# Patient Record
Sex: Female | Born: 1945 | Race: White | Hispanic: No | Marital: Single | Smoking: Former smoker
Health system: Southern US, Community
[De-identification: ages and names within clinical notes are randomized; demographics above are authoritative.]

## PROBLEM LIST (undated history)

## (undated) DIAGNOSIS — C4490 Unspecified malignant neoplasm of skin, unspecified: Secondary | ICD-10-CM

## (undated) DIAGNOSIS — R55 Syncope and collapse: Secondary | ICD-10-CM

## (undated) DIAGNOSIS — F419 Anxiety disorder, unspecified: Secondary | ICD-10-CM

## (undated) DIAGNOSIS — M1990 Unspecified osteoarthritis, unspecified site: Secondary | ICD-10-CM

## (undated) DIAGNOSIS — R569 Unspecified convulsions: Secondary | ICD-10-CM

## (undated) DIAGNOSIS — F32A Depression, unspecified: Secondary | ICD-10-CM

## (undated) DIAGNOSIS — H269 Unspecified cataract: Secondary | ICD-10-CM

## (undated) DIAGNOSIS — I10 Essential (primary) hypertension: Secondary | ICD-10-CM

## (undated) DIAGNOSIS — Z923 Personal history of irradiation: Secondary | ICD-10-CM

## (undated) DIAGNOSIS — F329 Major depressive disorder, single episode, unspecified: Secondary | ICD-10-CM

## (undated) DIAGNOSIS — K219 Gastro-esophageal reflux disease without esophagitis: Secondary | ICD-10-CM

## (undated) DIAGNOSIS — R9082 White matter disease, unspecified: Secondary | ICD-10-CM

## (undated) HISTORY — PX: EYE SURGERY: SHX253

## (undated) HISTORY — DX: Anxiety disorder, unspecified: F41.9

## (undated) HISTORY — DX: Major depressive disorder, single episode, unspecified: F32.9

## (undated) HISTORY — DX: Syncope and collapse: R55

## (undated) HISTORY — DX: White matter disease, unspecified: R90.82

## (undated) HISTORY — DX: Unspecified cataract: H26.9

## (undated) HISTORY — PX: COLONOSCOPY: SHX174

## (undated) HISTORY — DX: Unspecified malignant neoplasm of skin, unspecified: C44.90

## (undated) HISTORY — DX: Depression, unspecified: F32.A

## (undated) HISTORY — PX: BREAST SURGERY: SHX581

## (undated) HISTORY — DX: Unspecified osteoarthritis, unspecified site: M19.90

## (undated) HISTORY — DX: Unspecified convulsions: R56.9

## (undated) HISTORY — DX: Essential (primary) hypertension: I10

---

## 1997-10-12 ENCOUNTER — Other Ambulatory Visit: Admission: RE | Admit: 1997-10-12 | Discharge: 1997-10-12 | Payer: Self-pay | Admitting: Obstetrics and Gynecology

## 1998-06-28 ENCOUNTER — Encounter: Admission: RE | Admit: 1998-06-28 | Discharge: 1998-06-28 | Payer: Self-pay | Admitting: Sports Medicine

## 1998-07-19 ENCOUNTER — Encounter: Admission: RE | Admit: 1998-07-19 | Discharge: 1998-07-19 | Payer: Self-pay | Admitting: Sports Medicine

## 1998-08-18 ENCOUNTER — Encounter: Admission: RE | Admit: 1998-08-18 | Discharge: 1998-08-18 | Payer: Self-pay | Admitting: Family Medicine

## 1998-11-24 ENCOUNTER — Other Ambulatory Visit: Admission: RE | Admit: 1998-11-24 | Discharge: 1998-11-24 | Payer: Self-pay | Admitting: *Deleted

## 2000-02-08 ENCOUNTER — Other Ambulatory Visit: Admission: RE | Admit: 2000-02-08 | Discharge: 2000-02-08 | Payer: Self-pay | Admitting: *Deleted

## 2001-04-09 ENCOUNTER — Other Ambulatory Visit: Admission: RE | Admit: 2001-04-09 | Discharge: 2001-04-09 | Payer: Self-pay | Admitting: *Deleted

## 2002-10-29 ENCOUNTER — Other Ambulatory Visit: Admission: RE | Admit: 2002-10-29 | Discharge: 2002-10-29 | Payer: Self-pay | Admitting: Obstetrics and Gynecology

## 2003-12-25 ENCOUNTER — Emergency Department (HOSPITAL_COMMUNITY): Admission: EM | Admit: 2003-12-25 | Discharge: 2003-12-25 | Payer: Self-pay | Admitting: Emergency Medicine

## 2004-09-06 ENCOUNTER — Ambulatory Visit: Payer: Self-pay | Admitting: Internal Medicine

## 2004-09-12 ENCOUNTER — Ambulatory Visit: Payer: Self-pay

## 2004-10-12 ENCOUNTER — Ambulatory Visit: Payer: Self-pay | Admitting: Gastroenterology

## 2004-10-24 ENCOUNTER — Ambulatory Visit: Payer: Self-pay | Admitting: Gastroenterology

## 2004-11-01 ENCOUNTER — Ambulatory Visit: Payer: Self-pay | Admitting: Internal Medicine

## 2004-11-03 ENCOUNTER — Ambulatory Visit: Payer: Self-pay | Admitting: Internal Medicine

## 2006-01-29 ENCOUNTER — Ambulatory Visit: Payer: Self-pay | Admitting: Internal Medicine

## 2007-10-20 ENCOUNTER — Encounter: Payer: Self-pay | Admitting: Internal Medicine

## 2009-09-30 ENCOUNTER — Encounter (INDEPENDENT_AMBULATORY_CARE_PROVIDER_SITE_OTHER): Payer: Self-pay | Admitting: *Deleted

## 2010-06-13 NOTE — Letter (Signed)
Summary: Colonoscopy Letter  [facility]  [address] [place], [place] [address]   Phone: [phone]  Fax: [phone]      Sep 30, 2009 MRN: [id number]   Marcia Harvey [address] [place], [place]  [address]   Dear Ms. Scaturro,   According to your medical record, it is time for you to schedule a Colonoscopy. The American Cancer Society recommends this procedure as a method to detect early colon cancer. Patients with a family history of colon cancer, or a personal history of colon polyps or inflammatory bowel disease are at increased risk.  This letter has been generated based on the recommendations made at the time of your procedure. If you feel that in your particular situation this may no longer apply, please contact our office.  Please call our office at [phone] to schedule this appointment or to update your records at your earliest convenience.  Thank you for cooperating with [place] to provide you with the very best care possible.   Sincerely,  Barbette Hair. Arlyce Dice, M.D.  [facility] [phone]

## 2010-08-21 IMAGING — CR DG LUMBAR SPINE COMPLETE 4+V
2 series · 5 of 5 positions shown · non-contrast
Comparison: NONE

CLINICAL DATA: Low back pain for 2 months. 

LUMBAR SPINE SERIES

[Series 1: view not recorded · 0.17mm/px · 4 of 4 slices shown (1 of 2)]
[im 1/4]
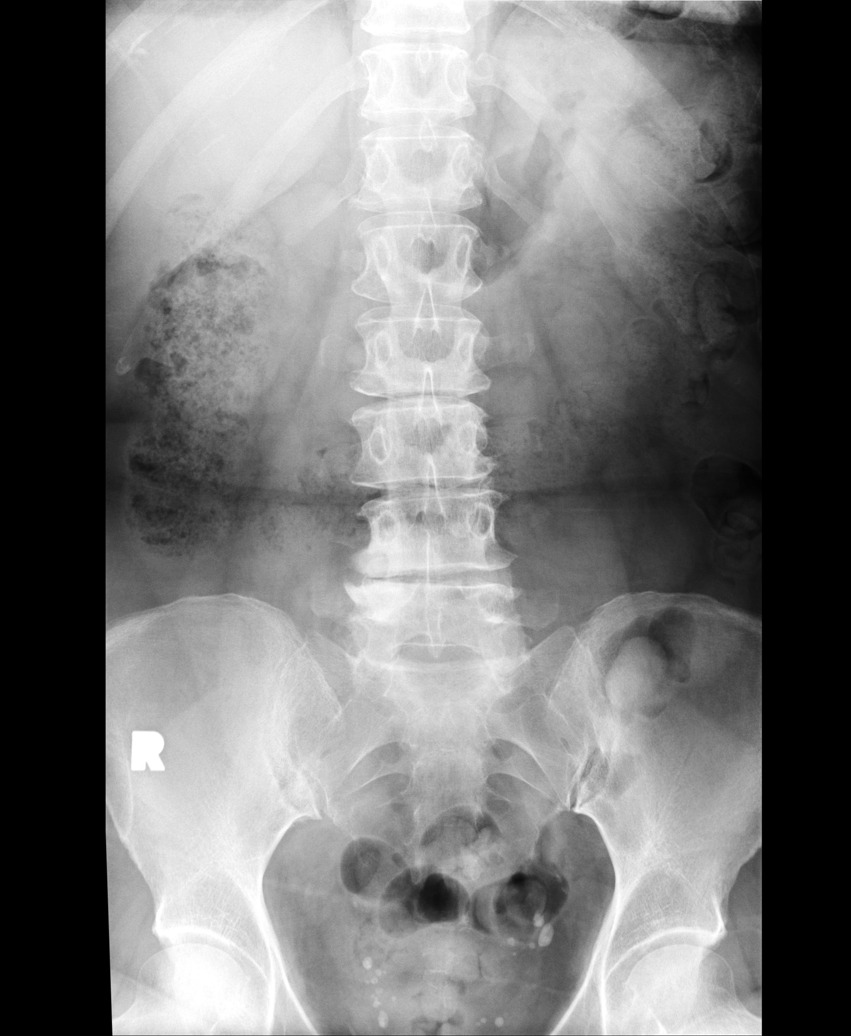
[im 2/4]
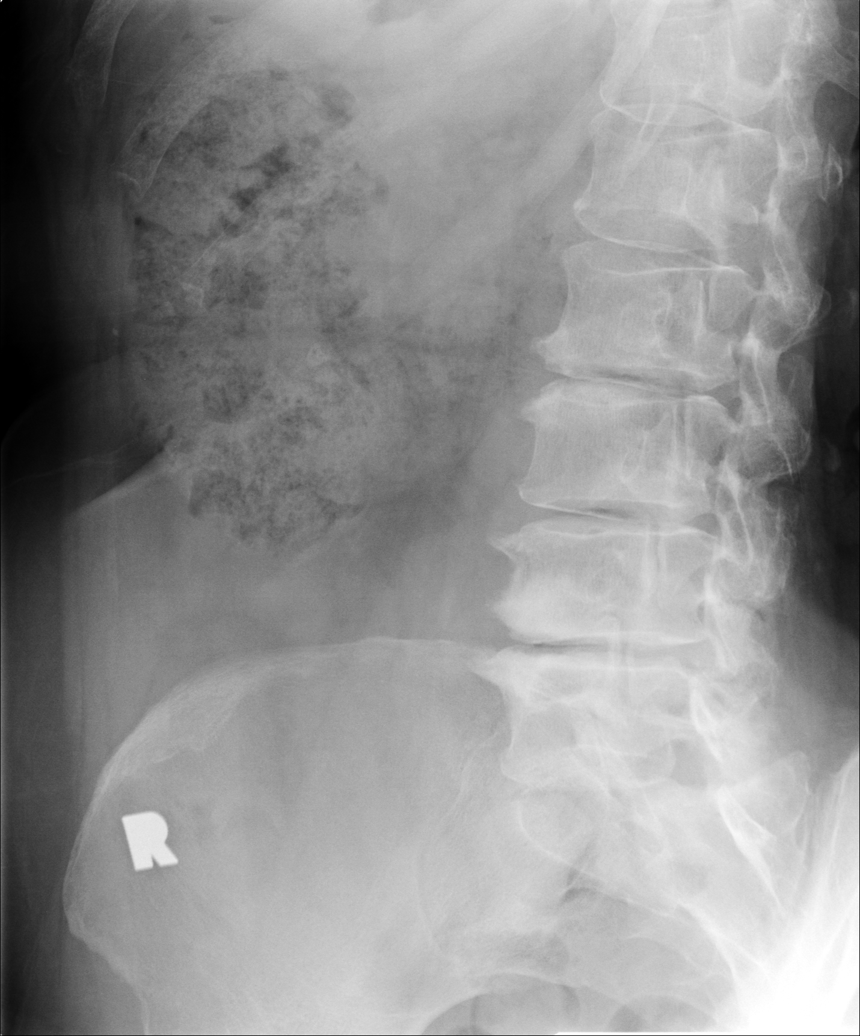
[im 3/4]
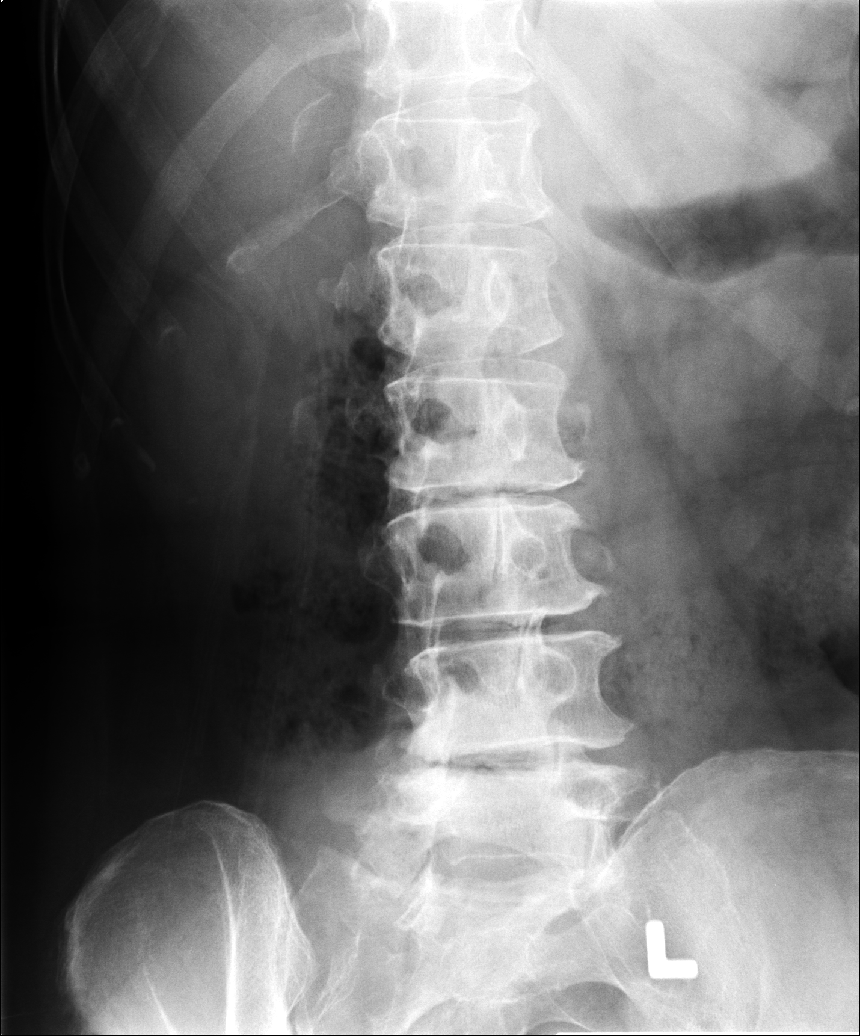
[im 4/4]
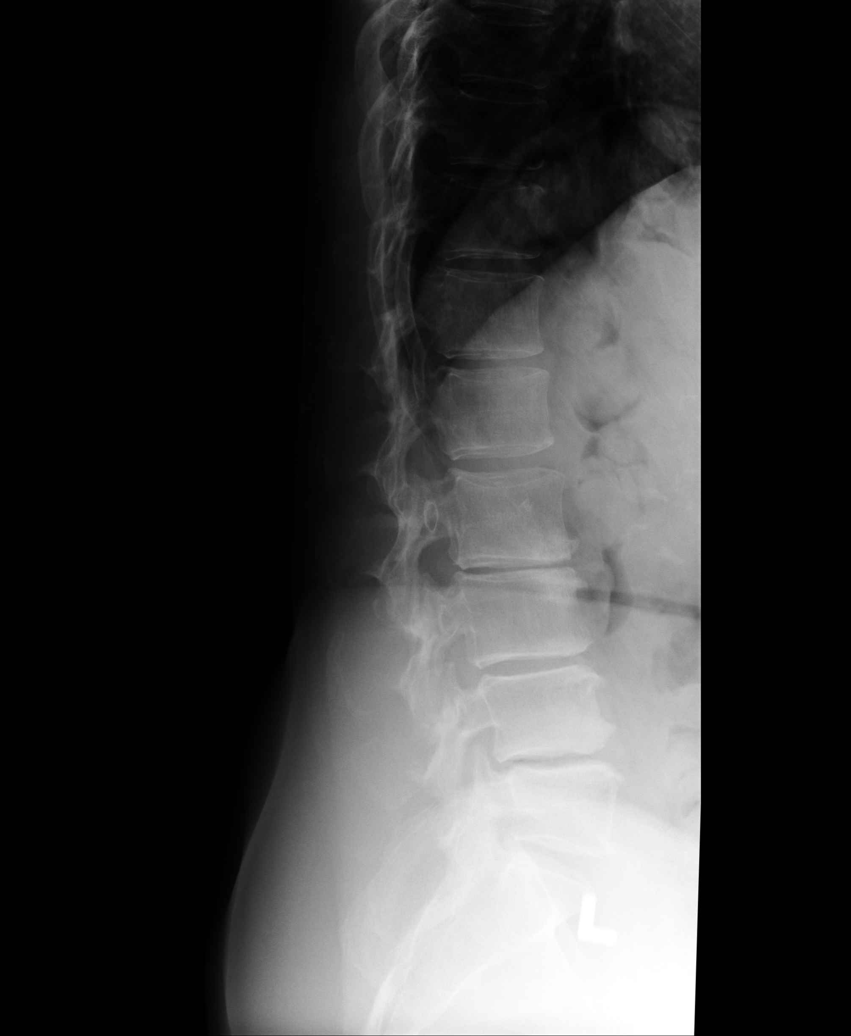

[view not recorded (2 of 2)]
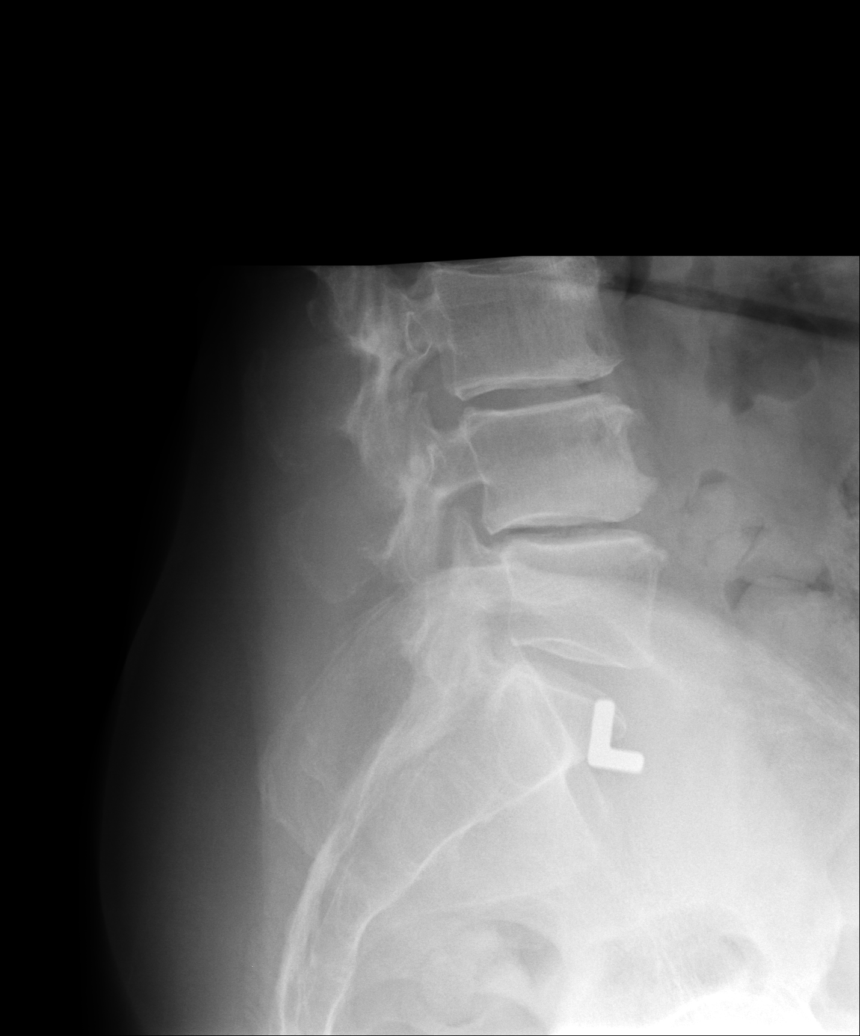

[5 of 5 positions shown; findings below may reference images not displayed]

FINDINGS: Multi-level moderate to marked disc space narrowing is 
present in the lumbar spine. The pedicles are intact. No 
compression fractures, lytic or blastic lesions. No pars defects.
IMPRESSION: Moderate lumbar spondylosis. Lamanna, Tandy 
electronically reviewed on 05/05/2008 Dict Date: 05/05/2008  Tran 
Date:  05/05/2008 DAS  [REDACTED]

## 2010-12-25 ENCOUNTER — Telehealth: Payer: Self-pay | Admitting: *Deleted

## 2010-12-25 NOTE — Telephone Encounter (Signed)
LM FOR PT TO RETURN CALL.  °

## 2011-01-01 NOTE — Telephone Encounter (Signed)
Patient wants to call back in November to schedule her colonoscopy. Said she has been busy and has forgotten about her recall.

## 2011-03-21 ENCOUNTER — Ambulatory Visit (AMBULATORY_SURGERY_CENTER): Payer: Medicare Other | Admitting: *Deleted

## 2011-03-21 VITALS — Ht 64.0 in | Wt 168.9 lb

## 2011-03-21 DIAGNOSIS — Z1211 Encounter for screening for malignant neoplasm of colon: Secondary | ICD-10-CM

## 2011-03-21 MED ORDER — MOVIPREP 100 G PO SOLR: ORAL | Status: DC

## 2011-03-28 ENCOUNTER — Telehealth: Payer: Self-pay | Admitting: Gastroenterology

## 2011-03-28 DIAGNOSIS — Z1211 Encounter for screening for malignant neoplasm of colon: Secondary | ICD-10-CM

## 2011-03-28 MED ORDER — MOVIPREP 100 G PO SOLR: ORAL | Status: DC

## 2011-03-28 NOTE — Telephone Encounter (Signed)
Moviprep reordered and sent to [facility] on [address].  Attempted to reach pt, no ID on machine,unable to leave a message

## 2011-03-28 NOTE — Telephone Encounter (Signed)
Spoke with pt and informed her that Moviprep was resent to her pharmacy

## 2011-04-04 ENCOUNTER — Ambulatory Visit (AMBULATORY_SURGERY_CENTER): Payer: Medicare Other | Admitting: Gastroenterology

## 2011-04-04 ENCOUNTER — Encounter: Payer: Self-pay | Admitting: Gastroenterology

## 2011-04-04 VITALS — BP 149/88 | HR 69 | Temp 96.4°F | Resp 20 | Ht 64.0 in | Wt 168.0 lb

## 2011-04-04 DIAGNOSIS — D126 Benign neoplasm of colon, unspecified: Secondary | ICD-10-CM

## 2011-04-04 DIAGNOSIS — K5730 Diverticulosis of large intestine without perforation or abscess without bleeding: Secondary | ICD-10-CM

## 2011-04-04 DIAGNOSIS — Z1211 Encounter for screening for malignant neoplasm of colon: Secondary | ICD-10-CM

## 2011-04-04 DIAGNOSIS — Z800 Family history of malignant neoplasm of digestive organs: Secondary | ICD-10-CM

## 2011-04-04 MED ORDER — SODIUM CHLORIDE 0.9 % IV SOLN: 500.0000 mL | INTRAVENOUS | Status: DC

## 2011-04-04 NOTE — Progress Notes (Signed)
Patient did not experience any of the following events: a burn prior to discharge; a fall within the facility; wrong site/side/patient/procedure/implant event; or a hospital transfer or hospital admission upon discharge from the facility. (G8907) Patient did not have preoperative order for IV antibiotic SSI prophylaxis. (G8918)  

## 2011-04-04 NOTE — Patient Instructions (Signed)
Please refer to your blue and neon green sheets for instructions regarding diet and activity for the rest of today.  You may resume your medications as you would normally take them.   You will receive a letter in the mail in about two weeks regarding the results of the biopsies taken today.  Diverticulosis Diverticulosis is a common condition that develops when small pouches (diverticula) form in the wall of the colon. The risk of diverticulosis increases with age. It happens more often in people who eat a low-fiber diet. Most individuals with diverticulosis have no symptoms. Those individuals with symptoms usually experience abdominal pain, constipation, or loose stools (diarrhea). HOME CARE INSTRUCTIONS   Increase the amount of fiber in your diet as directed by your caregiver or dietician. This may reduce symptoms of diverticulosis.   Your caregiver may recommend taking a dietary fiber supplement.   Drink at least 6 to 8 glasses of water each day to prevent constipation.   Try not to strain when you have a bowel movement.   Your caregiver may recommend avoiding nuts and seeds to prevent complications, although this is still an uncertain benefit.   Only take over-the-counter or prescription medicines for pain, discomfort, or fever as directed by your caregiver.  FOODS WITH HIGH FIBER CONTENT INCLUDE:  Fruits. Apple, peach, pear, tangerine, raisins, prunes.   Vegetables. Brussels sprouts, asparagus, broccoli, cabbage, carrot, cauliflower, romaine lettuce, spinach, summer squash, tomato, winter squash, zucchini.   Starchy Vegetables. Baked beans, kidney beans, lima beans, split peas, lentils, potatoes (with skin).   Grains. Whole wheat bread, brown rice, bran flake cereal, plain oatmeal, white rice, shredded wheat, bran muffins.  SEEK IMMEDIATE MEDICAL CARE IF:   You develop increasing pain or severe bloating.   You have an oral temperature above 102 F (38.9 C), not controlled by  medicine.   You develop vomiting or bowel movements that are bloody or black.  Document Released: 01/26/2004 Document Revised: 01/10/2011 Document Reviewed: 09/28/2009 ExitCare Patient Information 2012 [employer].  Colon Polyps A polyp is extra tissue that grows inside your body. Colon polyps grow in the large intestine. The large intestine, also called the colon, is part of your digestive system. It is a long, hollow tube at the end of your digestive tract where your body makes and stores stool. Most polyps are not dangerous. They are benign. This means they are not cancerous. But over time, some types of polyps can turn into cancer. Polyps that are smaller than a pea are usually not harmful. But larger polyps could someday become or may already be cancerous. To be safe, doctors remove all polyps and test them.  WHO GETS POLYPS? Anyone can get polyps, but certain people are more likely than others. You may have a greater chance of getting polyps if:  You are over 50.   You have had polyps before.   Someone in your family has had polyps.   Someone in your family has had cancer of the large intestine.   Find out if someone in your family has had polyps. You may also be more likely to get polyps if you:   Eat a lot of fatty foods.   Smoke.   Drink alcohol.   Do not exercise.   Eat too much.  SYMPTOMS  Most small polyps do not cause symptoms. People often do not know they have one until their caregiver finds it during a regular checkup or while testing them for something else. Some people do   have symptoms like these:  Bleeding from the anus. You might notice blood on your underwear or on toilet paper after you have had a bowel movement.   Constipation or diarrhea that lasts more than a week.   Blood in the stool. Blood can make stool look black or it can show up as red streaks in the stool.  If you have any of these symptoms, see your caregiver. HOW DOES THE DOCTOR TEST FOR  POLYPS? The doctor can use four tests to check for polyps:  Digital rectal exam. The caregiver wears gloves and checks your rectum (the last part of the large intestine) to see if it feels normal. This test would find polyps only in the rectum. Your caregiver may need to do one of the other tests listed below to find polyps higher up in the intestine.   Barium enema. The caregiver puts a liquid called barium into your rectum before taking x-rays of your large intestine. Barium makes your intestine look white in the pictures. Polyps are dark, so they are easy to see.   Sigmoidoscopy. With this test, the caregiver can see inside your large intestine. A thin flexible tube is placed into your rectum. The device is called a sigmoidoscope, which has a light and a tiny video camera in it. The caregiver uses the sigmoidoscope to look at the last third of your large intestine.   Colonoscopy. This test is like sigmoidoscopy, but the caregiver looks at all of the large intestine. It usually requires sedation. This is the most common method for finding and removing polyps.  TREATMENT   The caregiver will remove the polyp during sigmoidoscopy or colonoscopy. The polyp is then tested for cancer.   If you have had polyps, your caregiver may want you to get tested regularly in the future.  PREVENTION  There is not one sure way to prevent polyps. You might be able to lower your risk of getting them if you:  Eat more fruits and vegetables and less fatty food.   Do not smoke.   Avoid alcohol.   Exercise every day.   Lose weight if you are overweight.   Eating more calcium and folate can also lower your risk of getting polyps. Some foods that are rich in calcium are milk, cheese, and broccoli. Some foods that are rich in folate are chickpeas, kidney beans, and spinach.   Aspirin might help prevent polyps. Studies are under way.  Document Released: 01/25/2004 Document Revised: 01/10/2011 Document Reviewed:  07/02/2007 ExitCare Patient Information 2012 [employer]. 

## 2011-04-09 ENCOUNTER — Telehealth: Payer: Self-pay | Admitting: *Deleted

## 2011-04-09 NOTE — Telephone Encounter (Signed)
No answer at number given in admitting.  Message left for patient.

## 2011-04-27 ENCOUNTER — Encounter: Payer: Self-pay | Admitting: Gastroenterology

## 2011-04-27 NOTE — Progress Notes (Signed)
Patient ID: Marcia Harvey, female   DOB: 1945-06-28, 65 y.o.   MRN: [id number] f/u colonoscopy in 5 years

## 2011-04-27 NOTE — Progress Notes (Signed)
Patient ID: Marcia Harvey, female   DOB: 06/11/1945, 65 y.o.   MRN: [id number] f/u colonoscopy in 5 years  

## 2011-04-27 NOTE — Progress Notes (Signed)
Patient ID: Marcia Harvey, female   DOB: 11/07/1945, 65 y.o.   MRN: [id number] f/u colonoscopy in 5 years  

## 2012-02-05 ENCOUNTER — Ambulatory Visit
Admission: RE | Admit: 2012-02-05 | Discharge: 2012-02-05 | Disposition: A | Discharge status: Home / Self Care | Payer: Medicare Other | Source: Ambulatory Visit | Attending: Physician Assistant | Admitting: Physician Assistant

## 2012-02-05 ENCOUNTER — Encounter: Payer: Self-pay | Admitting: Family Medicine

## 2012-02-05 ENCOUNTER — Ambulatory Visit (INDEPENDENT_AMBULATORY_CARE_PROVIDER_SITE_OTHER): Payer: Medicare Other | Admitting: Family Medicine

## 2012-02-05 ENCOUNTER — Other Ambulatory Visit: Payer: Self-pay | Admitting: Physician Assistant

## 2012-02-05 VITALS — BP 141/83 | HR 60 | Temp 98.1°F | Resp 18 | Ht 64.0 in | Wt 177.0 lb

## 2012-02-05 DIAGNOSIS — W19XXXA Unspecified fall, initial encounter: Secondary | ICD-10-CM

## 2012-02-05 DIAGNOSIS — M5136 Other intervertebral disc degeneration, lumbar region: Secondary | ICD-10-CM

## 2012-02-05 DIAGNOSIS — R52 Pain, unspecified: Secondary | ICD-10-CM

## 2012-02-05 DIAGNOSIS — S2231XA Fracture of one rib, right side, initial encounter for closed fracture: Secondary | ICD-10-CM

## 2012-02-05 DIAGNOSIS — M5137 Other intervertebral disc degeneration, lumbosacral region: Secondary | ICD-10-CM

## 2012-02-05 DIAGNOSIS — S20229A Contusion of unspecified back wall of thorax, initial encounter: Secondary | ICD-10-CM

## 2012-02-05 DIAGNOSIS — S2239XA Fracture of one rib, unspecified side, initial encounter for closed fracture: Secondary | ICD-10-CM

## 2012-02-05 MED ORDER — PREDNISONE 10 MG PO TABS: ORAL_TABLET | ORAL | Status: DC

## 2012-02-05 NOTE — Progress Notes (Signed)
S; This 66 y.o. Cauc female had a fall from a horse on 01/27/12. She was evaluated at [facility]; CTs were negative except for nondisplaced right 11th rib fracture. She was prescribed Percocet which she has finished. Her pain has increased since the accident, especially beneath her right shoulder blade, along her rib cage and in her lower back. She has known "arthritis" in her lower back. She has been a pt at a local pain management facility for years; she sought evaluation there earlier today. Plain films of lumber spine were ordered and she was advised to follow-up with her PCP. The physician at pain mgmt. Also prescribed Percocet. She has not picked up this med yet.  ROS: LBP with bruising across buttocks esp. on right, difficulty walking and numbness in right buttock. Negative for fever, diaphoresis, CP or tightness, SOB or cough or hemoptysis, GI discomfort, HA, dizziness, weakness or syncope.  OCeasar Mons Vitals:   02/05/12 1441  BP: 141/83  Pulse: 60  Temp: 98.1 F (36.7 C)  Resp: 18   GEN: In mild distress HENT: [place]/AT; EOMI with clear conj/ scl NECK: Supple with good ROM BACK: Tender LS spine with faint ecchymosis over sacrum and right buttock NEURO: A&O x 3; CNs intact; pt cannot stand or walk on toes or heels. Motor/ sensory function grossly intact.  XRAY: Lumbar spine complete done at [facility] progressive lumbar spondylosis   A/P: 1. Contusion of back  Conservative treatment to include rest, moist heat, Percocet, Prednisone taper from 60 mg to 10 mg over next 6 days  2. Fracture of rib of right side   3. DDD (degenerative disc disease), lumbar - chronic, stable  4. Fall from horse

## 2012-02-05 NOTE — Patient Instructions (Signed)
Prednisone taper as directed:  Take 2 tablet 3 times a day on day 1; take 1 less tablet each day. Be sure to take all doses with meal or snack.  Take percocet as prescribed for pain. Take Aleve 1 tablet twice a day for pain. Take with food.

## 2012-02-13 ENCOUNTER — Ambulatory Visit: Payer: Medicare Other | Admitting: Family Medicine

## 2012-09-17 ENCOUNTER — Other Ambulatory Visit: Payer: Self-pay

## 2012-09-17 MED ORDER — LISINOPRIL-HYDROCHLOROTHIAZIDE 10-12.5 MG PO TABS: 1.0000 | ORAL_TABLET | Freq: Every day | ORAL | Status: DC

## 2012-10-31 ENCOUNTER — Other Ambulatory Visit: Payer: Self-pay | Admitting: Physician Assistant

## 2012-10-31 NOTE — Telephone Encounter (Signed)
Needs OV, labs 

## 2013-01-16 ENCOUNTER — Other Ambulatory Visit: Payer: Self-pay | Admitting: Physician Assistant

## 2013-01-27 ENCOUNTER — Other Ambulatory Visit: Payer: Self-pay | Admitting: Physician Assistant

## 2013-11-02 ENCOUNTER — Ambulatory Visit (INDEPENDENT_AMBULATORY_CARE_PROVIDER_SITE_OTHER): Payer: Medicare Other | Admitting: Podiatry

## 2013-11-02 ENCOUNTER — Ambulatory Visit (INDEPENDENT_AMBULATORY_CARE_PROVIDER_SITE_OTHER): Payer: Medicare Other

## 2013-11-02 ENCOUNTER — Encounter: Payer: Self-pay | Admitting: Podiatry

## 2013-11-02 VITALS — BP 122/65 | HR 75 | Resp 16

## 2013-11-02 DIAGNOSIS — L84 Corns and callosities: Secondary | ICD-10-CM

## 2013-11-02 DIAGNOSIS — R52 Pain, unspecified: Secondary | ICD-10-CM

## 2013-11-02 DIAGNOSIS — M779 Enthesopathy, unspecified: Secondary | ICD-10-CM

## 2013-11-02 MED ORDER — TRIAMCINOLONE ACETONIDE 10 MG/ML IJ SUSP
10.0000 mg | Freq: Once | INTRAMUSCULAR | Status: AC
  Administered 2013-11-02: 10 mg

## 2013-11-02 NOTE — Progress Notes (Signed)
   Subjective:    Patient ID: Marcia Harvey, female    DOB: 10-Feb-1946, 68 y.o.   MRN: [id number]  HPI Pt states that she has excruciating pain in her left foot for 3 weeks and is unable to walk correctly, possible plantar wart. She also stated that her right foot is chronically sore and hurts on the outside, 5th toe region and has acute flare ups that can be painful   Review of Systems     Objective:   Physical Exam        Assessment & Plan:

## 2013-11-02 NOTE — Progress Notes (Signed)
Subjective:     Patient ID: Marcia Harvey, female   DOB: [birth year], 68 y.o.   MRN: [id number]  Foot Pain   patient presents with pain outside of fifth metatarsal left foot with what feels like fluid buildup and pain in the base of the fifth metatarsals of both feet sporadic in its nature   Review of Systems  All other systems reviewed and are negative.      Objective:   Physical Exam  Nursing note and vitals reviewed. Constitutional: She is oriented to person, place, and time.  Cardiovascular: Intact distal pulses.   Musculoskeletal: Normal range of motion.  Neurological: She is oriented to person, place, and time.  Skin: Skin is warm.   neurovascular status found to be intact with muscle strength adequate and range of motion subtalar midtarsal joint within normal limits. Mild discomfort base of fifth metatarsal of both feet with peroneal muscle group function good and low grade as far as inflammation toes. Head of fifth metatarsal left is quite inflamed with fluid buildup and keratotic plantar lesion     Assessment:     Capsulitis of the left fifth MPJ with keratotic lesion and pain    Plan:     H&P and x-rays reviewed. Today capsular injection 3 mg Kenalog 5 of vesica Marcaine mixture and debridement lesions fully with sterile dressing application. Reappoint if symptoms continue either here or in base of the metatarsals

## 2014-06-07 ENCOUNTER — Encounter (HOSPITAL_COMMUNITY): Payer: Self-pay | Admitting: *Deleted

## 2014-06-07 ENCOUNTER — Emergency Department (HOSPITAL_COMMUNITY)
Admission: EM | Admit: 2014-06-07 | Discharge: 2014-06-08 | Disposition: A | Discharge status: Home / Self Care | Payer: Medicare Other | Attending: Emergency Medicine | Admitting: Emergency Medicine

## 2014-06-07 DIAGNOSIS — Z7982 Long term (current) use of aspirin: Secondary | ICD-10-CM | POA: Diagnosis not present

## 2014-06-07 DIAGNOSIS — R55 Syncope and collapse: Secondary | ICD-10-CM | POA: Insufficient documentation

## 2014-06-07 DIAGNOSIS — M1990 Unspecified osteoarthritis, unspecified site: Secondary | ICD-10-CM | POA: Insufficient documentation

## 2014-06-07 DIAGNOSIS — Z79899 Other long term (current) drug therapy: Secondary | ICD-10-CM | POA: Insufficient documentation

## 2014-06-07 DIAGNOSIS — Z87891 Personal history of nicotine dependence: Secondary | ICD-10-CM | POA: Diagnosis not present

## 2014-06-07 DIAGNOSIS — F419 Anxiety disorder, unspecified: Secondary | ICD-10-CM | POA: Insufficient documentation

## 2014-06-07 DIAGNOSIS — F329 Major depressive disorder, single episode, unspecified: Secondary | ICD-10-CM | POA: Diagnosis not present

## 2014-06-07 DIAGNOSIS — I10 Essential (primary) hypertension: Secondary | ICD-10-CM | POA: Diagnosis not present

## 2014-06-07 LAB — CBC
HEMATOCRIT: 41.3 % (ref 36.0–46.0)
Hemoglobin: 13.8 g/dL (ref 12.0–15.0)
MCH: 28 pg (ref 26.0–34.0)
MCHC: 33.4 g/dL (ref 30.0–36.0)
MCV: 83.9 fL (ref 78.0–100.0)
Platelets: 273 10*3/uL (ref 150–400)
RBC: 4.92 MIL/uL (ref 3.87–5.11)
RDW: 13.3 % (ref 11.5–15.5)
WBC: 8.6 10*3/uL (ref 4.0–10.5)

## 2014-06-07 LAB — BASIC METABOLIC PANEL
Anion gap: 10 (ref 5–15)
BUN: 20 mg/dL (ref 6–23)
CHLORIDE: 104 mmol/L (ref 96–112)
CO2: 27 mmol/L (ref 19–32)
Calcium: 9.4 mg/dL (ref 8.4–10.5)
Creatinine, Ser: 1.39 mg/dL — ABNORMAL HIGH (ref 0.50–1.10)
GFR calc Af Amer: 43 mL/min — ABNORMAL LOW (ref >90)
GFR, EST NON AFRICAN AMERICAN: 37 mL/min — AB (ref >90)
GLUCOSE: 119 mg/dL — AB (ref 70–99)
Potassium: 3.8 mmol/L (ref 3.5–5.1)
Sodium: 141 mmol/L (ref 135–145)

## 2014-06-07 LAB — I-STAT CHEM 8, ED
BUN: 23 mg/dL (ref 6–23)
CALCIUM ION: 1.17 mmol/L (ref 1.13–1.30)
Chloride: 103 mmol/L (ref 96–112)
Creatinine, Ser: 1.3 mg/dL — ABNORMAL HIGH (ref 0.50–1.10)
Glucose, Bld: 122 mg/dL — ABNORMAL HIGH (ref 70–99)
HCT: 44 % (ref 36.0–46.0)
Hemoglobin: 15 g/dL (ref 12.0–15.0)
Potassium: 3.8 mmol/L (ref 3.5–5.1)
Sodium: 142 mmol/L (ref 135–145)
TCO2: 24 mmol/L (ref 0–100)

## 2014-06-07 LAB — I-STAT TROPONIN, ED: TROPONIN I, POC: 0 ng/mL (ref 0.00–0.08)

## 2014-06-07 NOTE — ED Notes (Signed)
Pt to ED from home via [facility] c/o syncopal episode at home. Friend reports pt was eating dinner and passed out for ~30seconds. Pt has hx of the same in the past. Initial blood pressure 72 palpated. Pt a&o x4 on arrival

## 2014-06-07 NOTE — ED Provider Notes (Signed)
CSN: [id number]     Arrival date & time 06/07/14  2131 History   First MD Initiated Contact with Patient 06/07/14 2224     Chief Complaint  Patient presents with  . Loss of Consciousness     (Consider location/radiation/quality/duration/timing/severity/associated sxs/prior Treatment) HPI Comments: After dinner.  Patient was seen to have 3 syncopal episodes versus seizures lasting less than 30 seconds witnessed by friends who know the patient well.  States that she's had these episodes in the past which a result of dehydration.  She has a history of having one seizure in the past, which was questionable after head injury.  She never played and placed on any medication.  She's had multiple EEGs.  Neurologic and cardiac evaluations, all within normal parameters, resulting in the diagnosis of anxiety.  Patient states she has not felt particularly anxious.  She's been eating well.  She's had no recent illnesses at this time.  She is back at her baseline.  There was no period of confusion after the episodes.  Her friend at bedside.  Describes the episodes as involving the upper body only.  Patient is a 69 y.o. female presenting with syncope. The history is provided by the patient and a friend.  Loss of Consciousness Episode history:  Multiple Most recent episode:  Today Timing:  Intermittent Progression:  Resolved Chronicity:  Recurrent Context: dehydration   Witnessed: yes   Relieved by:  None tried Worsened by:  Nothing tried Ineffective treatments:  None tried Associated symptoms: seizures   Associated symptoms: no anxiety, no chest pain, no confusion, no dizziness, no fever, no focal sensory loss, no focal weakness, no headaches, no nausea, no palpitations, no recent fall, no recent injury, no shortness of breath and no visual change     Past Medical History  Diagnosis Date  . Hypertension   . Anxiety   . Anxiety   . Depression   . Arthritis   . Seizures   . Fall from horse 01/27/12     CT chest/Abd/Pelvis done at [facility]. Findings: Nondisplaced fx of posterior right 11th rib  . Syncopal episodes     seizure with episode   Past Surgical History  Procedure Laterality Date  . Colonoscopy     Family History  Problem Relation Age of Onset  . Colon cancer Mother   . Diabetes Maternal Grandfather   . Diabetes Paternal Grandmother    History  Substance Use Topics  . Smoking status: Former Smoker    Types: Cigarettes    Quit date: 05/21/2003  . Smokeless tobacco: Never Used  . Alcohol Use: No   OB History    No data available     Review of Systems  Constitutional: Negative for fever.  Respiratory: Negative for shortness of breath.   Cardiovascular: Positive for syncope. Negative for chest pain and palpitations.  Gastrointestinal: Negative for nausea.  Neurological: Positive for seizures and syncope. Negative for dizziness, focal weakness and headaches.  Psychiatric/Behavioral: Negative for confusion.  All other systems reviewed and are negative.     Allergies  Codeine; Iohexol; and Iodine  Home Medications   Prior to Admission medications   Medication Sig Start Date End Date Taking? Authorizing Provider  aspirin EC 81 MG tablet Take 81 mg by mouth daily.   Yes Historical Provider, MD  b complex vitamins tablet Take 1 tablet by mouth daily.     Yes Historical Provider, MD  buPROPion (WELLBUTRIN XL) 150 MG 24 hr tablet Take 150 mg  by mouth Daily. 02/25/11  Yes Historical Provider, MD  Cholecalciferol (VITAMIN D3) 2000 UNITS TABS Take 2,000 Units by mouth daily.   Yes Historical Provider, MD  lisinopril-hydrochlorothiazide (PRINZIDE,ZESTORETIC) 10-12.5 MG per tablet Take 1 tablet by mouth daily. NEED VISIT, LABS!!  2nd NOTICE! 10/31/12  Yes Theda Sers, PA-C  Multiple Vitamin (MULTIVITAMIN) capsule Take 1 capsule by mouth daily.     Yes Historical Provider, MD  pravastatin (PRAVACHOL) 40 MG tablet Take 40 mg by mouth daily.   Yes Historical  Provider, MD  zolpidem (AMBIEN) 10 MG tablet Take 5 mg by mouth at bedtime as needed for sleep.    Yes Historical Provider, MD   BP 124/77 mmHg  Pulse 75  Temp(Src) 97.9 F (36.6 C) (Oral)  Resp 16  Ht 5\' 4"  (1.626 m)  Wt 180 lb (81.647 kg)  BMI 30.88 kg/m2  SpO2 98% Physical Exam  Constitutional: She is oriented to person, place, and time. She appears well-developed and well-nourished.  HENT:  Head: Normocephalic and atraumatic.  Mouth/Throat: Oropharynx is clear and moist.  Eyes: Pupils are equal, round, and reactive to light.  Neck: Normal range of motion.  Cardiovascular: Normal rate and regular rhythm.   Pulmonary/Chest: Effort normal.  Abdominal: Soft. Bowel sounds are normal.  Musculoskeletal: She exhibits no edema or tenderness.  Neurological: She is alert and oriented to person, place, and time.  Skin: Skin is warm and dry.  Psychiatric: She has a normal mood and affect.  Nursing note and vitals reviewed.   ED Course  Procedures (including critical care time) Labs Review Labs Reviewed  BASIC METABOLIC PANEL - Abnormal; Notable for the following:    Glucose, Bld 119 (*)    Creatinine, Ser 1.39 (*)    GFR calc non Af Amer 37 (*)    GFR calc Af Amer 43 (*)    All other components within normal limits  I-STAT CHEM 8, ED - Abnormal; Notable for the following:    Creatinine, Ser 1.30 (*)    Glucose, Bld 122 (*)    All other components within normal limits  CBC  CBG MONITORING, ED  I-STAT TROPOININ, ED    Imaging Review No results found.   EKG Interpretation   Date/Time:  Monday June 07 2014 21:42:46 EST Ventricular Rate:  70 PR Interval:  169 QRS Duration: 80 QT Interval:  409 QTC Calculation: 441 R Axis:   46 Text Interpretation:  Sinus rhythm Probable left atrial enlargement Low  voltage, precordial leads No old tracing to compare Confirmed by BELFI   MD, MELANIE ([id number]) on 06/07/2014 11:39:13 PM      MDM   Final diagnoses:  Syncope, non  cardiac         Garald Balding, NP 06/08/14 0031  Malvin Johns, MD 06/14/14 1510

## 2014-06-08 LAB — CBG MONITORING, ED: GLUCOSE-CAPILLARY: 118 mg/dL — AB (ref 70–99)

## 2014-06-08 NOTE — Discharge Instructions (Signed)
Follow-up with your PCP.

## 2014-06-08 NOTE — ED Notes (Signed)
Dr. Belfi at bedside 

## 2014-10-13 ENCOUNTER — Ambulatory Visit (INDEPENDENT_AMBULATORY_CARE_PROVIDER_SITE_OTHER): Payer: Medicare Other

## 2014-10-13 ENCOUNTER — Ambulatory Visit (INDEPENDENT_AMBULATORY_CARE_PROVIDER_SITE_OTHER): Payer: Medicare Other | Admitting: Podiatry

## 2014-10-13 ENCOUNTER — Encounter: Payer: Self-pay | Admitting: Podiatry

## 2014-10-13 VITALS — Ht 64.0 in | Wt 175.0 lb

## 2014-10-13 DIAGNOSIS — M779 Enthesopathy, unspecified: Secondary | ICD-10-CM

## 2014-10-13 DIAGNOSIS — L84 Corns and callosities: Secondary | ICD-10-CM | POA: Diagnosis not present

## 2014-10-13 DIAGNOSIS — M7661 Achilles tendinitis, right leg: Secondary | ICD-10-CM | POA: Diagnosis not present

## 2014-10-13 MED ORDER — TRIAMCINOLONE ACETONIDE 10 MG/ML IJ SUSP
10.0000 mg | Freq: Once | INTRAMUSCULAR | Status: AC
Start: 2014-10-13 — End: 2014-10-13
  Administered 2014-10-13: 10 mg

## 2014-10-13 NOTE — Progress Notes (Signed)
Subjective:     Patient ID: Marcia Harvey, female   DOB: 1946-02-22, 69 y.o.   MRN: [id number]  HPI patient presents stating I'm having a lot of pain on the head of my fifth metatarsal left and I started to develop pain in my right Achilles tendon as I have become quite active playing tennis again   Review of Systems     Objective:   Physical Exam Neurovascular status intact muscle strength adequate with thorough evaluation of Achilles tendon right showing no indications of tearing of the tendon or fluid buildup. Left fifth MPJ shows keratotic lesion with fluid buildup around the head of the fifth metatarsal    Assessment:     Inflammatory capsulitis fifth MPJ left foot plantar with lesion formation and Achilles tendinitis right    Plan:     Reviewed both conditions and advised on physical therapy for the right with instructions on stretching exercises. For the left I injected the fifth MPJ 3 mg of dexamethasone Kenalog 5 mg Xylocaine and did deep sharp debridement of lesion with no iatrogenic bleeding noted. Reappoint to recheck as needed

## 2014-10-13 NOTE — Progress Notes (Signed)
   Subjective:    Patient ID: Marcia Harvey, female    DOB: 04-Aug-1945, 69 y.o.   MRN: [id number]  HPI    Review of Systems  All other systems reviewed and are negative.      Objective:   Physical Exam        Assessment & Plan:

## 2014-10-13 NOTE — Patient Instructions (Signed)

## 2014-11-03 ENCOUNTER — Encounter: Payer: Self-pay | Admitting: Gastroenterology

## 2015-06-06 ENCOUNTER — Ambulatory Visit (INDEPENDENT_AMBULATORY_CARE_PROVIDER_SITE_OTHER): Payer: Medicare Other | Admitting: Podiatry

## 2015-06-06 DIAGNOSIS — L84 Corns and callosities: Secondary | ICD-10-CM | POA: Diagnosis not present

## 2015-06-06 DIAGNOSIS — M779 Enthesopathy, unspecified: Secondary | ICD-10-CM | POA: Diagnosis not present

## 2015-06-07 NOTE — Progress Notes (Signed)
Subjective:     Patient ID: Marcia Harvey, female   DOB: 1945-08-09, 70 y.o.   MRN: [id number]  HPI patient states I'm having a lot of pain around this joint with fluid buildup and lesion formation   Review of Systems     Objective:   Physical Exam  neurovascular status intact with inflammatory changes left fifth MPJ with pain and keratotic lesion formation    Assessment:      inflammatory capsulitis left fifth MPJ with lesion formation    Plan:      injected around the joint surface 3 mg dexamethasone Kenalog 5 mg Xylocaine and then debrided the lesion fully applied sterile dressing and reappoint as needed with ultimate bone surgery that may be necessary

## 2016-02-09 ENCOUNTER — Encounter: Payer: Self-pay | Admitting: Gastroenterology

## 2016-02-21 ENCOUNTER — Encounter: Payer: Self-pay | Admitting: Gastroenterology

## 2016-07-01 ENCOUNTER — Other Ambulatory Visit: Payer: Self-pay | Admitting: Radiology

## 2019-07-24 ENCOUNTER — Ambulatory Visit (INDEPENDENT_AMBULATORY_CARE_PROVIDER_SITE_OTHER): Payer: Medicare PPO

## 2019-07-24 ENCOUNTER — Encounter: Payer: Self-pay | Admitting: Orthopaedic Surgery

## 2019-07-24 ENCOUNTER — Other Ambulatory Visit: Payer: Self-pay

## 2019-07-24 ENCOUNTER — Ambulatory Visit: Payer: Medicare PPO | Admitting: Orthopaedic Surgery

## 2019-07-24 VITALS — BP 144/88 | HR 89 | Ht 64.0 in | Wt 180.0 lb

## 2019-07-24 DIAGNOSIS — M545 Low back pain: Secondary | ICD-10-CM

## 2019-07-24 DIAGNOSIS — M542 Cervicalgia: Secondary | ICD-10-CM | POA: Diagnosis not present

## 2019-07-24 DIAGNOSIS — G8929 Other chronic pain: Secondary | ICD-10-CM

## 2019-07-24 DIAGNOSIS — M47812 Spondylosis without myelopathy or radiculopathy, cervical region: Secondary | ICD-10-CM | POA: Diagnosis not present

## 2019-07-24 DIAGNOSIS — M5136 Other intervertebral disc degeneration, lumbar region: Secondary | ICD-10-CM | POA: Diagnosis not present

## 2019-07-27 DIAGNOSIS — M5136 Other intervertebral disc degeneration, lumbar region: Secondary | ICD-10-CM | POA: Insufficient documentation

## 2019-07-27 DIAGNOSIS — M47812 Spondylosis without myelopathy or radiculopathy, cervical region: Secondary | ICD-10-CM | POA: Insufficient documentation
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
## 2019-12-04 LAB — COLOGUARD: COLOGUARD: NEGATIVE

## 2020-05-04 ENCOUNTER — Other Ambulatory Visit: Payer: Self-pay | Admitting: Radiology

## 2020-06-06 ENCOUNTER — Ambulatory Visit: Payer: Self-pay | Admitting: Surgery

## 2020-06-06 DIAGNOSIS — N6082 Other benign mammary dysplasias of left breast: Secondary | ICD-10-CM

## 2020-06-06 NOTE — H&P (View-Only) (Signed)
History of Present Illness Marcia Harvey. Marcia Rankin MD; 06/06/2020 11:43 AM) The patient is a 75 year old female who presents with a breast mass. Referred by Dr. Bernerd Harvey for left breast mass  This is a 75 year old female who presents after recent screening mammogram revealed an area of calcifications in the central left breast at middle depth. Subsequently she underwent diagnostic mammogram which showed a 3 cm area of linear calcifications in the central left breast. She underwent stereotactic biopsy on 05/04/20. The biopsy revealed flat epithelial atypia arising in a sclerotic lesion. The patient is now referred to [place] for evaluation for excision.  The patient denies any family history of breast cancer. She denies any previous history of breast problems except for a benign biopsy in her right breast many years ago.   Problem List/Past Medical Marcia Key K. 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 Cataract Surgery Bilateral. Foot Surgery Bilateral. Hemorrhoidectomy Oral Surgery  Diagnostic Studies History Marcia Harvey, CMA; 06/06/2020 9:47 AM) Colonoscopy 5-10 years ago Mammogram within last year Pap Smear 1-5 years ago  Allergies Marcia Harvey, CMA; 06/06/2020 9:48 AM) Codeine Sulfate *ANALGESICS - OPIOID* Hives. Iodinated Diagnostic Agents Anaphylaxis.  Medication History Marcia Harvey, CMA; 06/06/2020 9:49 AM) Lisinopril-hydroCHLOROthiazide (20-25MG  Tablet, Oral) Active. Pravastatin Sodium (40MG  Tablet, Oral) Active. Omeprazole (40MG  Capsule DR, Oral) Active. Sertraline HCl (25MG  Tablet, Oral) Active. traZODone HCl (50MG  Tablet, Oral) Active. Medications Reconciled  Social History Marcia Harvey, CMA; 06/06/2020 9:47 AM) Caffeine use Carbonated beverages, Coffee, Tea. No drug use Tobacco use Former smoker.  Family History  Marcia Harvey, CMA; 06/06/2020 9:47 AM) Arthritis Father. Colon Cancer Mother. Colon Polyps Mother. Heart Disease Brother, Father. Hypertension Mother. Prostate Cancer Father. Thyroid problems Mother.  Pregnancy / Birth History Marcia Harvey, CMA; 06/06/2020 9:47 AM) Age at menarche 28 years. Age of menopause 72-55 Gravida 0 Para 0  Other Problems Marcia Harvey. Marcia Rog, MD; 06/06/2020 11:43 AM) Anxiety Disorder Arthritis Back Pain Depression Gastroesophageal Reflux Disease Hemorrhoids High blood pressure Hypercholesterolemia Migraine Headache Other disease, cancer, significant illness    Vitals (Marcia Harvey CMA; 06/06/2020 9:47 AM) 06/06/2020 9:47 AM Weight: 178.8 lb Height: 63in Body Surface Area: 1.84 m Body Mass Index: 31.67 kg/m  BP: 110/80(Sitting, Left Arm, Standard)        Physical Exam Marcia Key K. Malasia Torain MD; 06/06/2020 11:43 AM)  The physical exam findings are as follows: Note:Constitutional: WDWN in NAD, conversant, no obvious deformities; resting comfortably Eyes: Pupils equal, round; sclera anicteric; moist conjunctiva; no lid lag HENT: Oral mucosa moist; good dentition Neck: No masses palpated, trachea midline; no thyromegaly Lungs: CTA bilaterally; normal respiratory effort CV: Regular rate and rhythm; no murmurs; extremities well-perfused with no edema Breast: Symmetrical, no nipple changes, no palpable masses, no axillary lymphadenopathy. Abd: +bowel sounds, soft, non-tender, no palpable organomegaly; no palpable hernias Musc: Normal gait; no apparent clubbing or cyanosis in extremities Lymphatic: No palpable cervical or axillary lymphadenopathy Skin: Warm, dry; no sign of jaundice Psychiatric - alert and oriented x 4; calm mood and affect    Assessment & Plan Marcia Key K. 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 The surgical procedure has been discussed with the patient. Potential risks, benefits, alternative treatments, and expected outcomes have been explained. All of the patient's questions at this time have been answered.  The likelihood of reaching the patient's treatment goal is good. The patient understand the proposed surgical procedure and wishes to proceed.  Marcia Harvey. Marcia Dover, MD, [facility]  General/ Trauma Surgery   06/06/2020 11:44 AM

## 2020-06-06 NOTE — H&P (Signed)
History of Present Illness Marcia Harvey. Marcia Egger MD; 06/06/2020 11:43 AM) The patient is a 75 year old female who presents with a breast mass. Referred by Dr. Bernerd Limbo for left breast mass  This is a 75 year old female who presents after recent screening mammogram revealed an area of calcifications in the central left breast at middle depth. Subsequently she underwent diagnostic mammogram which showed a 3 cm area of linear calcifications in the central left breast. She underwent stereotactic biopsy on 05/04/20. The biopsy revealed flat epithelial atypia arising in a sclerotic lesion. The patient is now referred to [place] for evaluation for excision.  The patient denies any family history of breast cancer. She denies any previous history of breast problems except for a benign biopsy in her right breast many years ago.   Problem List/Past Medical Rodman Key K. 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 Cataract Surgery Bilateral. Foot Surgery Bilateral. Hemorrhoidectomy Oral Surgery  Diagnostic Studies History [name], CMA; 06/06/2020 9:47 AM) Colonoscopy 5-10 years ago Mammogram within last year Pap Smear 1-5 years ago  Allergies [name], CMA; 06/06/2020 9:48 AM) Codeine Sulfate *ANALGESICS - OPIOID* Hives. Iodinated Diagnostic Agents Anaphylaxis.  Medication History [name], CMA; 06/06/2020 9:49 AM) Lisinopril-hydroCHLOROthiazide (20-25MG  Tablet, Oral) Active. Pravastatin Sodium (40MG  Tablet, Oral) Active. Omeprazole (40MG  Capsule DR, Oral) Active. Sertraline HCl (25MG  Tablet, Oral) Active. traZODone HCl (50MG  Tablet, Oral) Active. Medications Reconciled  Social History [name], CMA; 06/06/2020 9:47 AM) Caffeine use Carbonated beverages, Coffee, Tea. No drug use Tobacco use Former smoker.  Family History  [name], CMA; 06/06/2020 9:47 AM) Arthritis Father. Colon Cancer Mother. Colon Polyps Mother. Heart Disease Brother, Father. Hypertension Mother. Prostate Cancer Father. Thyroid problems Mother.  Pregnancy / Birth History [name], CMA; 06/06/2020 9:47 AM) Age at menarche 28 years. Age of menopause 73-55 Gravida 0 Para 0  Other Problems Marcia Harvey. Emi Lymon, MD; 06/06/2020 11:43 AM) Anxiety Disorder Arthritis Back Pain Depression Gastroesophageal Reflux Disease Hemorrhoids High blood pressure Hypercholesterolemia Migraine Headache Other disease, cancer, significant illness    Vitals ([name] CMA; 06/06/2020 9:47 AM) 06/06/2020 9:47 AM Weight: 178.8 lb Height: 63in Body Surface Area: 1.84 m Body Mass Index: 31.67 kg/m  BP: 110/80(Sitting, Left Arm, Standard)        Physical Exam Rodman Key K. Sharion Grieves MD; 06/06/2020 11:43 AM)  The physical exam findings are as follows: Note:Constitutional: WDWN in NAD, conversant, no obvious deformities; resting comfortably Eyes: Pupils equal, round; sclera anicteric; moist conjunctiva; no lid lag HENT: Oral mucosa moist; good dentition Neck: No masses palpated, trachea midline; no thyromegaly Lungs: CTA bilaterally; normal respiratory effort CV: Regular rate and rhythm; no murmurs; extremities well-perfused with no edema Breast: Symmetrical, no nipple changes, no palpable masses, no axillary lymphadenopathy. Abd: +bowel sounds, soft, non-tender, no palpable organomegaly; no palpable hernias Musc: Normal gait; no apparent clubbing or cyanosis in extremities Lymphatic: No palpable cervical or axillary lymphadenopathy Skin: Warm, dry; no sign of jaundice Psychiatric - alert and oriented x 4; calm mood and affect    Assessment & Plan Rodman Key K. 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 The surgical procedure has been discussed with the patient. Potential risks, benefits, alternative treatments, and expected outcomes have been explained. All of the patient's questions at this time have been answered.  The likelihood of reaching the patient's treatment goal is good. The patient understand the proposed surgical procedure and wishes to proceed.  Zahid Carneiro K. Jeffre Enriques, MD, FACS [facility]  General/ Trauma Surgery   06/06/2020 11:44 AM  

## 2020-06-14 ENCOUNTER — Encounter (HOSPITAL_BASED_OUTPATIENT_CLINIC_OR_DEPARTMENT_OTHER): Payer: Self-pay | Admitting: Surgery

## 2020-06-14 ENCOUNTER — Other Ambulatory Visit: Payer: Self-pay

## 2020-06-17 ENCOUNTER — Encounter (HOSPITAL_BASED_OUTPATIENT_CLINIC_OR_DEPARTMENT_OTHER)
Admission: RE | Admit: 2020-06-17 | Discharge: 2020-06-17 | Disposition: A | Discharge status: Home / Self Care | Payer: 59 | Source: Ambulatory Visit | Attending: Surgery | Admitting: Surgery

## 2020-06-17 ENCOUNTER — Other Ambulatory Visit (HOSPITAL_COMMUNITY)
Admission: RE | Admit: 2020-06-17 | Discharge: 2020-06-17 | Disposition: A | Discharge status: Home / Self Care | Payer: 59 | Source: Ambulatory Visit | Attending: Surgery | Admitting: Surgery

## 2020-06-17 DIAGNOSIS — Z01818 Encounter for other preprocedural examination: Secondary | ICD-10-CM | POA: Insufficient documentation

## 2020-06-17 DIAGNOSIS — Z20822 Contact with and (suspected) exposure to covid-19: Secondary | ICD-10-CM | POA: Insufficient documentation

## 2020-06-17 DIAGNOSIS — Z01812 Encounter for preprocedural laboratory examination: Secondary | ICD-10-CM | POA: Insufficient documentation

## 2020-06-17 LAB — BASIC METABOLIC PANEL
Anion gap: 10 (ref 5–15)
BUN: 29 mg/dL — ABNORMAL HIGH (ref 8–23)
CO2: 23 mmol/L (ref 22–32)
Calcium: 9.3 mg/dL (ref 8.9–10.3)
Chloride: 104 mmol/L (ref 98–111)
Creatinine, Ser: 0.9 mg/dL (ref 0.44–1.00)
GFR, Estimated: >60 mL/min (ref >60)
Glucose, Bld: 94 mg/dL (ref 70–99)
Potassium: 3.5 mmol/L (ref 3.5–5.1)
Sodium: 137 mmol/L (ref 135–145)

## 2020-06-17 LAB — SARS CORONAVIRUS 2 (TAT 6-24 HRS): SARS Coronavirus 2: NEGATIVE

## 2020-06-17 NOTE — Progress Notes (Signed)

## 2020-06-21 ENCOUNTER — Encounter (HOSPITAL_BASED_OUTPATIENT_CLINIC_OR_DEPARTMENT_OTHER): Payer: Self-pay | Admitting: Surgery

## 2020-06-21 ENCOUNTER — Encounter (HOSPITAL_BASED_OUTPATIENT_CLINIC_OR_DEPARTMENT_OTHER)
Admission: RE | Disposition: A | Discharge status: Home / Self Care | Payer: Self-pay | Source: Home / Self Care | Attending: Surgery

## 2020-06-21 ENCOUNTER — Other Ambulatory Visit: Payer: Self-pay

## 2020-06-21 ENCOUNTER — Ambulatory Visit (HOSPITAL_BASED_OUTPATIENT_CLINIC_OR_DEPARTMENT_OTHER): Payer: Medicare PPO | Admitting: Anesthesiology

## 2020-06-21 ENCOUNTER — Ambulatory Visit (HOSPITAL_BASED_OUTPATIENT_CLINIC_OR_DEPARTMENT_OTHER)
Admission: RE | Admit: 2020-06-21 | Discharge: 2020-06-21 | Disposition: A | Discharge status: Home / Self Care | Payer: Medicare PPO | Attending: Surgery | Admitting: Surgery

## 2020-06-21 DIAGNOSIS — Z91041 Radiographic dye allergy status: Secondary | ICD-10-CM | POA: Diagnosis not present

## 2020-06-21 DIAGNOSIS — Z8371 Family history of colonic polyps: Secondary | ICD-10-CM | POA: Insufficient documentation

## 2020-06-21 DIAGNOSIS — Z8249 Family history of ischemic heart disease and other diseases of the circulatory system: Secondary | ICD-10-CM | POA: Insufficient documentation

## 2020-06-21 DIAGNOSIS — Z87891 Personal history of nicotine dependence: Secondary | ICD-10-CM | POA: Insufficient documentation

## 2020-06-21 DIAGNOSIS — Z800 Family history of malignant neoplasm of digestive organs: Secondary | ICD-10-CM | POA: Diagnosis not present

## 2020-06-21 DIAGNOSIS — Z8349 Family history of other endocrine, nutritional and metabolic diseases: Secondary | ICD-10-CM | POA: Insufficient documentation

## 2020-06-21 DIAGNOSIS — Z885 Allergy status to narcotic agent status: Secondary | ICD-10-CM | POA: Insufficient documentation

## 2020-06-21 DIAGNOSIS — N6022 Fibroadenosis of left breast: Secondary | ICD-10-CM | POA: Diagnosis not present

## 2020-06-21 DIAGNOSIS — Z79899 Other long term (current) drug therapy: Secondary | ICD-10-CM | POA: Diagnosis not present

## 2020-06-21 DIAGNOSIS — N6489 Other specified disorders of breast: Secondary | ICD-10-CM | POA: Diagnosis present

## 2020-06-21 DIAGNOSIS — N62 Hypertrophy of breast: Secondary | ICD-10-CM | POA: Insufficient documentation

## 2020-06-21 DIAGNOSIS — Z8042 Family history of malignant neoplasm of prostate: Secondary | ICD-10-CM | POA: Insufficient documentation

## 2020-06-21 HISTORY — PX: BREAST LUMPECTOMY WITH RADIOACTIVE SEED LOCALIZATION: SHX6424

## 2020-06-21 HISTORY — DX: Gastro-esophageal reflux disease without esophagitis: K21.9

## 2020-06-21 SURGERY — BREAST LUMPECTOMY WITH RADIOACTIVE SEED LOCALIZATION
Anesthesia: General | Site: Breast | Laterality: Left

## 2020-06-21 MED ORDER — OXYCODONE HCL 5 MG PO TABS
ORAL_TABLET | ORAL | Status: AC
  Filled 2020-06-21: qty 1

## 2020-06-21 MED ORDER — EPHEDRINE 5 MG/ML INJ
INTRAVENOUS | Status: AC
  Filled 2020-06-21: qty 10

## 2020-06-21 MED ORDER — FENTANYL CITRATE (PF) 100 MCG/2ML IJ SOLN
INTRAMUSCULAR | Status: AC
  Filled 2020-06-21: qty 2

## 2020-06-21 MED ORDER — DEXAMETHASONE SODIUM PHOSPHATE 10 MG/ML IJ SOLN
INTRAMUSCULAR | Status: DC | PRN
  Administered 2020-06-21: 8 mg via INTRAVENOUS

## 2020-06-21 MED ORDER — ACETAMINOPHEN 500 MG PO TABS
ORAL_TABLET | ORAL | Status: AC
  Filled 2020-06-21: qty 2

## 2020-06-21 MED ORDER — PROPOFOL 10 MG/ML IV BOLUS
INTRAVENOUS | Status: DC | PRN
  Administered 2020-06-21: 150 mg via INTRAVENOUS

## 2020-06-21 MED ORDER — ACETAMINOPHEN 500 MG PO TABS
1000.0000 mg | ORAL_TABLET | ORAL | Status: AC
  Administered 2020-06-21: 1000 mg via ORAL

## 2020-06-21 MED ORDER — CEFAZOLIN SODIUM-DEXTROSE 2-4 GM/100ML-% IV SOLN
2.0000 g | INTRAVENOUS | Status: AC
  Administered 2020-06-21: 2 g via INTRAVENOUS

## 2020-06-21 MED ORDER — ONDANSETRON HCL 4 MG/2ML IJ SOLN
INTRAMUSCULAR | Status: DC | PRN
Start: 2020-06-21 — End: 2020-06-21
  Administered 2020-06-21: 4 mg via INTRAVENOUS

## 2020-06-21 MED ORDER — OXYCODONE HCL 5 MG PO TABS
5.0000 mg | ORAL_TABLET | ORAL | Status: DC | PRN
  Administered 2020-06-21: 5 mg via ORAL

## 2020-06-21 MED ORDER — CEFAZOLIN SODIUM-DEXTROSE 2-4 GM/100ML-% IV SOLN
INTRAVENOUS | Status: AC
  Filled 2020-06-21: qty 100

## 2020-06-21 MED ORDER — GABAPENTIN 300 MG PO CAPS
ORAL_CAPSULE | ORAL | Status: AC
  Filled 2020-06-21: qty 1

## 2020-06-21 MED ORDER — FENTANYL CITRATE (PF) 100 MCG/2ML IJ SOLN
25.0000 ug | INTRAMUSCULAR | Status: DC | PRN
  Administered 2020-06-21: 25 ug via INTRAVENOUS
  Administered 2020-06-21: 50 ug via INTRAVENOUS

## 2020-06-21 MED ORDER — AMISULPRIDE (ANTIEMETIC) 5 MG/2ML IV SOLN: 10.0000 mg | Freq: Once | INTRAVENOUS | Status: DC | PRN

## 2020-06-21 MED ORDER — GABAPENTIN 300 MG PO CAPS
300.0000 mg | ORAL_CAPSULE | ORAL | Status: AC
  Administered 2020-06-21: 300 mg via ORAL

## 2020-06-21 MED ORDER — BUPIVACAINE-EPINEPHRINE 0.25% -1:200000 IJ SOLN
INTRAMUSCULAR | Status: DC | PRN
  Administered 2020-06-21: 10 mL

## 2020-06-21 MED ORDER — CHLORHEXIDINE GLUCONATE CLOTH 2 % EX PADS: 6.0000 | MEDICATED_PAD | Freq: Once | CUTANEOUS | Status: DC

## 2020-06-21 MED ORDER — FENTANYL CITRATE (PF) 100 MCG/2ML IJ SOLN
INTRAMUSCULAR | Status: DC | PRN
  Administered 2020-06-21 (×2): 25 ug via INTRAVENOUS

## 2020-06-21 MED ORDER — LIDOCAINE HCL (CARDIAC) PF 100 MG/5ML IV SOSY
PREFILLED_SYRINGE | INTRAVENOUS | Status: DC | PRN
  Administered 2020-06-21: 80 mg via INTRAVENOUS

## 2020-06-21 MED ORDER — EPHEDRINE SULFATE 50 MG/ML IJ SOLN
INTRAMUSCULAR | Status: DC | PRN
  Administered 2020-06-21: 15 mg via INTRAVENOUS

## 2020-06-21 MED ORDER — ONDANSETRON HCL 4 MG/2ML IJ SOLN: 4.0000 mg | Freq: Once | INTRAMUSCULAR | Status: DC | PRN

## 2020-06-21 MED ORDER — LACTATED RINGERS IV SOLN: INTRAVENOUS | Status: DC

## 2020-06-21 SURGICAL SUPPLY — 49 items
APL PRP STRL LF DISP 70% ISPRP (MISCELLANEOUS) ×1
APL SKNCLS STERI-STRIP NONHPOA (GAUZE/BANDAGES/DRESSINGS) ×1
APPLIER CLIP 9.375 MED OPEN (MISCELLANEOUS) ×2
APR CLP MED 9.3 20 MLT OPN (MISCELLANEOUS) ×1
BENZOIN TINCTURE PRP APPL 2/3 (GAUZE/BANDAGES/DRESSINGS) ×2 IMPLANT
BLADE HEX COATED 2.75 (ELECTRODE) ×2 IMPLANT
BLADE SURG 15 STRL LF DISP TIS (BLADE) ×1 IMPLANT
BLADE SURG 15 STRL SS (BLADE) ×2
CANISTER SUC SOCK COL 7IN (MISCELLANEOUS) IMPLANT
CANISTER SUCT 1200ML W/VALVE (MISCELLANEOUS) IMPLANT
CHLORAPREP W/TINT 26 (MISCELLANEOUS) ×2 IMPLANT
CLIP APPLIE 9.375 MED OPEN (MISCELLANEOUS) ×1 IMPLANT
COVER BACK TABLE 60X90IN (DRAPES) ×2 IMPLANT
COVER MAYO STAND STRL (DRAPES) ×2 IMPLANT
COVER PROBE W GEL 5X96 (DRAPES) ×2 IMPLANT
DECANTER SPIKE VIAL GLASS SM (MISCELLANEOUS) IMPLANT
DRAPE LAPAROTOMY 100X72 PEDS (DRAPES) ×2 IMPLANT
DRAPE UTILITY XL STRL (DRAPES) ×2 IMPLANT
DRSG TEGADERM 4X4.75 (GAUZE/BANDAGES/DRESSINGS) ×2 IMPLANT
ELECT REM PT RETURN 9FT ADLT (ELECTROSURGICAL) ×2
ELECTRODE REM PT RTRN 9FT ADLT (ELECTROSURGICAL) ×1 IMPLANT
GAUZE SPONGE 4X4 12PLY STRL LF (GAUZE/BANDAGES/DRESSINGS) ×2 IMPLANT
GLOVE SURG ENC MOIS LTX SZ7 (GLOVE) ×2 IMPLANT
GLOVE SURG SS PI 7.5 STRL IVOR (GLOVE) ×2 IMPLANT
GLOVE SURG UNDER POLY LF SZ7 (GLOVE) ×2 IMPLANT
GLOVE SURG UNDER POLY LF SZ7.5 (GLOVE) ×2 IMPLANT
GOWN STRL REUS W/ TWL LRG LVL3 (GOWN DISPOSABLE) ×2 IMPLANT
GOWN STRL REUS W/TWL LRG LVL3 (GOWN DISPOSABLE) ×4
ILLUMINATOR WAVEGUIDE N/F (MISCELLANEOUS) IMPLANT
KIT MARKER MARGIN INK (KITS) ×2 IMPLANT
LIGHT WAVEGUIDE WIDE FLAT (MISCELLANEOUS) IMPLANT
NEEDLE HYPO 25X1 1.5 SAFETY (NEEDLE) ×2 IMPLANT
NS IRRIG 1000ML POUR BTL (IV SOLUTION) ×2 IMPLANT
PACK BASIN DAY SURGERY FS (CUSTOM PROCEDURE TRAY) ×2 IMPLANT
PENCIL SMOKE EVACUATOR (MISCELLANEOUS) ×2 IMPLANT
SLEEVE SCD COMPRESS KNEE MED (MISCELLANEOUS) ×2 IMPLANT
SPONGE GAUZE 2X2 8PLY STRL LF (GAUZE/BANDAGES/DRESSINGS) IMPLANT
SPONGE LAP 4X18 RFD (DISPOSABLE) ×2 IMPLANT
STRIP CLOSURE SKIN 1/2X4 (GAUZE/BANDAGES/DRESSINGS) ×2 IMPLANT
SUT MON AB 4-0 PC3 18 (SUTURE) ×2 IMPLANT
SUT SILK 2 0 SH (SUTURE) IMPLANT
SUT VIC AB 3-0 SH 27 (SUTURE) ×2
SUT VIC AB 3-0 SH 27X BRD (SUTURE) ×1 IMPLANT
SYR BULB EAR ULCER 3OZ GRN STR (SYRINGE) IMPLANT
SYR CONTROL 10ML LL (SYRINGE) ×2 IMPLANT
TOWEL GREEN STERILE FF (TOWEL DISPOSABLE) ×2 IMPLANT
TRAY FAXITRON CT DISP (TRAY / TRAY PROCEDURE) ×2 IMPLANT
TUBE CONNECTING 20X1/4 (TUBING) IMPLANT
YANKAUER SUCT BULB TIP NO VENT (SUCTIONS) IMPLANT

## 2020-06-21 NOTE — Anesthesia Preprocedure Evaluation (Addendum)
Anesthesia Evaluation  Patient identified by MRN, date of birth, ID band Patient awake    Reviewed: Allergy & Precautions, H&P , NPO status , Patient's Chart, lab work & pertinent test results  Airway Mallampati: I  TM Distance: >3 FB Neck ROM: Full    Dental no notable dental hx.    Pulmonary neg pulmonary ROS, former smoker,    Pulmonary exam normal breath sounds clear to auscultation       Cardiovascular hypertension, Normal cardiovascular exam Rhythm:Regular Rate:Normal  Normal sinus rhythm Normal ECG since last tracing no significant change Confirmed by Larae Grooms [phone]) on 06/17/2020 10:27:10 PM   Neuro/Psych PSYCHIATRIC DISORDERS Anxiety Depression Cervical spondylosis  Neuromuscular disease (radiculopathy)    GI/Hepatic Neg liver ROS, GERD  ,  Endo/Other  negative endocrine ROS  Renal/GU negative Renal ROS  negative genitourinary   Musculoskeletal  (+) Arthritis ,   Abdominal Normal abdominal exam  (+)   Peds negative pediatric ROS (+)  Hematology negative hematology ROS (+)   Anesthesia Other Findings   Reproductive/Obstetrics negative OB ROS                            Anesthesia Physical Anesthesia Plan  ASA: III  Anesthesia Plan: General   Post-op Pain Management:    Induction: Intravenous  PONV Risk Score and Plan: 3  Airway Management Planned: LMA  Additional Equipment: None  Intra-op Plan:   Post-operative Plan: Extubation in OR  Informed Consent: I have reviewed the patients History and Physical, chart, labs and discussed the procedure including the risks, benefits and alternatives for the proposed anesthesia with the patient or authorized representative who has indicated his/her understanding and acceptance.     Dental advisory given  Plan Discussed with: CRNA, Anesthesiologist and Surgeon  Anesthesia Plan Comments:         Anesthesia  Quick Evaluation

## 2020-06-21 NOTE — Anesthesia Postprocedure Evaluation (Signed)
Anesthesia Post Note  Patient: Marcia Harvey  Procedure(s) Performed: LEFT BREAST LUMPECTOMY WITH RADIOACTIVE SEED LOCALIZATION (Left Breast)     Patient location during evaluation: PACU Anesthesia Type: General Level of consciousness: awake and alert Pain management: pain level controlled Vital Signs Assessment: post-procedure vital signs reviewed and stable Respiratory status: spontaneous breathing and respiratory function stable Cardiovascular status: stable Postop Assessment: no apparent nausea or vomiting Anesthetic complications: no   No complications documented.  Last Vitals:  Vitals:   06/21/20 1115 06/21/20 1146  BP: 127/73 136/70  Pulse: 79 74  Resp: 13 18  Temp:  36.6 C  SpO2: 97% 98%    Last Pain:  Vitals:   06/21/20 1200  TempSrc:   PainSc: [place]

## 2020-06-21 NOTE — Discharge Instructions (Signed)
Next dose of Tylenol can be given after 2:30PM.     Post Anesthesia Home Care Instructions  Activity: Get plenty of rest for the remainder of the day. A responsible individual must stay with you for 24 hours following the procedure.  For the next 24 hours, DO NOT: -Drive a car -Paediatric nurse -Drink alcoholic beverages -Take any medication unless instructed by your physician -Make any legal decisions or sign important papers.  Meals: Start with liquid foods such as gelatin or soup. Progress to regular foods as tolerated. Avoid greasy, spicy, heavy foods. If nausea and/or vomiting occur, drink only clear liquids until the nausea and/or vomiting subsides. Call your physician if vomiting continues.  Special Instructions/Symptoms: Your throat may feel dry or sore from the anesthesia or the breathing tube placed in your throat during surgery. If this causes discomfort, gargle with warm salt water. The discomfort should disappear within 24 hours.  If you had a scopolamine patch placed behind your ear for the management of post- operative nausea and/or vomiting:  1. The medication in the patch is effective for 72 hours, after which it should be removed.  Wrap patch in a tissue and discard in the trash. Wash hands thoroughly with soap and water. 2. You may remove the patch earlier than 72 hours if you experience unpleasant side effects which may include dry mouth, dizziness or visual disturbances. 3. Avoid touching the patch. Wash your hands with soap and water after contact with the patch.          [place] Office Phone Number [phone]  BREAST BIOPSY/ PARTIAL MASTECTOMY: POST OP INSTRUCTIONS  Always review your discharge instruction sheet given to you by the facility where your surgery was performed.  IF YOU HAVE DISABILITY OR FAMILY LEAVE FORMS, YOU MUST BRING THEM TO THE OFFICE FOR PROCESSING.  DO NOT GIVE THEM TO YOUR DOCTOR.  1. A prescription for  pain medication may be given to you upon discharge.  Take your pain medication as prescribed, if needed.  If narcotic pain medicine is not needed, then you may take acetaminophen (Tylenol) or ibuprofen (Advil) as needed. 2. Take your usually prescribed medications unless otherwise directed 3. If you need a refill on your pain medication, please contact your pharmacy.  They will contact our office to request authorization.  Prescriptions will not be filled after 5pm or on week-ends. 4. You should eat very light the first 24 hours after surgery, such as soup, crackers, pudding, etc.  Resume your normal diet the day after surgery. 5. Most patients will experience some swelling and bruising in the breast.  Ice packs and a good support bra will help.  Swelling and bruising can take several days to resolve.  6. It is common to experience some constipation if taking pain medication after surgery.  Increasing fluid intake and taking a stool softener will usually help or prevent this problem from occurring.  A mild laxative (Milk of Magnesia or Miralax) should be taken according to package directions if there are no bowel movements after 48 hours. 7. Unless discharge instructions indicate otherwise, you may remove your bandages 24-48 hours after surgery, and you may shower at that time.  You may have steri-strips (small skin tapes) in place directly over the incision.  These strips should be left on the skin for 7-10 days.  If your surgeon used skin glue on the incision, you may shower in 24 hours.  The glue will flake off over the next 2-3  weeks.  Any sutures or staples will be removed at the office during your follow-up visit. 8. ACTIVITIES:  You may resume regular daily activities (gradually increasing) beginning the next day.  Wearing a good support bra or sports bra minimizes pain and swelling.  You may have sexual intercourse when it is comfortable. a. You may drive when you no longer are taking prescription  pain medication, you can comfortably wear a seatbelt, and you can safely maneuver your car and apply brakes. b. RETURN TO WORK:  ______________________________________________________________________________________ 9. You should see your doctor in the office for a follow-up appointment approximately two weeks after your surgery.  Your doctors nurse will typically make your follow-up appointment when she calls you with your pathology report.  Expect your pathology report 2-3 business days after your surgery.  You may call to check if you do not hear from [place] after three days. 10. OTHER INSTRUCTIONS: _______________________________________________________________________________________________ _____________________________________________________________________________________________________________________________________ _____________________________________________________________________________________________________________________________________ _____________________________________________________________________________________________________________________________________  WHEN TO CALL YOUR DOCTOR: 1. Fever over 101.0 2. Nausea and/or vomiting. 3. Extreme swelling or bruising. 4. Continued bleeding from incision. 5. Increased pain, redness, or drainage from the incision.  The clinic staff is available to answer your questions during regular business hours.  Please dont hesitate to call and ask to speak to one of the nurses for clinical concerns.  If you have a medical emergency, go to the nearest emergency room or call 911.  A surgeon from [facility] is always on call at the hospital.  For further questions, please visit [email or web address]

## 2020-06-21 NOTE — Op Note (Signed)
Pre-op Diagnosis:  Left breast mass with calcifications Post-op Diagnosis: same Procedure:  Left radioactive seed localized lumpectomy Surgeon:  Lynnet Hefley K. Anesthesia:  GEN - LMA Indications: This is a 75 year old female who presents after recent screening mammogram revealed an area of calcifications in the central left breast at middle depth. Subsequently she underwent diagnostic mammogram which showed a 3 cm area of linear calcifications in the central left breast. She underwent stereotactic biopsy on 05/04/20. The biopsy revealed flat epithelial atypia arising in a sclerotic lesion. The patient is now referred to [place] for evaluation for excision.  The patient denies any family history of breast cancer. She denies any previous history of breast problems except for a benign biopsy in her right breast many years ago.   Description of procedure: The patient is brought to the operating room placed in supine position on the operating room table. After an adequate level of general anesthesia was obtained, her left breast was prepped with ChloraPrep and draped in sterile fashion. A timeout was taken to ensure the proper patient and proper procedure. We interrogated the breast with the neoprobe. We made a circumareolar incision around the lateral side of the nipple after infiltrating with 0.25% Marcaine. Dissection was carried down in the breast tissue with cautery. We used the neoprobe to guide [place] towards the radioactive seed. We excised an area of tissue around the radioactive seed 3 x 2 cm. The specimen was removed and was oriented with a paint kit. Specimen mammogram showed the radioactive seed as well as the biopsy clip within the specimen. This was sent for pathologic examination. There is no residual radioactivity within the biopsy cavity. We inspected carefully for hemostasis. The wound was thoroughly irrigated. The wound was closed with a deep layer of 3-0 Vicryl and a subcuticular layer of  4-0 Monocryl. Benzoin Steri-Strips were applied. The patient was then extubated and brought to the recovery room in stable condition. All sponge, instrument, and needle counts are correct.  Marcia Harvey. Marcia Dover, MD, [facility]  General/ Trauma Surgery  06/21/2020 10:34 AM

## 2020-06-21 NOTE — Interval H&P Note (Signed)
History and Physical Interval Note:  06/21/2020 8:10 AM  Marcia Harvey  has presented today for surgery, with the diagnosis of LEFT BREAST FLAT EPITHELIAL ATYPIA.  The various methods of treatment have been discussed with the patient and family. After consideration of risks, benefits and other options for treatment, the patient has consented to  Procedure(s) with comments: LEFT BREAST LUMPECTOMY WITH RADIOACTIVE SEED LOCALIZATION (Left) - LMA as a surgical intervention.  The patient's history has been reviewed, patient examined, no change in status, stable for surgery.  I have reviewed the patient's chart and labs.  Questions were answered to the patient's satisfaction.     Maia Petties

## 2020-06-21 NOTE — Anesthesia Procedure Notes (Signed)
Procedure Name: LMA Insertion Date/Time: 06/21/2020 9:53 AM Performed by: Ezequiel Kayser, CRNA Pre-anesthesia Checklist: Patient identified, Emergency Drugs available, Suction available and Patient being monitored Patient Re-evaluated:Patient Re-evaluated prior to induction Oxygen Delivery Method: Circle System Utilized Preoxygenation: Pre-oxygenation with 100% oxygen Induction Type: IV induction Ventilation: Mask ventilation without difficulty LMA: LMA inserted LMA Size: 4.0 Number of attempts: 1 Airway Equipment and Method: Bite block Placement Confirmation: positive ETCO2 Tube secured with: Tape Dental Injury: Teeth and Oropharynx as per pre-operative assessment  Comments: Eyes taped prior to insertion

## 2020-06-21 NOTE — Transfer of Care (Signed)
Immediate Anesthesia Transfer of Care Note  Patient: Marcia Harvey  Procedure(s) Performed: LEFT BREAST LUMPECTOMY WITH RADIOACTIVE SEED LOCALIZATION (Left Breast)  Patient Location: PACU  Anesthesia Type:General  Level of Consciousness: drowsy  Airway & Oxygen Therapy: Patient Spontanous Breathing and Patient connected to face mask oxygen  Post-op Assessment: Report given to RN and Post -op Vital signs reviewed and stable  Post vital signs: Reviewed and stable  Last Vitals:  Vitals Value Taken Time  BP 126/70 06/21/20 1036  Temp    Pulse 76 06/21/20 1037  Resp 11 06/21/20 1037  SpO2 100 % 06/21/20 1037  Vitals shown include unvalidated device data.  Last Pain:  Vitals:   06/21/20 0829  TempSrc: Oral  PainSc: 0-No pain      Patients Stated Pain Goal: 4 (07/57/32 2567)  Complications: No complications documented.

## 2020-06-22 ENCOUNTER — Encounter (HOSPITAL_BASED_OUTPATIENT_CLINIC_OR_DEPARTMENT_OTHER): Payer: Self-pay | Admitting: Surgery

## 2020-06-23 LAB — SURGICAL PATHOLOGY

## 2020-07-12 DIAGNOSIS — C801 Malignant (primary) neoplasm, unspecified: Secondary | ICD-10-CM

## 2020-07-12 HISTORY — DX: Malignant (primary) neoplasm, unspecified: C80.1

## 2020-07-25 ENCOUNTER — Ambulatory Visit: Payer: Self-pay | Admitting: Surgery

## 2020-07-25 NOTE — H&P (Signed)
History of Present Illness Marcia Harvey. Marcia Outten MD; 07/25/2020 3:21 PM) The patient is a 75 year old female who presents with a skin neoplasm. Referred by Dr. Bernerd Limbo for left breast mass  This is a 75 year old female who presents after recent screening mammogram revealed an area of calcifications in the central left breast at middle depth. Subsequently she underwent diagnostic mammogram which showed a 3 cm area of linear calcifications in the central left breast. She underwent stereotactic biopsy on 05/04/20. The biopsy revealed flat epithelial atypia arising in a sclerotic lesion. The patient is now referred to [place] for evaluation for excision.  The patient denies any family history of breast cancer. She denies any previous history of breast problems except for a benign biopsy in her right breast many years ago.  On 06/21/20 she underwent left breast lumpectomy. This showed a complex sclerosing lesion with no sign of malignancy. She is healing well from this area.  Recently she had a skin biopsy of a suspicious lesion on her upper right chest over her clavicle. 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 She presents now for wide excision.   Problem List/Past Medical Rodman Key K. 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 Jhayla Podgorski, MD; 07/25/2020 3:21 PM) Breast Biopsy Bilateral. Cataract Surgery Bilateral. Foot Surgery Bilateral. Hemorrhoidectomy Oral Surgery  Diagnostic Studies History (Lacrecia Delval K. Ronette Hank, MD; 07/25/2020 3:21 PM) Colonoscopy 5-10 years ago Mammogram within last year Pap Smear 1-5 years ago  Allergies Rodman Key K. Courtland Reas, MD; 07/25/2020 3:21 PM) Paper Tape 1"x10yd *MEDICAL DEVICES AND SUPPLIES* Codeine Sulfate *ANALGESICS - OPIOID* Hives. Iodinated Diagnostic Agents Anaphylaxis.  Medication History Marcia Harvey.  Ralpheal Zappone, MD; 07/25/2020 3:21 PM) Lisinopril-hydroCHLOROthiazide (20-25MG  Tablet, Oral) Active. Pravastatin Sodium (40MG  Tablet, Oral) Active. Omeprazole (40MG  Capsule DR, Oral) Active. Sertraline HCl (25MG  Tablet, Oral) Active. traZODone HCl (50MG  Tablet, Oral) Active.  Social History Marcia Harvey. Long Brimage, MD; 07/25/2020 3:21 PM) Caffeine use Carbonated beverages, Coffee, Tea. No drug use Tobacco use Former smoker.  Family History Marcia Harvey. Jaena Brocato, MD; 07/25/2020 3:21 PM) Arthritis Father. Colon Cancer Mother. Colon Polyps Mother. Heart Disease Brother, Father. Hypertension Mother. Prostate Cancer Father. Thyroid problems Mother.  Pregnancy / Birth History Marcia Harvey. Elysia Grand, MD; 07/25/2020 3:21 PM) Age at menarche 17 years. Age of menopause 29-55 Gravida 0 Para 0  Other Problems Marcia Harvey. Alastor Kneale, MD; 07/25/2020 3:21 PM) Anxiety Disorder Arthritis Back Pain Depression Gastroesophageal Reflux Disease Hemorrhoids High blood pressure Hypercholesterolemia Migraine Headache Other disease, cancer, significant illness     Physical Exam Rodman Key K. Quinto Tippy MD; 07/25/2020 3:21 PM)  The physical exam findings are as follows: Note:Constitutional: WDWN in NAD, conversant, no obvious deformities; resting comfortably Eyes: Pupils equal, round; sclera anicteric; moist conjunctiva; no lid lag HENT: Oral mucosa moist; good dentition Neck: No masses palpated, trachea midline; no thyromegaly Lungs: CTA bilaterally; normal respiratory effort Her left circumareolar incision is healing well with no sign of infection. Firm scar tissue under the incision. The right upper chest biopsy site seems mildly inflamed but no clear sign of infection. No bleeding or drainage. CV: Regular rate and rhythm; no murmurs; extremities well-perfused with no edema Abd: +bowel sounds, soft, non-tender, no palpable organomegaly; no palpable hernias Musc: Normal gait; no apparent clubbing  or cyanosis in extremities Lymphatic: No palpable cervical or axillary lymphadenopathy Skin: Warm, dry; no sign of jaundice Psychiatric - alert and oriented x 4; calm  mood and affect    Assessment & Plan Marcia Harvey. 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 The surgical procedure has been discussed with the patient. Potential risks, benefits, alternative treatments, and expected outcomes have been explained. All of the patient's questions at this time have been answered. The likelihood of reaching the patient's treatment goal is good. The patient understand the proposed surgical procedure and wishes to proceed.  Marcia Harvey. Georgette Dover, MD, [facility]  General/ Trauma Surgery   07/25/2020 3:22 PM

## 2020-07-25 NOTE — H&P (View-Only) (Signed)
History of Present Illness Marcia Harvey. Jyssica Rief MD; 07/25/2020 3:21 PM) The patient is a 75 year old female who presents with a skin neoplasm. Referred by Dr. Bernerd Limbo for left breast mass  This is a 75 year old female who presents after recent screening mammogram revealed an area of calcifications in the central left breast at middle depth. Subsequently she underwent diagnostic mammogram which showed a 3 cm area of linear calcifications in the central left breast. She underwent stereotactic biopsy on 05/04/20. The biopsy revealed flat epithelial atypia arising in a sclerotic lesion. The patient is now referred to [place] for evaluation for excision.  The patient denies any family history of breast cancer. She denies any previous history of breast problems except for a benign biopsy in her right breast many years ago.  On 06/21/20 she underwent left breast lumpectomy. This showed a complex sclerosing lesion with no sign of malignancy. She is healing well from this area.  Recently she had a skin biopsy of a suspicious lesion on her upper right chest over her clavicle. 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 She presents now for wide excision.   Problem List/Past Medical Rodman Key K. 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 Glinda Natzke, MD; 07/25/2020 3:21 PM) Breast Biopsy Bilateral. Cataract Surgery Bilateral. Foot Surgery Bilateral. Hemorrhoidectomy Oral Surgery  Diagnostic Studies History (Toshiyuki Fredell K. Demetrie Borge, MD; 07/25/2020 3:21 PM) Colonoscopy 5-10 years ago Mammogram within last year Pap Smear 1-5 years ago  Allergies Rodman Key K. Rachele Lamaster, MD; 07/25/2020 3:21 PM) Paper Tape 1"x10yd *MEDICAL DEVICES AND SUPPLIES* Codeine Sulfate *ANALGESICS - OPIOID* Hives. Iodinated Diagnostic Agents Anaphylaxis.  Medication History Marcia Harvey.  Tu Bayle, MD; 07/25/2020 3:21 PM) Lisinopril-hydroCHLOROthiazide (20-25MG  Tablet, Oral) Active. Pravastatin Sodium (40MG  Tablet, Oral) Active. Omeprazole (40MG  Capsule DR, Oral) Active. Sertraline HCl (25MG  Tablet, Oral) Active. traZODone HCl (50MG  Tablet, Oral) Active.  Social History Marcia Harvey. Mete Purdum, MD; 07/25/2020 3:21 PM) Caffeine use Carbonated beverages, Coffee, Tea. No drug use Tobacco use Former smoker.  Family History Marcia Harvey. Jessiah Wojnar, MD; 07/25/2020 3:21 PM) Arthritis Father. Colon Cancer Mother. Colon Polyps Mother. Heart Disease Brother, Father. Hypertension Mother. Prostate Cancer Father. Thyroid problems Mother.  Pregnancy / Birth History Marcia Harvey. Gwendolin Briel, MD; 07/25/2020 3:21 PM) Age at menarche 30 years. Age of menopause 29-55 Gravida 0 Para 0  Other Problems Marcia Harvey. Torina Ey, MD; 07/25/2020 3:21 PM) Anxiety Disorder Arthritis Back Pain Depression Gastroesophageal Reflux Disease Hemorrhoids High blood pressure Hypercholesterolemia Migraine Headache Other disease, cancer, significant illness     Physical Exam Rodman Key K. Lenee Franze MD; 07/25/2020 3:21 PM)  The physical exam findings are as follows: Note:Constitutional: WDWN in NAD, conversant, no obvious deformities; resting comfortably Eyes: Pupils equal, round; sclera anicteric; moist conjunctiva; no lid lag HENT: Oral mucosa moist; good dentition Neck: No masses palpated, trachea midline; no thyromegaly Lungs: CTA bilaterally; normal respiratory effort Her left circumareolar incision is healing well with no sign of infection. Firm scar tissue under the incision. The right upper chest biopsy site seems mildly inflamed but no clear sign of infection. No bleeding or drainage. CV: Regular rate and rhythm; no murmurs; extremities well-perfused with no edema Abd: +bowel sounds, soft, non-tender, no palpable organomegaly; no palpable hernias Musc: Normal gait; no apparent clubbing  or cyanosis in extremities Lymphatic: No palpable cervical or axillary lymphadenopathy Skin: Warm, dry; no sign of jaundice Psychiatric - alert and oriented x 4; calm  mood and affect    Assessment & Plan Marcia Harvey. 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 The surgical procedure has been discussed with the patient. Potential risks, benefits, alternative treatments, and expected outcomes have been explained. All of the patient's questions at this time have been answered. The likelihood of reaching the patient's treatment goal is good. The patient understand the proposed surgical procedure and wishes to proceed.  Marcia Harvey. Georgette Dover, MD, [facility]  General/ Trauma Surgery   07/25/2020 3:22 PM

## 2020-07-27 ENCOUNTER — Encounter (HOSPITAL_BASED_OUTPATIENT_CLINIC_OR_DEPARTMENT_OTHER): Payer: Self-pay | Admitting: Surgery

## 2020-07-27 ENCOUNTER — Other Ambulatory Visit: Payer: Self-pay

## 2020-07-29 ENCOUNTER — Other Ambulatory Visit: Payer: Self-pay

## 2020-07-29 ENCOUNTER — Encounter (HOSPITAL_BASED_OUTPATIENT_CLINIC_OR_DEPARTMENT_OTHER)
Admission: RE | Admit: 2020-07-29 | Discharge: 2020-07-29 | Disposition: A | Discharge status: Home / Self Care | Payer: Medicare PPO | Source: Ambulatory Visit | Attending: Surgery | Admitting: Surgery

## 2020-07-29 ENCOUNTER — Other Ambulatory Visit (HOSPITAL_COMMUNITY)
Admission: RE | Admit: 2020-07-29 | Discharge: 2020-07-29 | Disposition: A | Discharge status: Home / Self Care | Payer: Medicare PPO | Source: Ambulatory Visit | Attending: Surgery | Admitting: Surgery

## 2020-07-29 DIAGNOSIS — Z20822 Contact with and (suspected) exposure to covid-19: Secondary | ICD-10-CM | POA: Diagnosis not present

## 2020-07-29 DIAGNOSIS — Z01812 Encounter for preprocedural laboratory examination: Secondary | ICD-10-CM | POA: Diagnosis present

## 2020-07-29 LAB — BASIC METABOLIC PANEL
Anion gap: 8 (ref 5–15)
BUN: 17 mg/dL (ref 8–23)
CO2: 27 mmol/L (ref 22–32)
Calcium: 9.1 mg/dL (ref 8.9–10.3)
Chloride: 104 mmol/L (ref 98–111)
Creatinine, Ser: 0.77 mg/dL (ref 0.44–1.00)
GFR, Estimated: >60 mL/min (ref >60)
Glucose, Bld: 93 mg/dL (ref 70–99)
Potassium: 3.5 mmol/L (ref 3.5–5.1)
Sodium: 139 mmol/L (ref 135–145)

## 2020-07-29 LAB — SARS CORONAVIRUS 2 (TAT 6-24 HRS): SARS Coronavirus 2: NEGATIVE

## 2020-07-29 MED ORDER — CHLORHEXIDINE GLUCONATE CLOTH 2 % EX PADS: 6.0000 | MEDICATED_PAD | Freq: Once | CUTANEOUS | Status: DC

## 2020-07-29 NOTE — Progress Notes (Signed)
Surgical soap given to patient, instructions given, patient verbalized understanding.

## 2020-08-02 ENCOUNTER — Other Ambulatory Visit: Payer: Self-pay

## 2020-08-02 ENCOUNTER — Ambulatory Visit (HOSPITAL_BASED_OUTPATIENT_CLINIC_OR_DEPARTMENT_OTHER)
Admission: RE | Admit: 2020-08-02 | Discharge: 2020-08-02 | Disposition: A | Discharge status: Home / Self Care | Payer: Medicare PPO | Attending: Surgery | Admitting: Surgery

## 2020-08-02 ENCOUNTER — Ambulatory Visit (HOSPITAL_BASED_OUTPATIENT_CLINIC_OR_DEPARTMENT_OTHER): Payer: Medicare PPO | Admitting: Anesthesiology

## 2020-08-02 ENCOUNTER — Encounter (HOSPITAL_BASED_OUTPATIENT_CLINIC_OR_DEPARTMENT_OTHER): Payer: Self-pay | Admitting: Surgery

## 2020-08-02 ENCOUNTER — Encounter (HOSPITAL_BASED_OUTPATIENT_CLINIC_OR_DEPARTMENT_OTHER)
Admission: RE | Disposition: A | Discharge status: Home / Self Care | Payer: Self-pay | Source: Home / Self Care | Attending: Surgery

## 2020-08-02 DIAGNOSIS — Z8249 Family history of ischemic heart disease and other diseases of the circulatory system: Secondary | ICD-10-CM | POA: Insufficient documentation

## 2020-08-02 DIAGNOSIS — Z8042 Family history of malignant neoplasm of prostate: Secondary | ICD-10-CM | POA: Insufficient documentation

## 2020-08-02 DIAGNOSIS — Z800 Family history of malignant neoplasm of digestive organs: Secondary | ICD-10-CM | POA: Insufficient documentation

## 2020-08-02 DIAGNOSIS — Z888 Allergy status to other drugs, medicaments and biological substances status: Secondary | ICD-10-CM | POA: Insufficient documentation

## 2020-08-02 DIAGNOSIS — C44529 Squamous cell carcinoma of skin of other part of trunk: Secondary | ICD-10-CM | POA: Diagnosis not present

## 2020-08-02 DIAGNOSIS — Z8261 Family history of arthritis: Secondary | ICD-10-CM | POA: Insufficient documentation

## 2020-08-02 DIAGNOSIS — Z79899 Other long term (current) drug therapy: Secondary | ICD-10-CM | POA: Diagnosis not present

## 2020-08-02 DIAGNOSIS — Z87891 Personal history of nicotine dependence: Secondary | ICD-10-CM | POA: Diagnosis not present

## 2020-08-02 DIAGNOSIS — Z8349 Family history of other endocrine, nutritional and metabolic diseases: Secondary | ICD-10-CM | POA: Insufficient documentation

## 2020-08-02 DIAGNOSIS — Z885 Allergy status to narcotic agent status: Secondary | ICD-10-CM | POA: Diagnosis not present

## 2020-08-02 HISTORY — PX: MELANOMA EXCISION: SHX5266

## 2020-08-02 SURGERY — EXCISION, MELANOMA
Anesthesia: General | Site: Shoulder | Laterality: Right

## 2020-08-02 MED ORDER — ONDANSETRON HCL 4 MG/2ML IJ SOLN
INTRAMUSCULAR | Status: DC | PRN
  Administered 2020-08-02: 4 mg via INTRAVENOUS

## 2020-08-02 MED ORDER — CEFAZOLIN SODIUM-DEXTROSE 2-4 GM/100ML-% IV SOLN
INTRAVENOUS | Status: AC
  Filled 2020-08-02: qty 100

## 2020-08-02 MED ORDER — ONDANSETRON HCL 4 MG/2ML IJ SOLN
INTRAMUSCULAR | Status: AC
  Filled 2020-08-02: qty 2

## 2020-08-02 MED ORDER — MIDAZOLAM HCL 5 MG/5ML IJ SOLN
INTRAMUSCULAR | Status: DC | PRN
  Administered 2020-08-02: 1 mg via INTRAVENOUS

## 2020-08-02 MED ORDER — BUPIVACAINE-EPINEPHRINE 0.25% -1:200000 IJ SOLN
INTRAMUSCULAR | Status: DC | PRN
  Administered 2020-08-02: 10 mL

## 2020-08-02 MED ORDER — LACTATED RINGERS IV SOLN: INTRAVENOUS | Status: DC

## 2020-08-02 MED ORDER — FENTANYL CITRATE (PF) 100 MCG/2ML IJ SOLN
INTRAMUSCULAR | Status: AC
  Filled 2020-08-02: qty 2

## 2020-08-02 MED ORDER — FENTANYL CITRATE (PF) 100 MCG/2ML IJ SOLN
INTRAMUSCULAR | Status: DC | PRN
  Administered 2020-08-02: 25 ug via INTRAVENOUS
  Administered 2020-08-02: 50 ug via INTRAVENOUS

## 2020-08-02 MED ORDER — CEFAZOLIN SODIUM-DEXTROSE 2-4 GM/100ML-% IV SOLN
2.0000 g | INTRAVENOUS | Status: AC
  Administered 2020-08-02: 2 g via INTRAVENOUS

## 2020-08-02 MED ORDER — OXYCODONE HCL 5 MG PO TABS: 5.0000 mg | ORAL_TABLET | Freq: Once | ORAL | Status: DC | PRN

## 2020-08-02 MED ORDER — PROPOFOL 10 MG/ML IV BOLUS
INTRAVENOUS | Status: DC | PRN
  Administered 2020-08-02: 200 mg via INTRAVENOUS

## 2020-08-02 MED ORDER — MIDAZOLAM HCL 2 MG/2ML IJ SOLN
INTRAMUSCULAR | Status: AC
  Filled 2020-08-02: qty 2

## 2020-08-02 MED ORDER — PROMETHAZINE HCL 25 MG/ML IJ SOLN: 6.2500 mg | INTRAMUSCULAR | Status: DC | PRN

## 2020-08-02 MED ORDER — PROPOFOL 10 MG/ML IV BOLUS
INTRAVENOUS | Status: AC
  Filled 2020-08-02: qty 20

## 2020-08-02 MED ORDER — AMISULPRIDE (ANTIEMETIC) 5 MG/2ML IV SOLN: 10.0000 mg | Freq: Once | INTRAVENOUS | Status: DC | PRN

## 2020-08-02 MED ORDER — LIDOCAINE HCL (CARDIAC) PF 100 MG/5ML IV SOSY
PREFILLED_SYRINGE | INTRAVENOUS | Status: DC | PRN
  Administered 2020-08-02: 60 mg via INTRATRACHEAL

## 2020-08-02 MED ORDER — EPHEDRINE SULFATE 50 MG/ML IJ SOLN
INTRAMUSCULAR | Status: DC | PRN
  Administered 2020-08-02: 15 mg via INTRAVENOUS
  Administered 2020-08-02: 10 mg via INTRAVENOUS

## 2020-08-02 MED ORDER — ACETAMINOPHEN 500 MG PO TABS
ORAL_TABLET | ORAL | Status: AC
  Filled 2020-08-02: qty 2

## 2020-08-02 MED ORDER — OXYCODONE HCL 5 MG/5ML PO SOLN: 5.0000 mg | Freq: Once | ORAL | Status: DC | PRN

## 2020-08-02 MED ORDER — DEXAMETHASONE SODIUM PHOSPHATE 10 MG/ML IJ SOLN
INTRAMUSCULAR | Status: DC | PRN
  Administered 2020-08-02: 5 mg via INTRAVENOUS

## 2020-08-02 MED ORDER — HYDROMORPHONE HCL 1 MG/ML IJ SOLN
0.2500 mg | INTRAMUSCULAR | Status: DC | PRN
Start: 2020-08-02 — End: 2020-08-02

## 2020-08-02 MED ORDER — ACETAMINOPHEN 500 MG PO TABS
1000.0000 mg | ORAL_TABLET | ORAL | Status: AC
  Administered 2020-08-02: 1000 mg via ORAL

## 2020-08-02 SURGICAL SUPPLY — 43 items
APL PRP STRL LF DISP 70% ISPRP (MISCELLANEOUS) ×1
APL SKNCLS STERI-STRIP NONHPOA (GAUZE/BANDAGES/DRESSINGS) ×1
BENZOIN TINCTURE PRP APPL 2/3 (GAUZE/BANDAGES/DRESSINGS) ×2 IMPLANT
BLADE CLIPPER SURG (BLADE) IMPLANT
BLADE SURG 15 STRL LF DISP TIS (BLADE) ×1 IMPLANT
BLADE SURG 15 STRL SS (BLADE) ×2
CANISTER SUCT 1200ML W/VALVE (MISCELLANEOUS) IMPLANT
CHLORAPREP W/TINT 26 (MISCELLANEOUS) ×2 IMPLANT
COVER BACK TABLE 60X90IN (DRAPES) ×2 IMPLANT
COVER MAYO STAND STRL (DRAPES) ×2 IMPLANT
COVER WAND RF STERILE (DRAPES) IMPLANT
DECANTER SPIKE VIAL GLASS SM (MISCELLANEOUS) IMPLANT
DRAPE LAPAROTOMY 100X72 PEDS (DRAPES) ×2 IMPLANT
DRAPE UTILITY XL STRL (DRAPES) ×2 IMPLANT
DRSG TEGADERM 2-3/8X2-3/4 SM (GAUZE/BANDAGES/DRESSINGS) IMPLANT
DRSG TEGADERM 4X4.75 (GAUZE/BANDAGES/DRESSINGS) ×2 IMPLANT
ELECT COATED BLADE 2.86 ST (ELECTRODE) ×2 IMPLANT
ELECT REM PT RETURN 9FT ADLT (ELECTROSURGICAL) ×2
ELECTRODE REM PT RTRN 9FT ADLT (ELECTROSURGICAL) ×1 IMPLANT
GAUZE SPONGE 4X4 12PLY STRL LF (GAUZE/BANDAGES/DRESSINGS) IMPLANT
GLOVE SURG ENC MOIS LTX SZ7 (GLOVE) ×2 IMPLANT
GLOVE SURG UNDER POLY LF SZ7 (GLOVE) ×2 IMPLANT
GLOVE SURG UNDER POLY LF SZ7.5 (GLOVE) ×2 IMPLANT
GOWN STRL REUS W/ TWL LRG LVL3 (GOWN DISPOSABLE) ×3 IMPLANT
GOWN STRL REUS W/TWL LRG LVL3 (GOWN DISPOSABLE) ×6
NEEDLE HYPO 25X1 1.5 SAFETY (NEEDLE) ×2 IMPLANT
NS IRRIG 1000ML POUR BTL (IV SOLUTION) IMPLANT
PACK BASIN DAY SURGERY FS (CUSTOM PROCEDURE TRAY) ×2 IMPLANT
PENCIL SMOKE EVACUATOR (MISCELLANEOUS) ×2 IMPLANT
SLEEVE SCD COMPRESS KNEE MED (STOCKING) ×2 IMPLANT
SPONGE GAUZE 2X2 8PLY STRL LF (GAUZE/BANDAGES/DRESSINGS) IMPLANT
STRIP CLOSURE SKIN 1/2X4 (GAUZE/BANDAGES/DRESSINGS) ×2 IMPLANT
SUT MON AB 4-0 PC3 18 (SUTURE) IMPLANT
SUT PROLENE 6 0 P 1 18 (SUTURE) IMPLANT
SUT SILK 2 0 PERMA HAND 18 BK (SUTURE) IMPLANT
SUT VIC AB 3-0 SH 27 (SUTURE) ×2
SUT VIC AB 3-0 SH 27X BRD (SUTURE) ×1 IMPLANT
SUT VICRYL 3-0 CR8 SH (SUTURE) ×2 IMPLANT
SYR BULB EAR ULCER 3OZ GRN STR (SYRINGE) IMPLANT
SYR CONTROL 10ML LL (SYRINGE) ×2 IMPLANT
TOWEL GREEN STERILE FF (TOWEL DISPOSABLE) ×2 IMPLANT
TUBE CONNECTING 20X1/4 (TUBING) IMPLANT
YANKAUER SUCT BULB TIP NO VENT (SUCTIONS) IMPLANT

## 2020-08-02 NOTE — Anesthesia Preprocedure Evaluation (Addendum)
Anesthesia Evaluation  Patient identified by MRN, date of birth, ID band Patient awake    Reviewed: Allergy & Precautions, H&P , NPO status , Patient's Chart, lab work & pertinent test results  Airway Mallampati: I  TM Distance: >3 FB Neck ROM: Full    Dental no notable dental hx.    Pulmonary neg pulmonary ROS, former smoker,    Pulmonary exam normal breath sounds clear to auscultation       Cardiovascular hypertension, Pt. on medications Normal cardiovascular exam Rhythm:Regular Rate:Normal  Normal sinus rhythm Normal ECG since last tracing no significant change Confirmed by Larae Grooms [phone]) on 06/17/2020 10:27:10 PM   Neuro/Psych PSYCHIATRIC DISORDERS Anxiety Depression Cervical spondylosis  Neuromuscular disease (radiculopathy)    GI/Hepatic Neg liver ROS, GERD  ,  Endo/Other  negative endocrine ROS  Renal/GU negative Renal ROS  negative genitourinary   Musculoskeletal  (+) Arthritis , Osteoarthritis,    Abdominal Normal abdominal exam  (+) + obese,   Peds negative pediatric ROS (+)  Hematology negative hematology ROS (+)   Anesthesia Other Findings Squamous cell carcinoma  Reproductive/Obstetrics negative OB ROS                             Anesthesia Physical  Anesthesia Plan  ASA: III  Anesthesia Plan: General   Post-op Pain Management:    Induction: Intravenous  PONV Risk Score and Plan: 3 and Ondansetron, Dexamethasone, Midazolam and Treatment may vary due to age or medical condition  Airway Management Planned: LMA  Additional Equipment: None  Intra-op Plan:   Post-operative Plan: Extubation in OR  Informed Consent: I have reviewed the patients History and Physical, chart, labs and discussed the procedure including the risks, benefits and alternatives for the proposed anesthesia with the patient or authorized representative who has indicated his/her  understanding and acceptance.     Dental advisory given  Plan Discussed with: CRNA, Anesthesiologist and Surgeon  Anesthesia Plan Comments:         Anesthesia Quick Evaluation

## 2020-08-02 NOTE — Discharge Instructions (Signed)
[place] Office Phone Number [phone]   POST OP INSTRUCTIONS  Always review your discharge instruction sheet given to you by the facility where your surgery was performed.  IF YOU HAVE DISABILITY OR FAMILY LEAVE FORMS, YOU MUST BRING THEM TO THE OFFICE FOR PROCESSING.  DO NOT GIVE THEM TO YOUR DOCTOR.  1. You may take acetaminophen (Tylenol) or ibuprofen (Advil) as needed. 2. Take your usually prescribed medications unless otherwise directed 3. You should eat very light the first 24 hours after surgery, such as soup, crackers, pudding, etc.  Resume your normal diet the day after surgery. 4. Most patients will experience some swelling and bruising around the surgical site.  Ice packs will help.  Swelling and bruising can take several days to resolve.  5. It is common to experience some constipation if taking pain medication after surgery.  Increasing fluid intake and taking a stool softener will usually help or prevent this problem from occurring.  A mild laxative (Milk of Magnesia or Miralax) should be taken according to package directions if there are no bowel movements after 48 hours. 6. You may remove your bandages 48 hours after surgery, and you may shower at that time.  You will have steri-strips (small skin tapes) in place directly over the incision.  These strips should be left on the skin for 7-10 days.   7. ACTIVITIES:  You may resume regular daily activities (gradually increasing) beginning the next day.   You may have sexual intercourse when it is comfortable. a. You may drive when you no longer are taking prescription pain medication, you can comfortably wear a seatbelt, and you can safely maneuver your car and apply brakes. 8. You should see your doctor in the office for a follow-up appointment approximately two to three weeks after your surgery.    WHEN TO CALL YOUR DOCTOR: 1. Fever over 101.0 2. Nausea and/or vomiting. 3. Extreme swelling or  bruising. 4. Continued bleeding from incision. 5. Increased pain, redness, or drainage from the incision.  The clinic staff is available to answer your questions during regular business hours.  Please don't hesitate to call and ask to speak to one of the nurses for clinical concerns.  If you have a medical emergency, go to the nearest emergency room or call 911.  A surgeon from [facility] is always on call at the hospital.  For further questions, please visit [email or web address]   May take Tylenol after 8:30pm, if needed.    Post Anesthesia Home Care Instructions  Activity: Get plenty of rest for the remainder of the day. A responsible individual must stay with you for 24 hours following the procedure.  For the next 24 hours, DO NOT: -Drive a car -Paediatric nurse -Drink alcoholic beverages -Take any medication unless instructed by your physician -Make any legal decisions or sign important papers.  Meals: Start with liquid foods such as gelatin or soup. Progress to regular foods as tolerated. Avoid greasy, spicy, heavy foods. If nausea and/or vomiting occur, drink only clear liquids until the nausea and/or vomiting subsides. Call your physician if vomiting continues.  Special Instructions/Symptoms: Your throat may feel dry or sore from the anesthesia or the breathing tube placed in your throat during surgery. If this causes discomfort, gargle with warm salt water. The discomfort should disappear within 24 hours.  If you had a scopolamine patch placed behind your ear for the management of post- operative nausea and/or vomiting:  1. The medication in the patch is  effective for 72 hours, after which it should be removed.  Wrap patch in a tissue and discard in the trash. Wash hands thoroughly with soap and water. 2. You may remove the patch earlier than 72 hours if you experience unpleasant side effects which may include dry mouth, dizziness or visual  disturbances. 3. Avoid touching the patch. Wash your hands with soap and water after contact with the patch.

## 2020-08-02 NOTE — Interval H&P Note (Signed)
History and Physical Interval Note:  08/02/2020 2:26 PM  Marcia Harvey  has presented today for surgery, with the diagnosis of SQUAMOUS CELL CARCINOMA RIGHT CHEST.  The various methods of treatment have been discussed with the patient and family. After consideration of risks, benefits and other options for treatment, the patient has consented to  Procedure(s) with comments: 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 (Right) - ROOM 8 STARTING AT 04:15PM FOR 45 MIN as a surgical intervention.  The patient's history has been reviewed, patient examined, no change in status, stable for surgery.  I have reviewed the patient's chart and labs.  Questions were answered to the patient's satisfaction.     Maia Petties

## 2020-08-02 NOTE — Op Note (Signed)
Wide Local Excision Procedure Note  Indications: The patient underwent biopsy of a right chest wall lesion over her clavicle which came back as squamous cell carcinoma with involved margins. She presents today for wide local excision.    Pre-operative Diagnosis: Squamous cell carcinoma   Post-operative Diagnosis: same  Surgeon: Donnie Mesa, MD   Surgery Resident: Jerilee Hoh, MD   Anesthesia: Maggie Schwalbe, pec block, local   Procedure Details  Following informed consent the patient was brought to the operating room and placed supine on the operating room table. SCDs were applied, antibiotics were administered. The area of prior skin biopsy on the right anterior chest just above the clavicle was identified. This area was prepped and draped in sterile fashion. A time out was held with all members of the operating room staff.   One centimeter circumferential margins around the lesion were measured and marked. An elliptical incision was made with the scalpel around the lesion, maintaining these 1 cm margins. This incision was carried down through the subcutaneous tissues and the lesion was excised off of the underlying fascia. The specimen was marked with a long stitch laterally and short stitch superiorly and sent for pathology. The wound was irrigated. Hemostasis was achieved. Subcutaneous skin flaps were raised to relieve tension from the incision. The incision was closed in layers with 3-0 vicryl interrupted dermal sutures and a running 4-0 Monocryl. Benzoin and steri strips were applied. A clean gauze and Tegaderm dressing was applied. The patient was then extubated and brought to the recovery room in stable condition.  All sponge, instrument, and needle counts were correct prior to closure and at the conclusion of the case.  Estimated Blood Loss: Minimal           Complications: None; patient tolerated the procedure well.         Disposition: PACU - hemodynamically stable.         Condition:  stable

## 2020-08-02 NOTE — Transfer of Care (Signed)
Immediate Anesthesia Transfer of Care Note  Patient: Marcia Harvey  Procedure(s) Performed: 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 (Right Shoulder)  Patient Location: PACU  Anesthesia Type:General  Level of Consciousness: drowsy, patient cooperative and responds to stimulation  Airway & Oxygen Therapy: Patient Spontanous Breathing and Patient connected to face mask oxygen  Post-op Assessment: Report given to RN and Post -op Vital signs reviewed and stable  Post vital signs: Reviewed and stable  Last Vitals:  Vitals Value Taken Time  BP    Temp    Pulse 88 08/02/20 1626  Resp 17 08/02/20 1626  SpO2 100 % 08/02/20 1626  Vitals shown include unvalidated device data.  Last Pain:  Vitals:   08/02/20 1425  TempSrc: Oral  PainSc: 5          Complications: No complications documented.

## 2020-08-02 NOTE — Anesthesia Postprocedure Evaluation (Signed)
Anesthesia Post Note  Patient: Marcia Harvey  Procedure(s) Performed: 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 (Right Shoulder)     Patient location during evaluation: PACU Anesthesia Type: General Level of consciousness: awake and alert Pain management: pain level controlled Vital Signs Assessment: post-procedure vital signs reviewed and stable Respiratory status: spontaneous breathing, nonlabored ventilation and respiratory function stable Cardiovascular status: blood pressure returned to baseline and stable Postop Assessment: no apparent nausea or vomiting Anesthetic complications: no   No complications documented.  Last Vitals:  Vitals:   08/02/20 1645 08/02/20 1714  BP: (!) 141/81 (!) 141/78  Pulse: 85 81  Resp: 14 16  Temp:  36.6 C  SpO2: 97% 96%    Last Pain:  Vitals:   08/02/20 1714  TempSrc:   PainSc: 0-No pain                 Lynda Rainwater

## 2020-08-02 NOTE — Anesthesia Procedure Notes (Signed)
Procedure Name: LMA Insertion Date/Time: 08/02/2020 1:58 PM Performed by: Glory Buff, CRNA Pre-anesthesia Checklist: Patient identified, Emergency Drugs available, Suction available and Patient being monitored Patient Re-evaluated:Patient Re-evaluated prior to induction Oxygen Delivery Method: Circle system utilized Preoxygenation: Pre-oxygenation with 100% oxygen Induction Type: IV induction LMA: LMA inserted LMA Size: 4.0 Number of attempts: 1 Placement Confirmation: positive ETCO2 Tube secured with: Tape Dental Injury: Teeth and Oropharynx as per pre-operative assessment

## 2020-08-03 ENCOUNTER — Encounter (HOSPITAL_BASED_OUTPATIENT_CLINIC_OR_DEPARTMENT_OTHER): Payer: Self-pay | Admitting: Surgery

## 2020-08-04 LAB — SURGICAL PATHOLOGY

## 2021-08-15 ENCOUNTER — Ambulatory Visit: Payer: Self-pay

## 2021-08-15 ENCOUNTER — Ambulatory Visit: Payer: Medicare PPO | Admitting: Orthopaedic Surgery

## 2021-08-15 ENCOUNTER — Encounter: Payer: Self-pay | Admitting: Orthopaedic Surgery

## 2021-08-15 VITALS — BP 135/82 | HR 71 | Ht 63.75 in | Wt 170.0 lb

## 2021-08-15 DIAGNOSIS — M25562 Pain in left knee: Secondary | ICD-10-CM | POA: Diagnosis not present

## 2021-08-15 NOTE — Progress Notes (Signed)
? ?Office Visit Note ?  ?Patient: Marcia Harvey           ?Date of Birth: Jun 25, 1945           ?MRN: [id number] ?Visit Date: 08/15/2021 ?             ?Requested by: Bernerd Limbo, MD ?[address] ?[address] ?[place],  [place] [id number] ?PCP: Bernerd Limbo, MD ? ? ?Assessment & Plan: ?Visit Diagnoses:  ?1. Acute pain of left knee   ? ? ?Plan: Knee injection performed she does have Baker's cyst posterior medial joint line tenderness and may have a posterior meniscal tear.  If injections not effective and she has persistent problems she will let us know. ? ?Follow-Up Instructions: No follow-ups on file.  ? ?Orders:  ?Orders Placed This Encounter  ?Procedures  ? XR KNEE 3 VIEW LEFT  ? ?No orders of the defined types were placed in this encounter. ? ? ? ? Procedures: ?Large Joint Inj: L knee on 08/15/2021 9:56 AM ?Indications: joint swelling and pain ?Details: 22 G 1.5 in needle, anterolateral approach ? ?Arthrogram: No ? ?Medications: 0.5 mL lidocaine 1 %; 3 mL bupivacaine 0.5 %; 40 mg methylPREDNISolone acetate 40 MG/ML ?Outcome: tolerated well, no immediate complications ?Procedure, treatment alternatives, risks and benefits explained, specific risks discussed. Consent was given by the patient. Immediately prior to procedure a time out was called to verify the correct patient, procedure, equipment, support staff and site/side marked as required. Patient was prepped and draped in the usual sterile fashion.  ? ? ? ? ?Clinical Data: ?No additional findings. ? ? ?Subjective: ?Chief Complaint  ?Patient presents with  ? Left Knee - Pain  ? ? ?HPI 76 year old female tripped over a pet playpen about a month ago.  She had some ecchymosis on her right medial leg but states her right knee got better and the ecchymosis resolved.  She has had persistent pain in her left knee particularly with flexion she notices it in the popliteal region feeling very tight and painful and also pain with certain activities.  She is gotten  better as far as walking she denies any true locking.  She is able to ambulate without limping but continues to have sharp pain in the popliteal region particularly with knee flexion.  She has used some ibuprofen with some relief. ? ?Review of Systems all of the systems are noncontributory she has had problems with back pain in the past. ? ? ?Objective: ?Vital Signs: BP 135/82   Pulse 71   Ht 5' 3.75" (1.619 m)   Wt 170 lb (77.1 kg)   BMI 29.41 kg/m?  ? ?Physical Exam ?Constitutional:   ?   Appearance: She is well-developed.  ?HENT:  ?   Head: Normocephalic.  ?   Right Ear: External ear normal.  ?   Left Ear: External ear normal. There is no impacted cerumen.  ?Eyes:  ?   Pupils: Pupils are equal, round, and reactive to light.  ?Neck:  ?   Thyroid: No thyromegaly.  ?   Trachea: No tracheal deviation.  ?Cardiovascular:  ?   Rate and Rhythm: Normal rate.  ?Pulmonary:  ?   Effort: Pulmonary effort is normal.  ?Abdominal:  ?   Palpations: Abdomen is soft.  ?Musculoskeletal:  ?   Cervical back: No rigidity.  ?Skin: ?   General: Skin is warm and dry.  ?Neurological:  ?   Mental Status: She is alert and oriented to person, place, and  time.  ?Psychiatric:     ?   Behavior: Behavior normal.  ? ? ?Ortho Exam patient has medial and lateral joint line tenderness posterior medial is pronounced.  Collateral ligaments are stable ACL PCL exam is normal negative logroll the hips normal heel toe gait with ambulation without left knee limp.  She has tenderness to palpation of the popliteal region and palpable small Baker's cyst.  Calf is soft.  Negative Homan ankle range of motion is full. ? ?Specialty Comments:  ?No specialty comments available. ? ?Imaging: ?No results found. ? ? ?PMFS History: ?Patient Active Problem List  ? Diagnosis Date Noted  ? Spondylosis without myelopathy or radiculopathy, cervical region 07/27/2019  ? Other intervertebral disc degeneration, lumbar region 07/27/2019  ? Fall from horse 01/27/2012  ? ?Past  Medical History:  ?Diagnosis Date  ? Anxiety   ? Anxiety   ? Arthritis   ? 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 Depression   ? Fall from horse 01/27/12  ? CT chest/Abd/Pelvis done at [facility]. Findings: Nondisplaced fx of posterior right 11th rib  ? GERD (gastroesophageal reflux disease)   ? Hypertension   ? Seizures ([place])   ? been through testing, no issues with seizures or epilepsy  ? Syncopal episodes   ? seizure with episode  ? White matter disease   ?  ?Family History  ?Problem Relation Age of Onset  ? Colon cancer Mother   ? Diabetes Maternal Grandfather   ? Diabetes Paternal Grandmother   ?  ?Past Surgical History:  ?Procedure Laterality Date  ? BREAST LUMPECTOMY WITH RADIOACTIVE SEED LOCALIZATION Left 06/21/2020  ? Procedure: LEFT BREAST LUMPECTOMY WITH RADIOACTIVE SEED LOCALIZATION;  Surgeon: Donnie Mesa, MD;  Location: [facility];  Service: General;  Laterality: Left;  ? BUNIONECTOMY Bilateral 1978  ? COLONOSCOPY    ? EYE SURGERY    ? MELANOMA EXCISION Right 08/02/2020  ? 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 ?Social History  ? ?Occupational History  ? Not on file  ?Tobacco Use  ? Smoking status: Former  ?  Types: Cigarettes  ?  Quit date: 05/21/2003  ?  Years since quitting: 18.2  ? Smokeless tobacco: Never  ?Substance and Sexual Activity  ? Alcohol use: Yes  ?  Comment: 3-4x per year  ? Drug use: No  ? Sexual activity: Not Currently  ?  Birth control/protection: Post-menopausal  ? ? ? ? ? ? ?

## 2021-08-27 MED ORDER — METHYLPREDNISOLONE ACETATE 40 MG/ML IJ SUSP
40.0000 mg | INTRAMUSCULAR | Status: AC | PRN
  Administered 2021-08-15: 40 mg via INTRA_ARTICULAR

## 2021-08-27 MED ORDER — BUPIVACAINE HCL 0.5 % IJ SOLN
3.0000 mL | INTRAMUSCULAR | Status: AC | PRN
  Administered 2021-08-15: 3 mL via INTRA_ARTICULAR

## 2021-08-27 MED ORDER — LIDOCAINE HCL 1 % IJ SOLN
0.5000 mL | INTRAMUSCULAR | Status: AC | PRN
  Administered 2021-08-15: .5 mL

## 2021-08-29 ENCOUNTER — Emergency Department (HOSPITAL_BASED_OUTPATIENT_CLINIC_OR_DEPARTMENT_OTHER)
Admission: EM | Admit: 2021-08-29 | Discharge: 2021-08-29 | Disposition: A | Discharge status: Home / Self Care | Payer: Medicare PPO | Attending: Emergency Medicine | Admitting: Emergency Medicine

## 2021-08-29 ENCOUNTER — Other Ambulatory Visit: Payer: Self-pay

## 2021-08-29 ENCOUNTER — Other Ambulatory Visit (HOSPITAL_BASED_OUTPATIENT_CLINIC_OR_DEPARTMENT_OTHER): Payer: Self-pay

## 2021-08-29 ENCOUNTER — Encounter (HOSPITAL_BASED_OUTPATIENT_CLINIC_OR_DEPARTMENT_OTHER): Payer: Self-pay

## 2021-08-29 DIAGNOSIS — M549 Dorsalgia, unspecified: Secondary | ICD-10-CM | POA: Diagnosis present

## 2021-08-29 DIAGNOSIS — Z79899 Other long term (current) drug therapy: Secondary | ICD-10-CM | POA: Insufficient documentation

## 2021-08-29 DIAGNOSIS — I10 Essential (primary) hypertension: Secondary | ICD-10-CM | POA: Diagnosis not present

## 2021-08-29 DIAGNOSIS — Z7982 Long term (current) use of aspirin: Secondary | ICD-10-CM | POA: Insufficient documentation

## 2021-08-29 DIAGNOSIS — M5441 Lumbago with sciatica, right side: Secondary | ICD-10-CM | POA: Diagnosis not present

## 2021-08-29 MED ORDER — OXYCODONE HCL 5 MG PO TABS
5.0000 mg | ORAL_TABLET | Freq: Once | ORAL | Status: AC
  Administered 2021-08-29: 5 mg via ORAL
  Filled 2021-08-29: qty 1

## 2021-08-29 MED ORDER — METHYLPREDNISOLONE 4 MG PO TBPK
ORAL_TABLET | ORAL | 0 refills | Status: DC
  Filled 2021-08-29: qty 21, 6d supply, fill #0

## 2021-08-29 MED ORDER — OXYCODONE HCL 5 MG PO TABS
5.0000 mg | ORAL_TABLET | Freq: Three times a day (TID) | ORAL | 0 refills | Status: DC | PRN
  Filled 2021-08-29: qty 10, 4d supply, fill #0

## 2021-08-29 MED ORDER — CYCLOBENZAPRINE HCL 5 MG PO TABS
10.0000 mg | ORAL_TABLET | Freq: Two times a day (BID) | ORAL | 0 refills | Status: DC | PRN
  Filled 2021-08-29: qty 20, 5d supply, fill #0

## 2021-08-29 NOTE — ED Triage Notes (Signed)
Pt arrives with reports of pain to back and right leg starting yesterday after moving rock for drainage in her yard. States that it feels like a pinched nerve because she was having tingling in the back of her thigh.  ?

## 2021-08-29 NOTE — ED Notes (Signed)
Pt ambulatory to waiting room. Pt verbalized understanding of discharge instructions.   

## 2021-08-29 NOTE — Discharge Instructions (Signed)
Recommend 1000 mg of Tylenol every 6 hours as needed for pain.  Recommend Voltaren gel.  Be careful while taking Roxicodone and Flexeril as these medications are sedating.  Do not mix with alcohol or drugs.  Do not do any dangerous activities including driving while taking these medications.  Follow-up with your primary care doctor symptoms are not improving.  Take Medrol Dosepak as prescribed. ?

## 2021-08-29 NOTE — ED Provider Notes (Signed)
?[facility] EM[facility] DEPART[facility] ?Provider Note ? ? ?CSN: [id number] ?Arrival date & time: 08/29/21  0720 ? ?  ? ?History ? ?Chief Complaint  ?Patient presents with  ? Back Pain  ? ? ?Marcia Harvey is a 76 y.o. female. ? ?The history is provided by the patient.  ?Back Pain ?Quality:  Aching ?Radiates to:  R thigh ?Pain severity:  Mild ?Onset quality:  Gradual ?Duration:  1 day ?Timing:  Intermittent ?Progression:  Waxing and waning ?Chronicity:  New ?Context: physical stress   ?Relieved by:  Nothing ?Worsened by:  Nothing ?Associated symptoms: no abdominal pain, no abdominal swelling, no bladder incontinence, no bowel incontinence, no chest pain, no dysuria, no fever, no headaches, no leg pain, no numbness, no paresthesias, no pelvic pain, no perianal numbness, no tingling, no weakness and no weight loss   ?Risk factors: no vascular disease   ? ?  ? ?Home Medications ?Prior to Admission medications   ?Medication Sig Start Date End Date Taking? Authorizing Provider  ?cyclobenzaprine (FLEXERIL) 5 MG tablet Take 2 tablets (10 mg total) by mouth 2 (two) times daily as needed for up to 20 doses for muscle spasms. 08/29/21  Yes Shanique Aslinger, DO  ?methylPREDNISolone (MEDROL DOSEPAK) 4 MG TBPK tablet Follow package insert 08/29/21  Yes Amenda Duclos, DO  ?oxyCODONE (ROXICODONE) 5 MG immediate release tablet Take 1 tablet (5 mg total) by mouth every 8 (eight) hours as needed for up to 10 doses for breakthrough pain. 08/29/21  Yes Marvell Stavola, DO  ?ascorbic acid (VITAMIN C) 500 MG tablet Take by mouth.    [provider]  ?aspirin EC 81 MG tablet Take 81 mg by mouth daily.    [provider]  ?b complex vitamins tablet Take 1 tablet by mouth daily.    [provider]  ?Cholecalciferol (VITAMIN D3) 2000 UNITS TABS Take 2,000 Units by mouth daily.    [provider]  ?lisinopril-hydrochlorothiazide (PRINZIDE,ZESTORETIC) 10-12.5 MG per tablet Take 1 tablet by mouth daily. NEED  VISIT, LABS!!  2nd NOTICE! 10/31/12   Theda Sers, PA-C  ?omeprazole (PRILOSEC) 40 MG capsule TAKE 1 CAPSULE BY MOUTH EVERY DAY 10/12/16   [provider]  ?pravastatin (PRAVACHOL) 40 MG tablet Take 40 mg by mouth daily.    [provider]  ?sertraline (ZOLOFT) 25 MG tablet TAKE 1 TABLET(25 MG) BY MOUTH DAILY 06/22/16   [provider]  ?traZODone (DESYREL) 50 MG tablet 25 to 50 mg at bedtime to help with sleep 03/12/19   [provider]  ?   ? ?Allergies    ?Codeine, Gadolinium derivatives, Iohexol, Shellfish-derived products, Iodine, Wound dressing adhesive, and Tape   ? ?Review of Systems   ?Review of Systems  ?Constitutional:  Negative for fever and weight loss.  ?Cardiovascular:  Negative for chest pain.  ?Gastrointestinal:  Negative for abdominal pain and bowel incontinence.  ?Genitourinary:  Negative for bladder incontinence, dysuria and pelvic pain.  ?Musculoskeletal:  Positive for back pain.  ?Neurological:  Negative for tingling, weakness, numbness, headaches and paresthesias.  ? ?Physical Exam ?Updated Vital Signs ?BP (!) 145/69 (BP Location: Right Arm)   Pulse 75   Temp 97.7 ?F (36.5 ?C) (Oral)   Resp (!) 24   Ht '5\' 3"'$  (1.6 m)   Wt 77.3 kg   SpO2 100%   BMI 30.20 kg/m?  ?Physical Exam ?Vitals and nursing note reviewed.  ?Constitutional:   ?   General: She is not in acute distress. ?  Appearance: She is well-developed.  ?HENT:  ?   Head: Normocephalic and atraumatic.  ?   Nose: Nose normal.  ?   Mouth/Throat:  ?   Mouth: Mucous membranes are moist.  ?Eyes:  ?   Extraocular Movements: Extraocular movements intact.  ?   Conjunctiva/sclera: Conjunctivae normal.  ?   Pupils: Pupils are equal, round, and reactive to light.  ?Cardiovascular:  ?   Rate and Rhythm: Normal rate and regular rhythm.  ?   Pulses: Normal pulses.  ?   Heart sounds: Normal heart sounds. No murmur heard. ?Pulmonary:  ?   Effort: Pulmonary effort is normal. No respiratory distress.  ?   Breath  sounds: Normal breath sounds.  ?Abdominal:  ?   Palpations: Abdomen is soft.  ?   Tenderness: There is no abdominal tenderness.  ?Musculoskeletal:     ?   General: Tenderness present. No swelling.  ?   Cervical back: Neck supple.  ?   Comments: Tenderness to the paraspinal lumbar muscles on the right and right gluteal muscles  ?Skin: ?   General: Skin is warm and dry.  ?   Capillary Refill: Capillary refill takes less than 2 seconds.  ?Neurological:  ?   General: No focal deficit present.  ?   Mental Status: She is alert and oriented to person, place, and time.  ?   Cranial Nerves: No cranial nerve deficit.  ?   Sensory: No sensory deficit.  ?   Motor: No weakness.  ?   Coordination: Coordination normal.  ?   Comments: 5+ out of 5 strength throughout, normal sensation,  ?Psychiatric:     ?   Mood and Affect: Mood normal.  ? ? ?ED Results / Procedures / Treatments   ?Labs ?(all labs ordered are listed, but only abnormal results are displayed) ?Labs Reviewed - No data to display ? ?EKG ?None ? ?Radiology ?No results found. ? ?Procedures ?Procedures  ? ? ?Medications Ordered in ED ?Medications  ?oxyCODONE (Oxy IR/ROXICODONE) immediate release tablet 5 mg (5 mg Oral Given 08/29/21 0808)  ? ? ?ED Course/ Medical Decision Making/ A&P ?  ?                        ?Medical Decision Making ?Risk ?Prescription drug management. ? ? ?JAIDEE Harvey is a 76 year old female with history of hypertension who presents to the ED with right lower back pain.  Was doing yard work yesterday morning and started to have some tightness in her low back shooting into her right leg at times.  She is tender in the paraspinal muscles on the right.  Tender in the right gluteal muscles.  She is not having any loss of bowel or bladder.  She has been taking some over-the-counter medications with some improvement.  Woke up this morning feeling very tight.  Differential diagnosis is likely muscle spasm/sciatica.  There is no specific traumatic process  and no concern for fracture.  No concern for cauda equina or other spinal cord issue.  She is got normal strength and sensation on exam.  Good pulses in her lower extremities.  No concern for peripheral arterial occlusion.  We will treat with Roxicodone, Medrol Dosepak, Flexeril, Tylenol, Voltaren gel.  Recommend follow-up with primary care doctor.  Discharged in good condition.  Given a dose of oxycodone in the ED with improvement. ? ?This chart was dictated using voice recognition software.  Despite best efforts to proofread,  errors  can occur which can change the documentation meaning.  ? ? ? ? ? ? ? ?Final Clinical Impression(s) / ED Diagnoses ?Final diagnoses:  ?Acute right-sided low back pain with right-sided sciatica  ? ? ?Rx / DC Orders ?ED Discharge Orders   ? ?      Ordered  ?  methylPREDNISolone (MEDROL DOSEPAK) 4 MG TBPK tablet       ? 08/29/21 0815  ?  cyclobenzaprine (FLEXERIL) 5 MG tablet  2 times daily PRN       ? 08/29/21 0815  ?  oxyCODONE (ROXICODONE) 5 MG immediate release tablet  Every 8 hours PRN       ? 08/29/21 0815  ? ?  ?  ? ?  ? ? ?  ?Lennice Sites, DO ?08/29/21 0818 ? ?

## 2021-09-19 ENCOUNTER — Encounter (HOSPITAL_COMMUNITY): Payer: Self-pay

## 2023-05-03 ENCOUNTER — Encounter: Payer: Self-pay | Admitting: Neurology

## 2023-06-04 ENCOUNTER — Other Ambulatory Visit: Payer: Self-pay

## 2023-06-04 ENCOUNTER — Ambulatory Visit: Payer: Medicare PPO | Admitting: Neurology

## 2023-06-04 ENCOUNTER — Encounter: Payer: Self-pay | Admitting: Neurology

## 2023-06-04 VITALS — BP 168/82 | HR 85 | Ht 63.0 in | Wt 172.2 lb

## 2023-06-04 DIAGNOSIS — R29898 Other symptoms and signs involving the musculoskeletal system: Secondary | ICD-10-CM

## 2023-06-04 DIAGNOSIS — M47812 Spondylosis without myelopathy or radiculopathy, cervical region: Secondary | ICD-10-CM

## 2023-06-04 NOTE — Patient Instructions (Signed)
Good to meet you!  Schedule MRA head without contrast, MRA neck with and without contrast  2. Schedule 1-hour EEG. If normal, we will plan for a 24-hour EEG  3. Depending on results, we may plan for a nerve and muscle test of the left leg  4. Follow-up after tests, call for any changes

## 2023-06-04 NOTE — Progress Notes (Signed)
NEUROLOGY CONSULTATION NOTE  Marcia Harvey MRN: [id number] DOB: [birth year]  Referring provider: Dr. Tressie Stalker Primary care provider: Dr. Tracey Harries  Reason for consult:  seizure/TIA  Dear Dr Lovell Sheehan:  Thank you for your kind referral of Marcia Harvey for consultation of the above symptoms. Although her history is well known to you, please allow me to reiterate it for the purpose of our medical record. She is alone in the office today. Records and images were personally reviewed where available.   HISTORY OF PRESENT ILLNESS: This is a pleasant 78 year old right-handed woman with a history of hypertension, hyperlipidemia, SCC, presenting for evaluation of recurrent episodes of transient left leg weakness. The first episode occurred in late August/early September, she noticed the sole of her left shoe was not catching on anything and she fell. The leg felt numb, like it wanted to "just float up in the air." She listed to the left and fell down. The symptoms lasted a 1-2 minutes then resolved. She had 2 more episodes with fall, in late September/early October and most recently on 03/29/23. She felt her left leg go limp, almost like the floor is slippery. There was no loss of consciousness, she was able to talk and understand her friend when it occurred the second time, it lasted less than a minute. The episode on 11/15 occurred at home, she made it to the bathroom but felt like she had no control over the left leg motion, "like it was hanging there." Face and arm were unaffected, no loss of consciousness. She denies any staring/unresponsive episodes, gaps in time, olfactory/gustatory hallucinations, myoclonic jerks. No headaches, dizziness, diplopia, dysarthria/dysyphagia, bowel/bladder dysfunction. No alcohol or change in sleep pattern (she does not sleep well, 3 hours at night). She has a history of vasovagal syncope since childhood, passing out when she thinks of anything medical. She  would have a funny feeling in her mouth, feel lightheaded. She had then 2 times in a month's time in 2016 while sitting at dinner with friends. It started with similar vasovagal symptoms but her friend said there was "some type of seizure." And she was neurologist Dr. Hyacinth Meeker with a normal brain MRI in 2016. The last syncopal episode was last summer in the heat. She has neck and back pain. She reports L4-5 is a mess, she has not seen Ortho. Pain responds to a round of prednisone. No back pain during the episodes. She used to take aspirin but stopped it several years ago due to easy bruising.  She had a normal birth and early development.  There is no history of febrile convulsions, CNS infections such as meningitis/encephalitis, significant traumatic brain injury, neurosurgical procedures, or family history of seizures.  She had a brain MRI without contrast done 03/2023 with no acute changes, there was mild to moderate chronic microvascular disease.   PAST MEDICAL HISTORY: Past Medical History:  Diagnosis Date   Anxiety    Anxiety    Arthritis    Cancer (HCC) 07/2020   SCC chest   Depression    Fall from horse 01/27/12   CT [facility]/Abd/Pelvis [facility] at [facility]. Findings: Nondisplaced fx of posterior right 11th rib   GERD (gastroesophageal reflux disease)    Hypertension    Seizures (HCC)    been through testing, no issues with seizures or epilepsy   Syncopal episodes    seizure with episode   White matter disease     PAST SURGICAL HISTORY: Past Surgical History:  Procedure Laterality Date   BREAST LUMPECTOMY WITH RADIOACTIVE SEED LOCALIZATION Left 06/21/2020   Procedure: LEFT BREAST LUMPECTOMY WITH RADIOACTIVE SEED LOCALIZATION;  Surgeon: Manus Rudd, MD;  Location: [facility];  Service: General;  Laterality: Left;   BUNIONECTOMY Bilateral 1978   COLONOSCOPY     EYE SURGERY     MELANOMA EXCISION Right 08/02/2020   Procedure: WIDE EXCISION OF SQUAMOUS CELL CARCINOMA  RIGHT CHEST;  Surgeon: Manus Rudd, MD;  Location: [facility];  Service: General;  Laterality: Right;  ROOM 8 STARTING AT 04:15PM FOR 45 MIN    MEDICATIONS: Current Outpatient Medications on File Prior to Visit  Medication Sig Dispense Refill   ascorbic acid (VITAMIN C) 500 MG tablet Take by mouth.     atorvastatin (LIPITOR) 40 MG tablet Take 40 mg by mouth daily.     augmented betamethasone dipropionate (DIPROLENE-AF) 0.05 % cream Apply topically 2 (two) times daily.     b complex vitamins tablet Take 1 tablet by mouth daily.     carboxymethylcellul-glycerin (OPTIVE) 0.5-0.9 % ophthalmic solution Apply to eye.     Cholecalciferol (VITAMIN D3) 2000 UNITS TABS Take 2,000 Units by mouth daily.     lisinopril-hydrochlorothiazide (PRINZIDE,ZESTORETIC) 10-12.5 MG per tablet Take 1 tablet by mouth daily. NEED VISIT, LABS!!  2nd NOTICE! 30 tablet 0   omeprazole (PRILOSEC) 40 MG capsule TAKE 1 CAPSULE BY MOUTH EVERY DAY     sertraline (ZOLOFT) 25 MG tablet TAKE 1 TABLET(25 MG) BY MOUTH DAILY     traZODone (DESYREL) 50 MG tablet 25 to 50 mg at bedtime to help with sleep     cyclobenzaprine (FLEXERIL) 5 MG tablet Take 2 tablets (10 mg total) by mouth 2 (two) times daily as needed for up to 20 doses for muscle spasms. (Patient not taking: Reported on 06/04/2023) 20 tablet 0   No current facility-administered medications on file prior to visit.    ALLERGIES: Allergies  Allergen Reactions   Codeine Hives   Gadolinium Derivatives Anaphylaxis   Iohexol Hives    Treated for anaphylactic rxn. during  CT   Shellfish-Derived Products Shortness Of Breath   Bee Pollen    Iodine    Wound Dressing Adhesive    Tape Rash    Irritating red rash    FAMILY HISTORY: Family History  Problem Relation Age of Onset   Colon cancer Mother    Diabetes Maternal Grandfather    Diabetes Paternal Grandmother     SOCIAL HISTORY: Social History   Socioeconomic History   Marital status: Single     Spouse name: Not on file   Number of children: Not on file   Years of education: Not on file   Highest education level: Not on file  Occupational History   Not on file  Tobacco Use   Smoking status: Former    Current packs/day: 0.00    Types: Cigarettes    Quit date: 05/21/2003    Years since quitting: 20.0   Smokeless tobacco: Never  Vaping Use   Vaping status: Never Used  Substance and Sexual Activity   Alcohol use: Yes    Comment: 3-4x per year   Drug use: No   Sexual activity: Not Currently    Birth control/protection: Post-menopausal  Other Topics Concern   Not on file  Social History Narrative   Are you right handed or left handed? right   Are you currently employed ? no   What is your current occupation? Retired  Do you live at home alone? Alone    Who lives with you? NA    What type of home do you live in: 1 story or 2 story?  1 story        Social Drivers of Corporate investment banker Strain: Low Risk  (01/22/2023)   Received from [facility]   Overall [employer] Strain (CARDIA)    Difficulty of Paying Living Expenses: Not very hard  Food Insecurity: No Food Insecurity (01/22/2023)   Received from [facility]   Hunger Vital Sign    Worried About Running Out of Food in the Last Year: Never true    Ran Out of Food in the Last Year: Never true  Transportation Needs: No Transportation Needs (01/22/2023)   Received from [facility] - Transportation    Lack of Transportation (Medical): No    Lack of Transportation (Non-Medical): No  Physical Activity: Sufficiently Active (01/22/2023)   Received from [facility]   Exercise Vital Sign    Days of Exercise per Week: 5 days    Minutes of Exercise per Session: 60 min  Stress: No Stress Concern Present (01/22/2023)   Received from [facility] of Occupational Health - Occupational Stress Questionnaire    Feeling of Stress : Only a little  Social Connections: Socially  Integrated (01/22/2023)   Received from [facility]   Social Network    How would you rate your social network (family, work, friends)?: Good participation with social networks  Intimate Partner Violence: Not At Risk (01/22/2023)   Received from [facility]   HITS    Over the last 12 months how often did your partner physically hurt you?: Never    Over the last 12 months how often did your partner insult you or talk down to you?: Never    Over the last 12 months how often did your partner threaten you with physical harm?: Never    Over the last 12 months how often did your partner scream or curse at you?: Never     PHYSICAL EXAM: Vitals:   06/04/23 1233  BP: (!) 168/82  Pulse: 85  SpO2: 98%   General: No acute distress Head:  Normocephalic/atraumatic Skin/Extremities: No rash, no edema Neurological Exam: Mental status: alert and awake, no dysarthria or aphasia, Fund of knowledge is appropriate.  Attention and concentration are normal.    Cranial nerves: CN I: not tested CN II: dilated left pupil (chronic), visual fields intact CN III, IV, VI:  full range of motion, no nystagmus, no ptosis CN V: facial sensation intact CN VII: upper and lower face symmetric CN VIII: hearing intact to conversation Bulk & Tone: normal, no fasciculations. Motor: 5/5 throughout with no pronator drift. Sensation: intact to light touch, cold, pin, vibration sense.  No extinction to double simultaneous stimulation.  Romberg test negative Deep Tendon Reflexes: brisk +2 on both UE and bilateral patella, +1 left ankle jerk, unable to elicit on right ankle, negative Hoffman sign, no ankle clonus Cerebellar: no incoordination on finger to nose testing Gait: narrow-based and steady, able to tandem walk adequately. Tremor: none   IMPRESSION: This is a pleasant 78 year old right-handed woman with a history of hypertension, hyperlipidemia, SCC, presenting for evaluation of recurrent episodes of transient  left leg weakness. She has had 3 similar episodes in the past 5 months, most recently 03/29/23. Her neurological exam today is normal. Etiology of symptoms unclear, brain MRI no  acute changes. We discussed different causes of transient recurrent symptoms. MRA head and neck will be ordered to assess for flow-limiting stenosis. We will do a 1-hour EEG, if normal, a 24-hour EEG will be ordered as part of seizure workup. If TIA/seizure workup unrevealing, we will plan for an EMG/NCV of the left lower extremity. She was advised to restart baby aspirin, continue control of vascular risk factors. Follow-up after tests, call for any changes.   Thank you for allowing me to participate in the care of this patient. Please do not hesitate to call for any questions or concerns.   Patrcia Dolly, M.D.  CC: Dr. Lovell Sheehan, Dr. Everlene Other

## 2023-06-14 ENCOUNTER — Encounter: Payer: Self-pay | Admitting: Neurology

## 2023-06-19 ENCOUNTER — Other Ambulatory Visit: Payer: Self-pay

## 2023-06-19 DIAGNOSIS — R29898 Other symptoms and signs involving the musculoskeletal system: Secondary | ICD-10-CM

## 2023-06-20 ENCOUNTER — Telehealth: Payer: Self-pay | Admitting: Neurology

## 2023-06-20 ENCOUNTER — Encounter: Payer: Self-pay | Admitting: Neurology

## 2023-06-20 ENCOUNTER — Other Ambulatory Visit: Payer: Self-pay

## 2023-06-20 DIAGNOSIS — R569 Unspecified convulsions: Secondary | ICD-10-CM

## 2023-06-20 DIAGNOSIS — G459 Transient cerebral ischemic attack, unspecified: Secondary | ICD-10-CM

## 2023-06-20 NOTE — Telephone Encounter (Signed)
 Order has been updated.

## 2023-06-20 NOTE — Telephone Encounter (Signed)
 [facility] ([facility]hylicia) cld for Pt., missing ICD code for seizures to be approved, would need that provided by phone and "ask them to place the new order on the prior order for Pt.

## 2023-06-22 ENCOUNTER — Ambulatory Visit
Admission: RE | Admit: 2023-06-22 | Discharge: 2023-06-22 | Disposition: A | Discharge status: Home / Self Care | Payer: Medicare PPO | Source: Ambulatory Visit | Attending: Neurology | Admitting: Neurology

## 2023-06-22 ENCOUNTER — Other Ambulatory Visit: Payer: Medicare PPO

## 2023-06-22 DIAGNOSIS — R569 Unspecified convulsions: Secondary | ICD-10-CM

## 2023-06-22 DIAGNOSIS — G459 Transient cerebral ischemic attack, unspecified: Secondary | ICD-10-CM

## 2023-06-22 DIAGNOSIS — R29898 Other symptoms and signs involving the musculoskeletal system: Secondary | ICD-10-CM

## 2023-07-01 ENCOUNTER — Ambulatory Visit: Payer: Medicare PPO | Admitting: Neurology

## 2023-07-01 DIAGNOSIS — R29898 Other symptoms and signs involving the musculoskeletal system: Secondary | ICD-10-CM | POA: Diagnosis not present

## 2023-07-10 NOTE — Procedures (Signed)
 ELECTROENCEPHALOGRAM REPORT  Date of Study: 07/01/2023  Patient's Name: Marcia Harvey MRN: [id number] Date of Birth: 1945/09/21  Referring Provider: Dr. Patrcia Dolly  Clinical History: This is a 78 year old woman with recurrent episodes of transient left leg weakness. EEG for classification.  CNS Active Medications: Sertraline, Trazodone  Technical Summary: A multichannel digital 1-hour EEG recording measured by the international 10-20 system with electrodes applied with paste and impedances below 5000 ohms performed in our laboratory with EKG monitoring in an awake patient.  Hyperventilation was not performed. Photic stimulation was performed.  The digital EEG was referentially recorded, reformatted, and digitally filtered in a variety of bipolar and referential montages for optimal display.    Description: The patient is awake during the recording.  During maximal wakefulness, there is a symmetric, medium voltage 10 Hz posterior dominant rhythm that attenuates with eye opening.  The record is symmetric.  Sleep was not captured. Photic stimulation did not elicit any abnormalities.  There were no epileptiform discharges or electrographic seizures seen.    EKG lead was unremarkable.  Impression: This 1-hour awake EEG is normal.    Clinical Correlation: A normal EEG does not exclude a clinical diagnosis of epilepsy.  If further clinical questions remain, prolonged EEG may be helpful.  Clinical correlation is advised.   Patrcia Dolly, M.D.

## 2023-07-19 ENCOUNTER — Other Ambulatory Visit: Payer: Self-pay

## 2023-07-24 LAB — SURGICAL PATHOLOGY

## 2023-07-25 ENCOUNTER — Telehealth: Payer: Self-pay | Admitting: *Deleted

## 2023-07-25 ENCOUNTER — Telehealth: Payer: Self-pay

## 2023-07-25 DIAGNOSIS — R569 Unspecified convulsions: Secondary | ICD-10-CM

## 2023-07-25 NOTE — Telephone Encounter (Signed)
 Pt called an informed that  MRA was normal, no narrowing seen in blood vessels. The EEG was normal. We had talked about doing a 24-hour EEG if these were normal. 24 hour EEG scheduled   Pt just found out that she has breast cancer ,

## 2023-07-25 NOTE — Telephone Encounter (Signed)
-----   Message from Van Clines sent at 07/22/2023 11:02 PM EDT ----- Pls let her know the MRA was normal, no narrowing seen in blood vessels. The EEG was normal. We had talked about doing a 24-hour EEG if these were normal. Pls order if she agrees, thanks

## 2023-07-25 NOTE — Telephone Encounter (Signed)
 Spoke to patient to confirm upcoming morning [facility] clinic appointment on 3/19, paperwork will be sent via [employer].  Gave location and time, also informed patient that the surgeon's office would be calling as well to get information from them similar to the packet that they will be receiving so make sure to do both.  Reminded patient that all providers will be coming to the clinic to see them HERE and if they had any questions to not hesitate to reach back out to myself or their navigators.

## 2023-07-26 ENCOUNTER — Other Ambulatory Visit: Payer: Self-pay | Admitting: Diagnostic Radiology

## 2023-07-26 DIAGNOSIS — C50912 Malignant neoplasm of unspecified site of left female breast: Secondary | ICD-10-CM

## 2023-07-29 ENCOUNTER — Encounter: Payer: Self-pay | Admitting: *Deleted

## 2023-07-29 DIAGNOSIS — C50611 Malignant neoplasm of axillary tail of right female breast: Secondary | ICD-10-CM | POA: Insufficient documentation

## 2023-07-30 NOTE — Progress Notes (Unsigned)
 [facility] CONSULT NOTE  Patient Care Team: Tracey Harries, MD as PCP - General (Family Medicine) Van Clines, MD as Consulting Physician (Neurology) Pershing Proud, RN as Oncology Nurse Navigator Donnelly Angelica, RN as Oncology Nurse Navigator Harriette Bouillon, MD as Consulting Physician (General Surgery) Rachel Moulds, MD as Consulting Physician (Hematology and Oncology) Antony Blackbird, MD as Consulting Physician (Radiation Oncology)  CHIEF COMPLAINTS/PURPOSE OF CONSULTATION:  Newly diagnosed breast cancer  HISTORY OF PRESENTING ILLNESS:  Marcia Harvey 78 y.o. female is here because of recent diagnosis of {left/right:[id number]}   I reviewed her records extensively and collaborated the history with the patient.  SUMMARY OF ONCOLOGIC HISTORY: Oncology History   No history exists.    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. Findings: Nondisplaced fx of posterior right 11th rib   GERD (gastroesophageal reflux disease)    Hypertension    Seizures (HCC)    been through testing, no issues with seizures or epilepsy   Syncopal episodes    seizure with episode   White matter disease     SURGICAL HISTORY: Past Surgical History:  Procedure Laterality Date   BREAST LUMPECTOMY WITH RADIOACTIVE SEED LOCALIZATION Left 06/21/2020   Procedure: LEFT BREAST LUMPECTOMY WITH RADIOACTIVE SEED LOCALIZATION;  Surgeon: Manus Rudd, MD;  Location: [facility];  Service: General;  Laterality: Left;   BUNIONECTOMY Bilateral 1978   COLONOSCOPY     EYE SURGERY     MELANOMA EXCISION Right 08/02/2020   Procedure: 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;  Surgeon: Manus Rudd, MD;  Location: [facility];  Service: General;  Laterality: Right;  ROOM 8 STARTING AT 04:15PM FOR 45 MIN    SOCIAL HISTORY: Social History   Socioeconomic History   Marital status: Single    Spouse name: Not on file   Number of children: Not on file   Years of education: Not on file   Highest education level: Not on file  Occupational History   Not on file  Tobacco Use   Smoking status: Former    Current packs/day: 0.00    Types: Cigarettes    Quit date: 05/21/2003    Years since quitting: 20.2   Smokeless tobacco: Never  Vaping Use   Vaping status: Never Used  Substance and Sexual Activity   Alcohol use: Yes    Comment: 3-4x per year   Drug use: No   Sexual activity: Not Currently    Birth control/protection: Post-menopausal  Other Topics Concern   Not on file  Social History Narrative   Are you right handed or left handed? right   Are you currently employed ? no   What is your current occupation? Retired    Do you live at home alone? Alone    Who lives with you? NA    What type of home do you live in: 1 story or 2 story?  1 story        Social Drivers of Corporate investment banker Strain: Low Risk  (07/22/2023)   Received from [facility]   Overall [employer] (  CARDIA)    Difficulty of Paying Living Expenses: Not very hard  Food Insecurity: No Food Insecurity (07/22/2023)   Received from [facility]   Hunger Vital Sign    Worried About Running Out of Food in the Last Year: Never true    Ran Out of Food in the Last Year: Never true  Transportation Needs: No Transportation Needs (07/22/2023)   Received from [facility] - Transportation    Lack of Transportation (Medical): No    Lack of Transportation (Non-Medical): No  Physical Activity: Sufficiently Active (07/22/2023)   Received [facility]   Exercise Vital Sign    Days of Exercise per Week: 5 days    Minutes of Exercise per Session: 60 min  Stress:  Stress Concern Present (07/22/2023)   Received from [facility] of Occupational Health - Occupational Stress Questionnaire    Feeling of Stress : To some extent  Social Connections: Socially Integrated (07/22/2023)   Received from [facility]   Social Network    How would you rate your social network (family, work, friends)?: Good participation with social networks  Intimate Partner Violence: Not At Risk (07/22/2023)   Received from [facility]   HITS    Over the last 12 months how often did your partner physically hurt you?: Never    Over the last 12 months how often did your partner insult you or talk down to you?: Never    Over the last 12 months how often did your partner threaten you with physical harm?: Never    Over the last 12 months how often did your partner scream or curse at you?: Never    FAMILY HISTORY: Family History  Problem Relation Age of Onset   Colon cancer Mother    Diabetes Maternal Grandfather    Diabetes Paternal Grandmother     ALLERGIES:  is allergic to codeine, gadolinium derivatives, iohexol, shellfish-derived products, bee pollen, iodine, wound dressing adhesive, and tape.  MEDICATIONS:  Current Outpatient Medications  Medication Sig Dispense Refill   ascorbic acid (VITAMIN C) 500 MG tablet Take by mouth.     atorvastatin (LIPITOR) 40 MG tablet Take 40 mg by mouth daily.     augmented betamethasone dipropionate (DIPROLENE-AF) 0.05 % cream Apply topically 2 (two) times daily.     b complex vitamins tablet Take 1 tablet by mouth daily.     carboxymethylcellul-glycerin (OPTIVE) 0.5-0.9 % ophthalmic solution Apply to eye.     Cholecalciferol (VITAMIN D3) 2000 UNITS TABS Take 2,000 Units by mouth daily.     cyclobenzaprine (FLEXERIL) 5 MG tablet Take 2 tablets (10 mg total) by mouth 2 (two) times daily as needed for up to 20 doses for muscle spasms. (Patient not taking: Reported on 06/04/2023) 20 tablet 0    lisinopril-hydrochlorothiazide (PRINZIDE,ZESTORETIC) 10-12.5 MG per tablet Take 1 tablet by mouth daily. NEED VISIT, LABS!!  2nd NOTICE! 30 tablet 0   omeprazole (PRILOSEC) 40 MG capsule TAKE 1 CAPSULE BY MOUTH EVERY DAY     sertraline (ZOLOFT) 25 MG tablet TAKE 1 TABLET(25 MG) BY MOUTH DAILY     traZODone (DESYREL) 50 MG tablet 25 to 50 mg at bedtime to help with sleep     No current facility-administered medications for this visit.    REVIEW OF SYSTEMS:   Constitutional: Denies fevers, chills or abnormal night sweats Eyes: Denies blurriness of vision, double vision or watery eyes Ears, nose, mouth, throat, and face: Denies mucositis or  sore throat Respiratory: Denies cough, dyspnea or wheezes Cardiovascular: Denies palpitation, chest discomfort or lower extremity swelling Gastrointestinal:  Denies nausea, heartburn or change in bowel habits Skin: Denies abnormal skin rashes Lymphatics: Denies new lymphadenopathy or easy bruising Neurological:Denies numbness, tingling or new weaknesses Behavioral/Psych: Mood is stable, no new changes  Breast: *** Denies any palpable lumps or discharge All other systems were reviewed with the patient and are negative.  PHYSICAL EXAMINATION: ECOG PERFORMANCE STATUS: {CHL ONC ECOG PS:[id number]}  There were no vitals filed for this visit. There were no vitals filed for this visit.  GENERAL:alert, no distress and comfortable SKIN: skin color, texture, turgor are normal, no rashes or significant lesions EYES: normal, conjunctiva are pink and non-injected, sclera clear OROPHARYNX:no exudate, no erythema and lips, buccal mucosa, and tongue normal  NECK: supple, thyroid normal size, non-tender, without nodularity LYMPH:  no palpable lymphadenopathy in the cervical, axillary or inguinal LUNGS: clear to auscultation and percussion with normal breathing effort HEART: regular rate & rhythm and no murmurs and no lower extremity edema ABDOMEN:abdomen soft,  non-tender and normal bowel sounds Musculoskeletal:no cyanosis of digits and no clubbing  PSYCH: alert & oriented x 3 with fluent speech NEURO: no focal motor/sensory deficits BREAST:*** No palpable nodules in breast. No palpable axillary or supraclavicular lymphadenopathy (exam performed in the presence of a chaperone)   LABORATORY DATA:  I have reviewed the data as listed Lab Results  Component Value Date   WBC 8.6 06/07/2014   HGB 15.0 06/07/2014   HCT 44.0 06/07/2014   MCV 83.9 06/07/2014   PLT 273 06/07/2014   Lab Results  Component Value Date   NA 139 07/29/2020   K 3.5 07/29/2020   CL 104 07/29/2020   CO2 27 07/29/2020    RADIOGRAPHIC STUDIES: I have personally reviewed the radiological reports and agreed with the findings in the report.  ASSESSMENT AND PLAN:  No problem-specific Assessment & Plan notes found for this encounter.   All questions were answered. The patient knows to call the clinic with any problems, questions or concerns.    Rachel Moulds, MD 07/30/23

## 2023-07-31 ENCOUNTER — Ambulatory Visit
Admission: RE | Admit: 2023-07-31 | Discharge: 2023-07-31 | Disposition: A | Discharge status: Home / Self Care | Source: Ambulatory Visit | Attending: Radiation Oncology | Admitting: Radiation Oncology

## 2023-07-31 ENCOUNTER — Encounter: Payer: Self-pay | Admitting: Hematology and Oncology

## 2023-07-31 ENCOUNTER — Ambulatory Visit: Attending: Surgery | Admitting: Physical Therapy

## 2023-07-31 ENCOUNTER — Encounter: Payer: Self-pay | Admitting: *Deleted

## 2023-07-31 ENCOUNTER — Encounter: Payer: Self-pay | Admitting: Physical Therapy

## 2023-07-31 ENCOUNTER — Encounter: Payer: Self-pay | Admitting: Genetic Counselor

## 2023-07-31 ENCOUNTER — Inpatient Hospital Stay: Admitting: Licensed Clinical Social Worker

## 2023-07-31 ENCOUNTER — Inpatient Hospital Stay: Admitting: Genetic Counselor

## 2023-07-31 ENCOUNTER — Other Ambulatory Visit: Payer: Self-pay

## 2023-07-31 ENCOUNTER — Inpatient Hospital Stay: Attending: Hematology and Oncology | Admitting: Hematology and Oncology

## 2023-07-31 ENCOUNTER — Inpatient Hospital Stay

## 2023-07-31 VITALS — BP 164/74 | HR 89 | Temp 97.8°F | Resp 16 | Wt 173.2 lb

## 2023-07-31 DIAGNOSIS — C773 Secondary and unspecified malignant neoplasm of axilla and upper limb lymph nodes: Secondary | ICD-10-CM | POA: Insufficient documentation

## 2023-07-31 DIAGNOSIS — R293 Abnormal posture: Secondary | ICD-10-CM | POA: Diagnosis present

## 2023-07-31 DIAGNOSIS — Z79899 Other long term (current) drug therapy: Secondary | ICD-10-CM | POA: Insufficient documentation

## 2023-07-31 DIAGNOSIS — C50611 Malignant neoplasm of axillary tail of right female breast: Secondary | ICD-10-CM

## 2023-07-31 DIAGNOSIS — Z8042 Family history of malignant neoplasm of prostate: Secondary | ICD-10-CM

## 2023-07-31 DIAGNOSIS — Z171 Estrogen receptor negative status [ER-]: Secondary | ICD-10-CM | POA: Diagnosis not present

## 2023-07-31 DIAGNOSIS — Z800 Family history of malignant neoplasm of digestive organs: Secondary | ICD-10-CM

## 2023-07-31 LAB — CMP (CANCER CENTER ONLY)
ALT: 9 U/L (ref 0–44)
AST: 24 U/L (ref 15–41)
Albumin: 4.2 g/dL (ref 3.5–5.0)
Alkaline Phosphatase: 122 U/L (ref 38–126)
Anion gap: 8 (ref 5–15)
BUN: 21 mg/dL (ref 8–23)
CO2: 27 mmol/L (ref 22–32)
Calcium: 9.3 mg/dL (ref 8.9–10.3)
Chloride: 104 mmol/L (ref 98–111)
Creatinine: 0.79 mg/dL (ref 0.44–1.00)
GFR, Estimated: >60 mL/min (ref >60)
Glucose, Bld: 116 mg/dL — ABNORMAL HIGH (ref 70–99)
Potassium: 3.6 mmol/L (ref 3.5–5.1)
Sodium: 139 mmol/L (ref 135–145)
Total Bilirubin: 0.3 mg/dL (ref 0.0–1.2)
Total Protein: 7.4 g/dL (ref 6.5–8.1)

## 2023-07-31 LAB — GENETIC SCREENING ORDER

## 2023-07-31 NOTE — Progress Notes (Signed)
 [facility] Clinical Social Work  Initial Assessment   Marcia WORTH "Marcia Harvey" is a 78 y.o. year old female accompanied by friend, Melissa. Clinical Social Work was referred by  [facility]  for assessment of psychosocial needs.   SDOH (Social Determinants of Health) assessments performed: Yes SDOH Interventions    Flowsheet Row Clinical Support from 07/31/2023 in [facility] - A Dept Of [place]. [facility]  SDOH Interventions   Food Insecurity Interventions Intervention Not Indicated  Housing Interventions Intervention Not Indicated  Transportation Interventions Intervention Not Indicated  Utilities Interventions Intervention Not Indicated       SDOH Screenings   Food Insecurity: No Food Insecurity (07/31/2023)  Housing: Low Risk  (07/31/2023)  Transportation Needs: No Transportation Needs (07/31/2023)  Utilities: Not At Risk (07/31/2023)  Depression (PHQ2-9): Low Risk  (07/31/2023)  Financial Resource Strain: Low Risk  (07/22/2023)   Received from [facility]  Physical Activity: Sufficiently Active (07/22/2023)   Received from [facility]  Social Connections: Socially Integrated (07/22/2023)   Received from [facility]  Stress: Stress Concern Present (07/22/2023)   Received from [facility]  Tobacco Use: Medium Risk (07/31/2023)     Distress Screen completed: No     No data to display            Family/Social Information:  Housing Arrangement: patient lives alone with her dog Family members/support persons in your life? Friends- core 4 friends but large and varied friend groups Transportation concerns: no  Employment: Retired Retail buyer.  Income source: Health and safety inspector concerns: No Type of concern: None Food access concerns: no Advanced directives: Yes-not on file Services Currently in place:  [employer]; sertraline  Coping/ Adjustment to diagnosis: Patient understands treatment plan and what happens next?  yes, feels better after speaking with the medical team and knowing next steps, even though more information needs to be gathered through additional scans Concerns about diagnosis and/or treatment:  anxiety with needles/ procedures Patient reported stressors: Adjusting to my illness Patient enjoys time with family/ friends and dogs, horses, outdoor work Current coping skills/ strengths: Ability for insight , Capable of independent living , Manufacturing systems engineer , Motivation for treatment/growth , Special hobby/interest , and Supportive family/friends     SUMMARY: Current SDOH Barriers:  No major barriers identified today  Clinical Social Work Clinical Goal(s):  No clinical social work goals at this time  Interventions: Discussed common feeling and emotions when being diagnosed with cancer, and the importance of support during treatment Informed patient of the support team roles and support services at [facility] Provided CSW contact information and encouraged patient to call with any questions or concerns   Follow Up Plan: Patient will contact CSW with any support or resource needs Patient verbalizes understanding of plan: Yes    Georgana Romain E Avyanna Spada, LCSW Clinical Social Worker [facility]

## 2023-07-31 NOTE — Assessment & Plan Note (Signed)
 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  She is a very robust 71, very active and continues to do all her activities of daily living as well as heavy yard work.  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 Poorly differentiated, lacks classic markers. MRI and PET CT planned to confirm breast mass and rule out other origins. Surgery followed by TC chemotherapy planned. Discussed cold cap for hair preservation. MRD testing post-therapy for recurrence monitoring. - Order MRI of the breast with gadolinium contrast. - Order PET CT. - Plan for surgery followed by chemotherapy with TC regimen (docetaxel and cyclophosphamide). - Discuss use of cold cap for hair preservation. - Consider MRD testing post-therapy for monitoring recurrence.  Iodine Contrast Allergy Allergy to iodine contrast noted. Gadolinium contrast safe for MRI.  partial Seizure (Possible) Ambulatory EEG postponed to prioritize breast cancer treatment. - Postpone ambulatory EEG.

## 2023-07-31 NOTE — Progress Notes (Signed)
 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 female who is presenting to the office today for evaluation of her newly diagnosed breast cancer. She is accompanied by a good friend. She is doing well overall.   She had routine screening mammography on 07/10/23 showing 2 possible masses in the right axilla. She underwent a right axillary ultrasound on 07/15/23 showing a single mass in the right axillary tail area measuring 3.3 cm in the greatest extent. The other mass demonstrated on her screening mammography correlates with a normal appearing lymph node.   Biopsy of the right axillary mass on 07/19/23 showed findings consistent with poorly differentiated carcinoma. Prognostic indicators significant for: estrogen receptor, 0% negative and progesterone receptor, 0% negative. Proliferation marker Ki67 at 10%. HER2 negative.  Menarche: 78 years old GP: 0 LMP: 1995 Contraceptive: never used HRT: yes, for 2-3 years around 1999   The patient was referred today for presentation in the multidisciplinary conference.  Radiology studies and pathology slides were presented there for review and discussion of treatment options.  A consensus was discussed regarding potential next steps.  PREVIOUS RADIATION  THERAPY: No  PAST MEDICAL HISTORY:  Past Medical History:  Diagnosis Date   Anxiety    Anxiety    Arthritis    Cancer (HCC) 07/2020   SCC chest   Depression    Fall from horse 01/27/12   CT chest/Abd/Pelvis done at [facility]. Findings: Nondisplaced fx of posterior right 11th rib   GERD (gastroesophageal reflux disease)    Hypertension    Seizures (HCC)    been through testing, no issues with seizures or epilepsy   Syncopal episodes    seizure with episode   White matter disease     PAST SURGICAL HISTORY: Past Surgical History:  Procedure Laterality Date   BREAST LUMPECTOMY WITH RADIOACTIVE SEED LOCALIZATION Left 06/21/2020   Procedure: LEFT BREAST LUMPECTOMY WITH RADIOACTIVE SEED LOCALIZATION;  Surgeon: Manus Rudd, MD;  Location: [facility];  Service: General;  Laterality: Left;   BUNIONECTOMY Bilateral 1978   COLONOSCOPY     EYE SURGERY     MELANOMA EXCISION Right 08/02/2020   Procedure: 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;  Surgeon: Manus Rudd, MD;  Location:  SURGERY CENTER;  Service: General;  Laterality: Right;  ROOM 8 STARTING AT 04:15PM FOR 45 MIN    FAMILY HISTORY:  Family History  Problem Relation Age of Onset   Colon cancer Mother    Diabetes Maternal Grandfather    Diabetes Paternal Grandmother    Lung cancer Maternal Uncle     SOCIAL HISTORY:  Social History   Socioeconomic History   Marital status: Single    Spouse name: Not on file   Number of children: Not on file  Years of education: Not on file   Highest education level: Not on file  Occupational History   Not on file  Tobacco Use   Smoking status: Former    Current packs/day: 0.00    Types: Cigarettes    Quit date: 05/21/2003    Years since quitting: 20.2   Smokeless tobacco: Never  Vaping Use   Vaping status: Never Used  Substance and Sexual Activity   Alcohol use: Yes    Comment: 3-4x per year   Drug use: No   Sexual activity: Not  Currently    Birth control/protection: Post-menopausal  Other Topics Concern   Not on file  Social History Narrative   Are you right handed or left handed? right   Are you currently employed ? no   What is your current occupation? Retired    Do you live at home alone? Alone    Who lives with you? NA    What type of home do you live in: 1 story or 2 story?  1 story        Social Drivers of Corporate investment banker Strain: Low Risk  (07/22/2023)   Received from [facility]   Overall Financial Resource Strain (CARDIA)    Difficulty of Paying Living Expenses: Not very hard  Food Insecurity: No Food Insecurity (07/22/2023)   Received from [facility]   Hunger Vital Sign    Worried About Running Out of Food in the Last Year: Never true    Ran Out of Food in the Last Year: Never true  Transportation Needs: No Transportation Needs (07/22/2023)   Received from [facility] - Transportation    Lack of Transportation (Medical): No    Lack of Transportation (Non-Medical): No  Physical Activity: Sufficiently Active (07/22/2023)   Received from [facility]   Exercise Vital Sign    Days of Exercise per Week: 5 days    Minutes of Exercise per Session: 60 min  Stress: Stress Concern Present (07/22/2023)   Received from [facility] - Occupational Stress Questionnaire    Feeling of Stress : To some extent  Social Connections: Socially Integrated (07/22/2023)   Received from [facility]   Social Network    How would you rate your social network (family, work, friends)?: Good participation with social networks    ALLERGIES:  Allergies  Allergen Reactions   Codeine Hives   Gadolinium Derivatives Anaphylaxis   Iohexol Hives    Treated for anaphylactic rxn. during  CT   Shellfish-Derived Products Shortness Of Breath   Bee Pollen    Iodine    Wound Dressing Adhesive    Tape Rash    Irritating red rash    MEDICATIONS:   Current Outpatient Medications  Medication Sig Dispense Refill   ascorbic acid (VITAMIN C) 500 MG tablet Take by mouth.     atorvastatin (LIPITOR) 40 MG tablet Take 40 mg by mouth daily.     augmented betamethasone dipropionate (DIPROLENE-AF) 0.05 % cream Apply topically 2 (two) times daily.     b complex vitamins tablet Take 1 tablet by mouth daily.     carboxymethylcellul-glycerin (OPTIVE) 0.5-0.9 % ophthalmic solution Apply to eye.     Cholecalciferol (VITAMIN D3) 2000 UNITS TABS Take 2,000 Units by mouth daily.     lisinopril-hydrochlorothiazide (PRINZIDE,ZESTORETIC) 10-12.5 MG per tablet Take 1 tablet by mouth daily. NEED VISIT, LABS!!  2nd NOTICE! 30 tablet 0   omeprazole (  PRILOSEC) 40 MG capsule TAKE 1 CAPSULE BY MOUTH EVERY DAY     sertraline (ZOLOFT) 25 MG tablet TAKE 1 TABLET(25 MG) BY MOUTH DAILY     traZODone (DESYREL) 50 MG tablet 25 to 50 mg at bedtime to help with sleep     No current facility-administered medications for this encounter.    REVIEW OF SYSTEMS: A 10+ POINT REVIEW OF SYSTEMS WAS OBTAINED including neurology, dermatology, psychiatry, cardiac, respiratory, lymph, extremities, GI, GU, musculoskeletal, constitutional, reproductive, HEENT. On the provided form, she reports loss of sleep, mid to lower back pain (cramping and muscle aches), wearing glasses and contacts, double vision, blurred vision, runny nose, sinus problems, heartburn, a breast lump, a history of skin cancer, bruising easily, joint pain, arthritis, weakness, and anxiety. She denies any other symptoms.    PHYSICAL EXAM:      07/31/2023  Vitals with BMI   Height   Weight 173 lbs 3 oz   BMI   Systolic 164 !   Diastolic 74 !   Pulse 89    Lungs are clear to auscultation bilaterally. Heart has regular rate and rhythm. No palpable cervical, supraclavicular, or axillary adenopathy. Abdomen soft, non-tender, normal bowel sounds. Breast: Right breast/axilla with no palpable mass, nipple discharge, or  bleeding. Left breast with no palpable mass, nipple discharge, or bleeding. right axilla with 3.0 cm firm lymph node which is mobile and mildly tender to palpation.   KPS = 90  100 - Normal; no complaints; no evidence of disease. 90   - Able to carry on normal activity; minor signs or symptoms of disease. 80   - Normal activity with effort; some signs or symptoms of disease. 47   - Cares for self; unable to carry on normal activity or to do active work. 60   - Requires occasional assistance, but is able to care for most of his personal needs. 50   - Requires considerable assistance and frequent medical care. 40   - Disabled; requires special care and assistance. 30   - Severely disabled; hospital admission is indicated although death not imminent. 20   - Very sick; hospital admission necessary; active supportive treatment necessary. 10   - Moribund; fatal processes progressing rapidly. 0     - Dead  Karnofsky DA, Abelmann WH, Craver LS and Burchenal [facility] [phone]) The use of the nitrogen mustards in the palliative treatment of carcinoma: with particular reference to bronchogenic carcinoma Cancer 1 634-56  LABORATORY DATA:  Lab Results  Component Value Date   WBC 8.6 06/07/2014   HGB 15.0 06/07/2014   HCT 44.0 06/07/2014   MCV 83.9 06/07/2014   PLT 273 06/07/2014   Lab Results  Component Value Date   NA 139 07/29/2020   K 3.5 07/29/2020   CL 104 07/29/2020   CO2 27 07/29/2020   No results found for: "ALT", "AST", "GGT", "ALKPHOS", "BILITOT"  PULMONARY FUNCTION TEST:   Review Flowsheet        No data to display          RADIOGRAPHY: EEG adult Result Date: 07/01/2023 Van Clines, MD     07/10/2023  3:51 PM ELECTROENCEPHALOGRAM REPORT Date of Study: 07/01/2023 Patient's Name: SALOTE WEIDMANN MRN: [id number] Date of Birth: 1946/03/27 Referring Provider: Dr. Patrcia Dolly Clinical History: This is a 78 year old woman with recurrent episodes of transient left leg weakness. EEG for  classification. CNS Active Medications: Sertraline, Trazodone Technical Summary: A multichannel digital 1-hour EEG recording measured by  the international 10-20 system with electrodes applied with paste and impedances below 5000 ohms performed in our laboratory with EKG monitoring in an awake patient.  Hyperventilation was not performed. Photic stimulation was performed.  The digital EEG was referentially recorded, reformatted, and digitally filtered in a variety of bipolar and referential montages for optimal display.  Description: The patient is awake during the recording.  During maximal wakefulness, there is a symmetric, medium voltage 10 Hz posterior dominant rhythm that attenuates with eye opening.  The record is symmetric.  Sleep was not captured. Photic stimulation did not elicit any abnormalities.  There were no epileptiform discharges or electrographic seizures seen.  EKG lead was unremarkable. Impression: This 1-hour awake EEG is normal.  Clinical Correlation: A normal EEG does not exclude a clinical diagnosis of epilepsy.  If further clinical questions remain, prolonged EEG may be helpful.  Clinical correlation is advised. Patrcia Dolly, M.D.      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  Recommendations are for an MRI of both breasts for further evaluation and a PET scan to rule out distant metastasis as conceivably this could be a lymph node metastasis from non-breast origin.  Today we discussed radiation therapy in general terms but also discussed that radiation therapy may not be recommended as part of her overall course of treatment depending on further evaluation with imaging studies and surgery.  PLAN:  Genetics  MRI/PET scan  Surgery TBD Radiation therapy to be determined     ------------------------------------------------  Billie Lade, PhD, MD  This document serves as a record of services personally performed by Antony Blackbird, MD. It was created on his behalf by Neena Rhymes, a trained medical scribe. The creation of this record is based on the scribe's personal observations and the provider's statements to them. This document has been checked and approved by the attending provider.

## 2023-07-31 NOTE — Therapy (Signed)
 OUTPATIENT PHYSICAL THERAPY BREAST CANCER BASELINE EVALUATION   Patient Name: Marcia Harvey MRN: [id number] DOB:1945-09-05, 78 y.o., female Today's Date: 07/31/2023  END OF SESSION:  PT End of Session - 07/31/23 1051     Visit Number 1    Number of Visits 2    Date for PT Re-Evaluation 09/25/23    PT Start Time 0924    PT Stop Time 1006    PT Time Calculation (min) 42 min    Activity Tolerance Patient tolerated treatment well    Behavior During Therapy [facility] for tasks assessed/performed             Past Medical History:  Diagnosis Date   Anxiety    Anxiety    Arthritis    Cancer (HCC) 07/2020   SCC chest   Depression    Fall from horse 01/27/12   CT chest/Abd/Pelvis done at [facility]. Findings: Nondisplaced fx of posterior right 11th rib   GERD (gastroesophageal reflux disease)    Hypertension    Seizures (HCC)    been through testing, no issues with seizures or epilepsy   Syncopal episodes    seizure with episode   White matter disease    Past Surgical History:  Procedure Laterality Date   BREAST LUMPECTOMY WITH RADIOACTIVE SEED LOCALIZATION Left 06/21/2020   Procedure: LEFT BREAST LUMPECTOMY WITH RADIOACTIVE SEED LOCALIZATION;  Surgeon: Manus Rudd, MD;  Location: [facility];  Service: General;  Laterality: Left;   BUNIONECTOMY Bilateral 1978   COLONOSCOPY     EYE SURGERY     MELANOMA EXCISION Right 08/02/2020   Procedure: 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   PERTINENT HISTORY:  Patient was diagnosed on 07/10/2023 with right axillary node metastatic carcinoma. It measures 3 cm and is located in the axillary region. It is triple negative with a Ki67 of 10%. She will undergo further scans to ensure this is the primary location of her carcinoma.  PATIENT GOALS:   reduce lymphedema risk and learn post op HEP.   PAIN:  Are you having pain? No - she has chronic lower back achy pain from degenerative disc disease but no pain currently.  PRECAUTIONS: Active CA   RED FLAGS: None   HAND DOMINANCE: right  WEIGHT BEARING RESTRICTIONS: No  FALLS:  Has patient fallen in last 6 months? Yes. 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  COGNITION: Overall cognitive status: Within functional limits for tasks assessed    POSTURE:  Forward head and rounded shoulders posture  UPPER EXTREMITY AROM/PROM:  A/PROM RIGHT   eval   Shoulder extension 49  Shoulder flexion 156   Shoulder abduction 162  Shoulder internal rotation 63  Shoulder external rotation 86    (Blank rows = not tested)  A/PROM LEFT   eval  Shoulder extension 50  Shoulder flexion 145  Shoulder abduction 165  Shoulder internal rotation 77  Shoulder external rotation 83    (Blank rows = not tested)  CERVICAL AROM: All within normal limits:    Percent limited  Flexion WNL  Extension WNL  Right lateral flexion WNL  Left lateral flexion 50% limited  Right rotation WNL  Left rotation 50% limited    UPPER EXTREMITY STRENGTH: WFL  LYMPHEDEMA ASSESSMENTS (in cm):   LANDMARK RIGHT   eval  10 cm proximal to olecranon process 29.4  Olecranon process 24.4  10 cm proximal to ulnar styloid process 23.2  Just proximal to ulnar styloid process 16.3  Across hand at thumb web space 18.6  At base of 2nd digit 6.6  (Blank rows = not tested)  LANDMARK LEFT   eval  10 cm proximal to olecranon process 28.5  Olecranon process 24.1  10 cm proximal to ulnar styloid process 21.6  Just proximal to ulnar styloid process 16  Across hand at thumb web space 18.5  At base of 2nd digit 6.4  (Blank rows = not tested)  L-DEX LYMPHEDEMA SCREENING:  The patient was assessed using the L-Dex machine today to produce a lymphedema index baseline score. The patient will be reassessed on a regular basis (typically every 3 months) to obtain new L-Dex scores. If the score is > 6.5 points away from his/her baseline score indicating onset of subclinical lymphedema, it will be recommended to wear a compression garment for 4 weeks, 12 hours per day and then be reassessed. If the score continues to be > 6.5 points from baseline at reassessment, we will initiate lymphedema treatment. Assessing in this manner has a 95% rate of preventing clinically significant lymphedema.   L-DEX FLOWSHEETS - 07/31/23 1000       L-DEX LYMPHEDEMA SCREENING   Measurement Type Unilateral    L-DEX MEASUREMENT EXTREMITY  Upper  Extremity    POSITION  Standing    DOMINANT SIDE Right    At Risk Side Right    BASELINE SCORE (UNILATERAL) 2.2             QUICK DASH SURVEY:  Neldon Mc - 07/31/23 0001     Open a tight or new jar No difficulty    Do heavy household chores (wash walls, wash floors) No difficulty    Carry a shopping bag or briefcase No difficulty    Wash your back No difficulty    Use a knife to cut food No difficulty    Recreational activities in which you take some force or impact through your arm, shoulder, or hand (golf, hammering, tennis) No difficulty    During the past week, to what extent has your arm, shoulder or hand problem interfered with your normal social activities with family, friends, neighbors, or groups? Not at all    During the past week, to what extent has your arm, shoulder or hand problem limited your work or other regular daily activities Not at all    Arm, shoulder, or hand pain. None    Tingling (pins and needles) in your arm, shoulder, or hand None    Difficulty Sleeping No difficulty    DASH Score 0 %              PATIENT EDUCATION:  Education details: Time spent educating patient on aspects of self-care to maximize post op recovery. Patient was educated on where and how to get a post op compression bra to use to reduce post op edema. Patient was also educated on the use of SOZO screenings and surveillance principles for early identification of lymphedema onset. She was instructed to use the post op pillow in the axilla for pressure and pain relief. Patient educated on lymphedema risk reduction and post op shoulder/posture HEP. Person educated: Patient Education method: Explanation, Demonstration, Handout Education comprehension: Patient verbalized understanding and returned demonstration  HOME EXERCISE PROGRAM: Patient was instructed today in a home exercise program today for post op shoulder range of motion. These included active assist shoulder flexion in  sitting, scapular retraction, wall walking with shoulder abduction, and hands behind head external rotation.  She was encouraged to do these twice a day, holding 3 seconds and repeating 5 times when permitted by her physician.   ASSESSMENT:  CLINICAL IMPRESSION: Patient was diagnosed on 07/10/2023 with right axillary node metastatic carcinoma. It measures 3 cm and is located in the axillary region. It is triple negative with a Ki67 of 10%. She will undergo further scans to ensure this is the primary location of her carcinoma.Her multidisciplinary medical team met prior to her assessments to determine a recommended treatment plan. She is planning to have further testing to determine best treatment plan but her surgeon plans at minimum to do a targeted axillary lymph node dissection. She will benefit from a post op PT reassessment to determine needs and from L-Dex screens every 3 months for 2 years to detect subclinical lymphedema.  Pt will benefit from skilled therapeutic intervention to improve on the following deficits: Decreased knowledge of precautions, impaired UE functional use, pain, decreased ROM, postural dysfunction.   PT treatment/interventions: ADL/self-care home management, pt/family education, therapeutic exercise  REHAB POTENTIAL: Excellent  CLINICAL DECISION MAKING: Stable/uncomplicated  EVALUATION COMPLEXITY: Low   GOALS: Goals reviewed with patient? YES  LONG TERM GOALS: (STG=LTG)    Name Target Date Goal status  1 Pt will  be able to verbalize understanding of pertinent lymphedema risk reduction practices relevant to her dx specifically related to skin care.  Baseline:  No knowledge 07/31/2023 Achieved at eval  2 Pt will be able to return demo and/or verbalize understanding of the post op HEP related to regaining shoulder ROM. Baseline:  No knowledge 07/31/2023 Achieved at eval  3 Pt will be able to verbalize understanding of the importance of viewing the post op After  Breast CA Class video for further lymphedema risk reduction education and therapeutic exercise.  Baseline:  No knowledge 07/31/2023 Achieved at eval  4 Pt will demo she has regained full shoulder ROM and function post operatively compared to baselines.  Baseline: See objective measurements taken today. 09/25/2023     PLAN:  PT FREQUENCY/DURATION: EVAL and 1 follow up appointment.   PLAN FOR NEXT SESSION: will reassess 3-4 weeks post op to determine needs.   Patient will follow up at outpatient cancer rehab 3-4 weeks following surgery.  If the patient requires physical therapy at that time, a specific plan will be dictated and sent to the referring physician for approval. The patient was educated today on appropriate basic range of motion exercises to begin post operatively and the importance of viewing the After Breast Cancer class video following surgery.  Patient was educated today on lymphedema risk reduction practices as it pertains to recommendations that will benefit the patient immediately following surgery.  She verbalized good understanding.    Physical Therapy Information for After Breast Cancer Surgery/Treatment:  Lymphedema is a swelling condition that you may be at risk for in your arm if you have lymph nodes removed from the armpit area.  After a sentinel node biopsy, the risk is approximately 5-9% and is higher after an axillary node dissection.  There is treatment available for this condition and it is not life-threatening.  Contact your physician or physical therapist with concerns. You may begin the 4 shoulder/posture exercises (see additional sheet) when permitted by your physician (typically a week after surgery).  If you have drains, you may need to wait until those are removed before beginning range of motion exercises.  A general recommendation is to not lift your arms above shoulder height until drains are removed.  These exercises should be done to your tolerance and gently.   This is not a "no pain/no gain" type of recovery so listen to your body and stretch into the range of motion that you can tolerate, stopping if you have pain.  If you are having immediate reconstruction, ask your plastic surgeon about doing exercises as he or she may want you to wait. We encourage you to attend the free one time ABC (After Breast Cancer) class offered by [facility].  You will learn information related to lymphedema risk, prevention and treatment and additional exercises to regain mobility following surgery.  You can call [phone] for more information.  This is offered the 1st and 3rd Monday of each month.  You only attend the class one time. While undergoing any medical procedure or treatment, try to avoid blood pressure being taken or needle sticks from occurring on the arm on the side of cancer.   This recommendation begins after surgery and continues for the rest of your life.  This may help reduce your risk of getting lymphedema (swelling in your arm). An excellent resource for those seeking information on lymphedema is the National Lymphedema Network's web site. It can be accessed at www.lymphnet.org If  you notice swelling in your hand, arm or breast at any time following surgery (even if it is many years from now), please contact your doctor or physical therapist to discuss this.  Lymphedema can be treated at any time but it is easier for you if it is treated early on.  If you feel like your shoulder motion is not returning to normal in a reasonable amount of time, please contact your surgeon or physical therapist.  [facility] [phone]. [address], [place] [address]  ABC CLASS After Breast Cancer Class  After Breast Cancer Class is a specially designed exercise class video to assist you in a safe recover after having breast cancer surgery.  In this video you will learn how to get back to full  function whether your drains were just removed or if you had surgery a month ago. The video can be viewed on this page: https://www.boyd-meyer.org/ or on YouTube here: https://[email or web address].  Class Goals  Understand specific stretches to improve the flexibility of you chest and shoulder. Learn ways to safely strengthen your upper body and improve your posture. Understand the warning signs of infection and why you may be at risk for an arm infection. Learn about Lymphedema and prevention.  ** You do not need to view this video until after surgery.  Drains should be removed to participate in the recommended exercises on the video.  Patient was instructed today in a home exercise program today for post op shoulder range of motion. These included active assist shoulder flexion in sitting, scapular retraction, wall walking with shoulder abduction, and hands behind head external rotation.  She was encouraged to do these twice a day, holding 3 seconds and repeating 5 times when permitted by her physician.  Bethann Punches, PT 07/31/23 11:00 AM

## 2023-07-31 NOTE — Progress Notes (Signed)
 REFERRING PROVIDER: Rachel Moulds, MD [address] [place],  [place] [address]  PRIMARY PROVIDER:  Tracey Harries, MD  PRIMARY REASON FOR VISIT:  1. 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 Family history of colon cancer   3. Family history of prostate cancer     HISTORY OF PRESENT ILLNESS:   Ms. Augenstein, a 78 y.o. female, was seen for a [place] cancer genetics consultation at the request of Dr. Al Pimple  due to a personal and family history of cancer.  Ms. Verno presents to clinic today to discuss the possibility of a hereditary predisposition to cancer, to discuss genetic testing, and to further clarify her future cancer risks, as well as potential cancer risks for family members.   In March 2025, Ms. Kehoe had an axillary lymph node biopsy that showed poorly differentiated carcinoma, likely a breast primary with focal GATA3 staining (ER-/PR-/HER2-).  The treatment plan is pending. 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, estrogen receptor negative (HCC)   07/31/2023 Cancer Staging   Staging form: Breast, AJCC 8th Edition - Clinical stage from 07/31/2023: Stage Unknown (cTX, cN1, cM0, ER-, PR-, HER2-) - Signed by Rachel Moulds, MD on 07/31/2023 Stage prefix: Initial diagnosis Laterality: Right Staged by:  Pathologist and managing physician Stage used in treatment planning: Yes National guidelines used in treatment planning: Yes Type of national guideline used in treatment planning: NCCN     Past Medical History:  Diagnosis Date   Anxiety    Anxiety    Arthritis    Cancer (HCC) 07/2020   SCC chest   Depression    Fall from horse 01/27/12   CT chest/Abd/Pelvis done at [facility]. Findings: Nondisplaced fx of posterior right 11th rib   GERD (gastroesophageal reflux disease)    Hypertension    Seizures (HCC)    been through testing, no issues with seizures or epilepsy   Syncopal episodes    seizure with episode   White matter disease     Past Surgical History:  Procedure Laterality Date   BREAST LUMPECTOMY WITH RADIOACTIVE SEED LOCALIZATION Left 06/21/2020   Procedure: LEFT BREAST LUMPECTOMY WITH RADIOACTIVE SEED LOCALIZATION;  Surgeon: Manus Rudd, MD;  Location: [facility];  Service: General;  Laterality: Left;   BUNIONECTOMY Bilateral 1978   COLONOSCOPY     EYE SURGERY     MELANOMA EXCISION Right 08/02/2020   Procedure: 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;  Surgeon: Manus Rudd, MD;  Location: [facility];  Service: General;  Laterality: Right;  ROOM 8 STARTING AT 04:15PM FOR 45 MIN    FAMILY HISTORY:  We obtained a detailed, 4-generation family history.  Significant diagnoses are listed below: Family History  Problem Relation Age of Onset   Colon cancer Mother 20  d. 51   Prostate cancer Father 13   Lung cancer Maternal Uncle        dx >50   Cancer Paternal Aunt        unknown type; mets; d. >50    Ms. Mcphearson is unaware of previous family history of genetic testing for hereditary cancer risks.  There is no reported Ashkenazi Jewish ancestry. There is no known consanguinity.  GENETIC COUNSELING ASSESSMENT: Ms. Manke is a 78 y.o. female with a personal history which is somewhat suggestive of a hereditary cancer  syndrome and predisposition to cancer given her history of an axillary node biopsy consistent with triple negative breast cancer. We, therefore, discussed and recommended the following at today's visit.   DISCUSSION: We discussed that 5 - 10% of cancer is hereditary.  Most cases of hereditary breast cancer are associated with mutations in BRCA1/2.  There are other genes that can be associated with hereditary breast cancer syndromes.  We discussed that testing is beneficial for several reasons including knowing how to follow individuals for their cancer risks, identifying whether potential treatment options, like PARP inhibitors, would be beneficial, and understanding if other family members could be at an increased risk for cancer and allowing them to undergo genetic testing.   We reviewed the characteristics, features and inheritance patterns of hereditary cancer syndromes. We also discussed genetic testing, including the appropriate family members to test, the process of testing, insurance coverage and turn-around-time for results. We discussed the implications of a negative, positive, carrier and/or variant of uncertain significant result. We recommended Ms. Zhang pursue genetic testing for a panel that includes genes associated with breast, colon, prostate and other cancers.   The CancerNext-Expanded gene panel offered by [facility] and includes sequencing, rearrangement, and RNA analysis for the following 76 genes: AIP, ALK, APC, ATM, AXIN2, BAP1, BARD1, BMPR1A, BRCA1, BRCA2, BRIP1, CDC73, CDH1, CDK4, CDKN1B, CDKN2A, CEBPA, CHEK2, CTNNA1, DDX41, DICER1, ETV6, FH, FLCN, GATA2, LZTR1, MAX, MBD4, MEN1, MET, MLH1, MSH2, MSH3, MSH6, MUTYH, NF1, NF2, NTHL1, PALB2, PHOX2B, PMS2, POT1, PRKAR1A, PTCH1, PTEN, RAD51C, RAD51D, RB1, RET, RUNX1, SDHA, SDHAF2, SDHB, SDHC, SDHD, SMAD4, SMARCA4, SMARCB1, SMARCE1, STK11, SUFU, TMEM127, TP53, TSC1, TSC2, VHL, and WT1 (sequencing and deletion/duplication); EGFR, HOXB13,  KIT, MITF, PDGFRA, POLD1, and POLE (sequencing only); EPCAM and GREM1 (deletion/duplication only).    Based on Ms. Finelli's personal history of an axillary node biopsy consistent with triple negative breast cancer, she meets NCCN medical criteria for genetic testing. We discussed that if her out of pocket cost for testing is over $100, the laboratory should contact her and discuss the self-pay prices and/or patient pay assistance programs.    PLAN: After considering the risks, benefits, and limitations, Ms. Baldonado provided informed consent to pursue genetic testing and the blood sample was sent to [facility] for analysis of the CancerNext-Expanded +RNAinsight Panel. Results should be available within approximately 3 weeks, at which point they will be disclosed by telephone to Ms. Coriz, as will any additional recommendations warranted by these results. Ms. Ballew will receive a summary of her genetic counseling visit and a copy of her results once available. This information will also be available in Epic.   Ms. Ekstrand questions were answered to her satisfaction today. Our contact information was provided should additional questions or concerns arise. Thank you for the referral and allowing [place] to share in the care of your patient.   Maicie Vanderloop M. Rennie Plowman, MS, [facility] Genetic Counselor [email or web address] (P) [phone]   30 minutes  were spent on the date of the encounter in service to the patient including preparation, face-to-face consultation, documentation and care coordination.  The patient was accompanied by her friend.  Dr. Al Pimple was available to discuss this case as needed.    _______________________________________________________________________ For Office Staff:  Number of people involved in session: 2 Was an Intern/ student involved with case: no

## 2023-08-04 ENCOUNTER — Other Ambulatory Visit

## 2023-08-05 ENCOUNTER — Encounter (HOSPITAL_COMMUNITY)
Admission: RE | Admit: 2023-08-05 | Discharge: 2023-08-05 | Disposition: A | Discharge status: Home / Self Care | Source: Ambulatory Visit | Attending: Hematology and Oncology | Admitting: Hematology and Oncology

## 2023-08-05 DIAGNOSIS — C50611 Malignant neoplasm of axillary tail of right female breast: Secondary | ICD-10-CM | POA: Insufficient documentation

## 2023-08-05 DIAGNOSIS — Z171 Estrogen receptor negative status [ER-]: Secondary | ICD-10-CM | POA: Insufficient documentation

## 2023-08-05 LAB — GLUCOSE, CAPILLARY: Glucose-Capillary: 89 mg/dL (ref 70–99)

## 2023-08-05 MED ORDER — FLUDEOXYGLUCOSE F - 18 (FDG) INJECTION
8.6100 | Freq: Once | INTRAVENOUS | Status: AC
  Administered 2023-08-05: 8.61 via INTRAVENOUS

## 2023-08-08 ENCOUNTER — Encounter: Payer: Self-pay | Admitting: *Deleted

## 2023-08-08 ENCOUNTER — Ambulatory Visit
Admission: RE | Admit: 2023-08-08 | Discharge: 2023-08-08 | Discharge status: Home / Self Care | Source: Ambulatory Visit | Attending: Diagnostic Radiology

## 2023-08-08 ENCOUNTER — Telehealth: Payer: Self-pay | Admitting: *Deleted

## 2023-08-08 DIAGNOSIS — C50912 Malignant neoplasm of unspecified site of left female breast: Secondary | ICD-10-CM

## 2023-08-08 MED ORDER — GADOPICLENOL 0.5 MMOL/ML IV SOLN
8.0000 mL | Freq: Once | INTRAVENOUS | Status: AC | PRN
  Administered 2023-08-08: 8 mL via INTRAVENOUS

## 2023-08-08 NOTE — Telephone Encounter (Signed)
 Left message to follow up from [facility] 3/19 and assess navigation needs.

## 2023-08-09 ENCOUNTER — Telehealth: Payer: Self-pay | Admitting: *Deleted

## 2023-08-09 ENCOUNTER — Encounter: Payer: Self-pay | Admitting: *Deleted

## 2023-08-09 NOTE — Telephone Encounter (Signed)
 Called reading room x2 to have PET scans read STAT due to patient having appt 3/31 with Dr. Luisa Hart to discuss results.

## 2023-08-12 ENCOUNTER — Ambulatory Visit: Payer: Self-pay | Admitting: Surgery

## 2023-08-12 DIAGNOSIS — C50911 Malignant neoplasm of unspecified site of right female breast: Secondary | ICD-10-CM

## 2023-08-13 ENCOUNTER — Encounter: Payer: Self-pay | Admitting: *Deleted

## 2023-08-13 ENCOUNTER — Encounter: Payer: Self-pay | Admitting: Family Medicine

## 2023-08-21 ENCOUNTER — Ambulatory Visit: Payer: Self-pay | Admitting: Genetic Counselor

## 2023-08-21 ENCOUNTER — Encounter: Payer: Self-pay | Admitting: Genetic Counselor

## 2023-08-21 ENCOUNTER — Telehealth: Payer: Self-pay | Admitting: Genetic Counselor

## 2023-08-21 DIAGNOSIS — Z171 Estrogen receptor negative status [ER-]: Secondary | ICD-10-CM

## 2023-08-21 DIAGNOSIS — Z1379 Encounter for other screening for genetic and chromosomal anomalies: Secondary | ICD-10-CM

## 2023-08-21 NOTE — Telephone Encounter (Signed)
 Contacted patient in attempt to disclose results of genetic testing.  LVM with contact information requesting a call back.

## 2023-08-21 NOTE — Telephone Encounter (Signed)
 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  No known increased cancer risks based on this result alone/no management changes at this time.  Detailed clinic note to follow

## 2023-08-22 ENCOUNTER — Telehealth: Payer: Self-pay | Admitting: Hematology and Oncology

## 2023-08-22 NOTE — Telephone Encounter (Signed)
 Patient scheduled appointments. Patient is aware of all appointment details.

## 2023-08-27 ENCOUNTER — Encounter (HOSPITAL_BASED_OUTPATIENT_CLINIC_OR_DEPARTMENT_OTHER): Payer: Self-pay | Admitting: Surgery

## 2023-08-28 NOTE — Progress Notes (Signed)
 HPI: Ms. Heuer was previously seen in the [facility] due to a family of cancer and concerns regarding a hereditary predisposition to cancer.   Please refer to our prior cancer genetics clinic note for more information regarding Ms. Hendrie's medical, social and family histories, and our assessment and recommendations, at the time. Ms. Renda recent genetic test results were disclosed to her by telephone.     FAMILY HISTORY:  We obtained a detailed, 4-generation family history.  Significant diagnoses are listed below:      Family History  Problem Relation Age of Onset   Colon cancer Mother 69        d. 63   Prostate cancer Father 79   Lung cancer Maternal Uncle          dx >50   Cancer Paternal Aunt          unknown type; mets; d. >50     Ms. Bronder is unaware of previous family history of genetic testing for hereditary cancer risks.  There is no reported Ashkenazi Jewish ancestry. There is no known consanguinity.  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 Since Ms. Highley has only one pathogenic mutation in MUTYH, she is NOT affected with MYH-associated polyposis, but instead is a carrier.    No other pathogenic variants were identified in the [facility] CancerNext-Expanded +RNAinsight Panel.    The CancerNext-Expanded gene panel offered by [facility] and includes sequencing, rearrangement, and RNA analysis for the following 76 genes: AIP, ALK, APC, ATM, AXIN2, BAP1, BARD1, BMPR1A, BRCA1, BRCA2, BRIP1, CDC73, CDH1, CDK4, CDKN1B, CDKN2A, CEBPA, CHEK2, CTNNA1, DDX41, DICER1, ETV6, FH, FLCN, GATA2, LZTR1, MAX, MBD4, MEN1, MET, MLH1, MSH2, MSH3, MSH6, MUTYH, NF1, NF2, NTHL1, PALB2, PHOX2B, PMS2, POT1, PRKAR1A, PTCH1, PTEN, RAD51C, RAD51D, RB1, RET, RUNX1, SDHA, SDHAF2, SDHB, SDHC, SDHD, SMAD4, SMARCA4, SMARCB1, SMARCE1, STK11, SUFU, TMEM127, TP53, TSC1, TSC2, VHL, and WT1 (sequencing and  deletion/duplication); EGFR, HOXB13, KIT, MITF, PDGFRA, POLD1, and POLE (sequencing only); EPCAM and GREM1 (deletion/duplication only).      A copy of the test report has been scanned into Epic and is located under the Molecular Pathology section of the Results Review tab.  Report date is 08/12/2023.     Of note, this result does not explain the personal or family history of cancer.  Even though a pathogenic variant related her history of breast cancer was not identified, possible explanations for the cancer in the family may include: The cancers in Ms. Sporrer and/or her family may not be hereditary but rather sporadic/familial or due to other genetic and environmental factors.  Most cancer is not hereditary.  There may be a gene mutation in one of these genes that current testing methods cannot detect but that chance is small. There could be another gene that has not yet been discovered, or that we have not yet tested, that is responsible for the cancer diagnoses in the family.  It is also possible there is a hereditary cause for the cancer in the family that Ms. Forney did not inherit.   Therefore, it is important to remain in touch with cancer genetics in the future so that we can continue to offer Ms. Grondin the most up to date genetic testing.      MUTYH HETEROZYGOUS RECOMMENDATIONS:  MAP (MUTYH-Associated Polyposis) occurs in individuals with two pathogenic variants in MUTYH--one in each copy of their MUTYH genes. Individuals with a single (heterozygous) MUTYH  pathogenic variant are not affected with MAP but instead are carriers.  About 1-2% of individuals have one pathogenic mutation in MUTYH.    Individuals with one MUTYH mutation (MUTYH carriers) are not known to be at an increased risk for colon cancer or polyps.  Additionally, there is insufficient evidence that they are at higher risk for other cancers over the general population.   No changes to cancer screening or management are  recommended based on this result.  General population screening is appropriate for individuals with single pathogenic variants in MUTYH.  They should be managed based on personal history and first-degree family history of colon cancer.    This information is based on current understanding of the gene and may change in the future.   FAMILY MEMBERS:  Hereditary predisposition to cancer due to pathogenic variants in the MUTYH gene has autosomal recessive inheritance. This means that, while an individual with a pathogenic variant has a 50% chance of passing the MUTYH mutation on to his/her offspring, they will not be affected unless they inherit a second mutation from their other parent.   Family members can consider genetic testing for this familial pathogenic variant. As there are no cancer risks associated with a single pathogenic variant in the MUTYH gene, individuals in the family are not recommended to have testing until they reach at least 79 years of age. They may contact our office at [phone] for more information or to schedule an appointment.  Complimentary testing through [facility] for the familial variant is available for 90 days after the report date.  Family members who live outside of the area are encouraged to find a genetic counselor in their area by visiting: [email or web address].  We encourage Ms. Abee to remain in contact with us  in cancer genetics on an annual basis so we can update Ms. Brisbon's personal and family histories, and inform her of advances in cancer genetics that may be of benefit for the entire family. Ms. Villela knows she is also welcome to call with any questions or concerns, at any time.    Gurnoor Sloop M. Ora Billing, MS, [facility] Genetic Counselor [email or web address] (P) [phone]

## 2023-09-02 ENCOUNTER — Encounter (HOSPITAL_BASED_OUTPATIENT_CLINIC_OR_DEPARTMENT_OTHER)
Admission: RE | Admit: 2023-09-02 | Discharge: 2023-09-02 | Disposition: A | Discharge status: Home / Self Care | Source: Ambulatory Visit | Attending: Surgery | Admitting: Surgery

## 2023-09-02 DIAGNOSIS — Z01812 Encounter for preprocedural laboratory examination: Secondary | ICD-10-CM | POA: Insufficient documentation

## 2023-09-02 LAB — BASIC METABOLIC PANEL WITH GFR
Anion gap: 11 (ref 5–15)
BUN: 22 mg/dL (ref 8–23)
CO2: 25 mmol/L (ref 22–32)
Calcium: 9 mg/dL (ref 8.9–10.3)
Chloride: 103 mmol/L (ref 98–111)
Creatinine, Ser: 0.83 mg/dL (ref 0.44–1.00)
GFR, Estimated: >60 mL/min (ref >60)
Glucose, Bld: 92 mg/dL (ref 70–99)
Potassium: 4 mmol/L (ref 3.5–5.1)
Sodium: 139 mmol/L (ref 135–145)

## 2023-09-02 MED ORDER — CHLORHEXIDINE GLUCONATE CLOTH 2 % EX PADS: 6.0000 | MEDICATED_PAD | Freq: Once | CUTANEOUS | Status: DC

## 2023-09-02 NOTE — Progress Notes (Signed)

## 2023-09-03 NOTE — H&P (Signed)
 D S  Social History Sex and Gender Information Value Date Recorded  Sex Assigned at Birth Female 07/31/2023 9:06 PM EDT  Legal Sex Female 10/06/2020 9:26 PM EDT  Gender Identity Female 07/31/2023 9:06 PM EDT  Sexual Orientation Not on file     Last Filed Vital Signs - documented in this encounter  Last Filed Vital Signs Vital Sign Reading Time Taken Comments  Blood Pressure 140/95 08/12/2023 1:28 PM EDT    Pulse 82 08/12/2023 1:28 PM EDT    Temperature 36.8 C (98.2 F) 08/12/2023 1:28 PM EDT    Respiratory Rate - -    Oxygen Saturation 97% 08/12/2023 1:28 PM EDT    Inhaled Oxygen Concentration - -    Weight 77.8 kg (171 lb 9.6 oz) 08/12/2023 1:28 PM EDT    Height 160 cm (5\' 3" ) 08/12/2023 1:28 PM EDT    Body Mass Index 30.4 08/12/2023 1:28 PM EDT      M Chief Complaint: RE-CHECK   History of Present Illness: Marcia Harvey is a 78 y.o. female who is seen today for follow-up of right breast mass. 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 PET scan showed no primary this is believed to be a solitary finding. She presents for evaluation and workup..    Review of Systems: A complete review of systems was obtained from the patient. I have reviewed this information and discussed as appropriate with the patient. See HPI as well for other ROS.    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 during CT Shellfish Containing Products Hives Shrimp Adhesive Rash Irritating red rash  Current Outpatient Medications on File Prior to Visit Medication Sig Dispense Refill atorvastatin (LIPITOR) 40 MG tablet Take 40 mg by mouth once daily b complex vitamins tablet Take 1 tablet by mouth once daily lisinopriL -hydroCHLOROthiazide  (ZESTORETIC ) 20-25 mg tablet Take 1 tablet by mouth once daily omeprazole (PRILOSEC) 40 MG DR capsule Take 40 mg by mouth once daily sertraline (ZOLOFT) 25 MG tablet Take 25 mg by mouth once daily traZODone (DESYREL) 50 MG tablet Take 0.5-1 tablets by mouth at bedtime as needed for Sleep ascorbic acid, vitamin C, (VITAMIN C) 500 MG tablet Take 500 mg by mouth once daily (Patient not taking: Reported on 08/12/2023) aspirin 81 MG EC tablet Take 81 mg by mouth every other day (Patient not taking: Reported on 08/12/2023) cholecalciferol (VITAMIN D3) 2,000 unit tablet Take 2,000 Units by mouth once daily (Patient not taking: Reported on 08/12/2023)  No current facility-administered medications on file prior to visit.  History reviewed. No pertinent family history.  Social History  Tobacco Use Smoking Status Former Types: Cigarettes Smokeless Tobacco Never Tobacco Comments Quit smoking cigarettes 05/21/2003   Social History  Socioeconomic History Marital status: Single Tobacco Use Smoking status: Former Types: Cigarettes Smokeless tobacco: Never Tobacco comments: Quit smoking cigarettes 05/21/2003 Vaping Use Vaping status: Never Used Substance and  Sexual Activity Alcohol use: Yes Drug use: Never  Social Drivers of Catering manager Strain: Low Risk (07/22/2023) Received from [facility] Financial Resource Strain (CARDIA) Difficulty of Paying Living Expenses: Not very hard Food Insecurity: No Food Insecurity (07/31/2023) Received from [facility] Hunger Vital Sign Worried About Running Out of Food in the Last Year: Never true Ran Out of Food in the Last Year: Never true Transportation Needs: No Transportation Needs (07/31/2023) Received from [facility] - Transportation Lack of Transportation (Medical): No Lack of Transportation (Non-Medical): No Physical Activity: Sufficiently Active (07/22/2023) Received from [facility] Exercise Vital Sign Days of Exercise per Week: 5 days Minutes of Exercise per Session: 60 min Stress: Stress Concern Present (07/22/2023) Received from [facility] Occupational Health - Occupational Stress Questionnaire Feeling of Stress : To some extent Social Connections: Socially Integrated (07/22/2023) Received from [facility] Social Network How would you rate your social network (family, work, friends)?: Good participation with social networks Housing Stability: Unknown (08/12/2023) Housing Stability Vital Sign Homeless in the Last Year: No  Objective:  Vitals: 08/12/23 1328 08/12/23 1329 BP: (!) 140/95 Pulse: 82 Temp: 36.8 C (98.2 F) SpO2: 97% Weight: 77.8 kg (171 lb 9.6 oz) Height: 160 cm (5\' 3" ) PainSc: 0-No pain PainLoc: Breast  Body mass index is 30.4 kg/m.  Physical Exam Exam conducted with a chaperone present. Neurological: General: No focal deficit present. Mental Status: She is alert. Psychiatric: Mood and Affect: Mood normal.    Labs, Imaging and Diagnostic Testing:  CLINICAL DATA: 78 year old female with recently diagnosed poorly differentiated carcinoma within the RIGHT axilla. History of LEFT breast surgery for epithelial atypia/complex sclerosing lesion.  EXAM: BILATERAL BREAST MRI WITH AND WITHOUT CONTRAST  TECHNIQUE: Multiplanar, multisequence MR  images of both breasts were obtained prior to and following the intravenous administration of 8 ml of Vueway   Three-dimensional MR images were rendered by post-processing of the original MR data on an independent workstation. The three-dimensional MR images were interpreted, and findings are reported in the following complete MRI report for this study. Three dimensional images were evaluated at the independent interpreting workstation using the DynaCAD thin client.  COMPARISON: Prior outside mammograms.  FINDINGS: Breast composition: b. Scattered fibroglandular tissue.  Background parenchymal enhancement: Minimal  Right breast: No suspicious mass or worrisome enhancement is identified within the RIGHT breast. Biopsy clip artifact within the LOWER RIGHT breast identified. Normal intraparenchymal lymph nodes within the posterior OUTER RIGHT breast noted.  Left breast: No suspicious mass or worrisome enhancement identified. A lobulated mass within the UPPER OUTER LEFT breast is unchanged mammographically from 06/16/2021. LEFT breast surgical changes again noted.  Lymph nodes: A 2.5 x 4 cm rim enhancing mass in the RIGHT axilla is identified (image 13: Series 12), compatible with biopsy-proven malignancy.  No other abnormal lymph nodes are noted.  Ancillary findings: None.  IMPRESSION: 1. 4 cm biopsy-proven malignancy within the RIGHT axilla, without MR evidence of RIGHT breast malignancy. This may represent a metastasis to a lymph node without evidence of breast primary, or could represent a primary breast malignancy in accessory breast tissue (if this represents a breast cancer). 2. No evidence of LEFT breast malignancy. No other abnormal appearing lymph nodes.  RECOMMENDATION: Treatment plan  BI-RADS CATEGORY 6: Known biopsy-proven malignancy.   Electronically Signed By: Sundra Engel M.D. On: 08/08/2023 13:25  CLINICAL DATA: Initial treatment strategy for lymph  node positive breast cancer. Evaluate primary site. Recently diagnosed with poorly differentiated carcinoma  within the right axilla. History of left breast surgery for epithelial atypia/complex sclerosing lesion  EXAM: NUCLEAR MEDICINE PET SKULL BASE TO THIGH  TECHNIQUE: 8.6 mCi F-18 FDG was injected intravenously. Full-ring PET imaging was performed from the skull base to thigh after the radiotracer. CT data was obtained and used for attenuation correction and anatomic localization.  Fasting blood glucose: 89 mg/dl  COMPARISON: Breast MR 08/08/2023. 01/27/2012 chest abdomen and pelvic CTs, report only.  FINDINGS: Mediastinal blood pool activity: SUV max 2.0  Liver activity: SUV max NA  NECK: No areas of abnormal hypermetabolism.  Incidental CT findings: Surgical changes in both globes. Bilateral carotid atherosclerosis. No cervical adenopathy.  CHEST: The right axillary mass is peripherally hypervascular and centrally photopenic, presumably representing necrosis. It measures 3.5 x 2.7 cm and a S.U.V. max of 7.8 by 55/4. No breast, mediastinal/hilar, or pulmonary parenchymal hypermetabolism identified.  Incidental CT findings: Aortic atherosclerosis. Lad and left circumflex coronary artery calcification. No left axillary adenopathy.  ABDOMEN/PELVIS: No abdominopelvic parenchymal or nodal hypermetabolism.  Incidental CT findings: Normal adrenal glands. Abdominal aortic atherosclerosis. Scattered colonic diverticula.  SKELETON: No abnormal marrow activity.  Incidental CT findings: None.  IMPRESSION: 1. Isolated right axillary hypermetabolic nodal metastasis or primary within accessory breast tissue, as on MRI. 2. No distant primary site or evidence of hypermetabolic metastasis. 3. Incidental findings, including: Coronary artery atherosclerosis. Aortic Atherosclerosis (ICD10-I70.0).   Electronically Signed By: Lore Rode M.D. 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 Risks and benefits reviewed. Potential treatment options and side effects of surgery reviewed today with the patient. Risk of lymphedema reviewed. Risk of bleeding, infection, and the need for other potential treatments reviewed.The procedure has been discussed with the patient. Alternatives to surgery have been discussed with the patient. Risks of surgery include bleeding, Infection, Seroma formation, death, and the need for further surgery. The patient understands and wishes to proceed.  No follow-ups on file.  Tniyah Nakagawa Alvy Journey, MD      P

## 2023-09-04 ENCOUNTER — Encounter (HOSPITAL_BASED_OUTPATIENT_CLINIC_OR_DEPARTMENT_OTHER)
Admission: RE | Disposition: A | Discharge status: Home / Self Care | Payer: Self-pay | Source: Home / Self Care | Attending: Surgery

## 2023-09-04 ENCOUNTER — Ambulatory Visit (HOSPITAL_BASED_OUTPATIENT_CLINIC_OR_DEPARTMENT_OTHER): Admitting: Anesthesiology

## 2023-09-04 ENCOUNTER — Ambulatory Visit (HOSPITAL_BASED_OUTPATIENT_CLINIC_OR_DEPARTMENT_OTHER)
Admission: RE | Admit: 2023-09-04 | Discharge: 2023-09-04 | Disposition: A | Discharge status: Home / Self Care | Attending: Surgery | Admitting: Surgery

## 2023-09-04 ENCOUNTER — Other Ambulatory Visit: Payer: Self-pay

## 2023-09-04 ENCOUNTER — Encounter (HOSPITAL_BASED_OUTPATIENT_CLINIC_OR_DEPARTMENT_OTHER): Payer: Self-pay | Admitting: Surgery

## 2023-09-04 DIAGNOSIS — Z171 Estrogen receptor negative status [ER-]: Secondary | ICD-10-CM | POA: Insufficient documentation

## 2023-09-04 DIAGNOSIS — I10 Essential (primary) hypertension: Secondary | ICD-10-CM | POA: Diagnosis not present

## 2023-09-04 DIAGNOSIS — C50911 Malignant neoplasm of unspecified site of right female breast: Secondary | ICD-10-CM | POA: Diagnosis not present

## 2023-09-04 DIAGNOSIS — R590 Localized enlarged lymph nodes: Secondary | ICD-10-CM

## 2023-09-04 DIAGNOSIS — C773 Secondary and unspecified malignant neoplasm of axilla and upper limb lymph nodes: Secondary | ICD-10-CM | POA: Insufficient documentation

## 2023-09-04 DIAGNOSIS — Z01818 Encounter for other preprocedural examination: Secondary | ICD-10-CM

## 2023-09-04 DIAGNOSIS — Z87891 Personal history of nicotine dependence: Secondary | ICD-10-CM | POA: Insufficient documentation

## 2023-09-04 DIAGNOSIS — K219 Gastro-esophageal reflux disease without esophagitis: Secondary | ICD-10-CM | POA: Diagnosis not present

## 2023-09-04 HISTORY — PX: AXILLARY LYMPH NODE DISSECTION: SHX5229

## 2023-09-04 HISTORY — PX: SENTINEL NODE BIOPSY: SHX6608

## 2023-09-04 SURGERY — BIOPSY, LYMPH NODE, SENTINEL
Anesthesia: General | Laterality: Right

## 2023-09-04 MED ORDER — CEFAZOLIN SODIUM-DEXTROSE 2-4 GM/100ML-% IV SOLN
INTRAVENOUS | Status: AC
  Filled 2023-09-04: qty 100

## 2023-09-04 MED ORDER — LACTATED RINGERS IV SOLN: INTRAVENOUS | Status: DC

## 2023-09-04 MED ORDER — OXYCODONE HCL 5 MG/5ML PO SOLN: 5.0000 mg | Freq: Once | ORAL | Status: AC | PRN

## 2023-09-04 MED ORDER — ACETAMINOPHEN 500 MG PO TABS
ORAL_TABLET | ORAL | Status: AC
Start: 2023-09-04 — End: ?
  Filled 2023-09-04: qty 2

## 2023-09-04 MED ORDER — ONDANSETRON HCL 4 MG/2ML IJ SOLN
INTRAMUSCULAR | Status: AC
  Filled 2023-09-04: qty 2

## 2023-09-04 MED ORDER — OXYCODONE HCL 5 MG PO TABS
ORAL_TABLET | ORAL | Status: AC
  Filled 2023-09-04: qty 1

## 2023-09-04 MED ORDER — PROPOFOL 10 MG/ML IV BOLUS
INTRAVENOUS | Status: AC
  Filled 2023-09-04: qty 20

## 2023-09-04 MED ORDER — FENTANYL CITRATE (PF) 100 MCG/2ML IJ SOLN
50.0000 ug | Freq: Once | INTRAMUSCULAR | Status: AC
  Administered 2023-09-04: 50 ug via INTRAVENOUS

## 2023-09-04 MED ORDER — BUPIVACAINE-EPINEPHRINE (PF) 0.25% -1:200000 IJ SOLN
INTRAMUSCULAR | Status: AC
  Filled 2023-09-04: qty 90

## 2023-09-04 MED ORDER — ATROPINE SULFATE 0.4 MG/ML IV SOLN
INTRAVENOUS | Status: AC
  Filled 2023-09-04: qty 1

## 2023-09-04 MED ORDER — FENTANYL CITRATE (PF) 100 MCG/2ML IJ SOLN
INTRAMUSCULAR | Status: DC | PRN
Start: 2023-09-04 — End: 2023-09-04
  Administered 2023-09-04 (×4): 25 ug via INTRAVENOUS

## 2023-09-04 MED ORDER — PHENYLEPHRINE 80 MCG/ML (10ML) SYRINGE FOR IV PUSH (FOR BLOOD PRESSURE SUPPORT)
PREFILLED_SYRINGE | INTRAVENOUS | Status: AC
  Filled 2023-09-04: qty 10

## 2023-09-04 MED ORDER — MIDAZOLAM HCL 2 MG/2ML IJ SOLN
INTRAMUSCULAR | Status: AC
  Filled 2023-09-04: qty 2

## 2023-09-04 MED ORDER — CLONIDINE HCL (ANALGESIA) 100 MCG/ML EP SOLN
EPIDURAL | Status: DC | PRN
  Administered 2023-09-04: 50 ug

## 2023-09-04 MED ORDER — LIDOCAINE 2% (20 MG/ML) 5 ML SYRINGE
INTRAMUSCULAR | Status: AC
  Filled 2023-09-04: qty 5

## 2023-09-04 MED ORDER — MAGTRACE LYMPHATIC TRACER
INTRAMUSCULAR | Status: DC | PRN
  Administered 2023-09-04: 2 mL via INTRAMUSCULAR

## 2023-09-04 MED ORDER — EPHEDRINE 5 MG/ML INJ
INTRAVENOUS | Status: AC
  Filled 2023-09-04: qty 5

## 2023-09-04 MED ORDER — BUPIVACAINE-EPINEPHRINE 0.25% -1:200000 IJ SOLN
INTRAMUSCULAR | Status: DC | PRN
  Administered 2023-09-04: 9 mL

## 2023-09-04 MED ORDER — LIDOCAINE HCL (CARDIAC) PF 100 MG/5ML IV SOSY
PREFILLED_SYRINGE | INTRAVENOUS | Status: DC | PRN
  Administered 2023-09-04: 20 mg via INTRAVENOUS

## 2023-09-04 MED ORDER — PHENYLEPHRINE HCL (PRESSORS) 10 MG/ML IV SOLN
INTRAVENOUS | Status: DC | PRN
  Administered 2023-09-04 (×2): 80 ug via INTRAVENOUS

## 2023-09-04 MED ORDER — ACETAMINOPHEN 500 MG PO TABS
1000.0000 mg | ORAL_TABLET | ORAL | Status: AC
  Administered 2023-09-04: 1000 mg via ORAL

## 2023-09-04 MED ORDER — DEXAMETHASONE SODIUM PHOSPHATE 10 MG/ML IJ SOLN
INTRAMUSCULAR | Status: DC | PRN
  Administered 2023-09-04: 10 mg via INTRAVENOUS

## 2023-09-04 MED ORDER — OXYCODONE HCL 5 MG PO TABS
5.0000 mg | ORAL_TABLET | Freq: Once | ORAL | Status: AC | PRN
  Administered 2023-09-04: 5 mg via ORAL

## 2023-09-04 MED ORDER — DEXAMETHASONE SODIUM PHOSPHATE 10 MG/ML IJ SOLN
INTRAMUSCULAR | Status: AC
  Filled 2023-09-04: qty 1

## 2023-09-04 MED ORDER — EPHEDRINE SULFATE (PRESSORS) 50 MG/ML IJ SOLN
INTRAMUSCULAR | Status: DC | PRN
  Administered 2023-09-04 (×2): 5 mg via INTRAVENOUS
  Administered 2023-09-04: 10 mg via INTRAVENOUS
  Administered 2023-09-04: 5 mg via INTRAVENOUS

## 2023-09-04 MED ORDER — AMISULPRIDE (ANTIEMETIC) 5 MG/2ML IV SOLN: 10.0000 mg | Freq: Once | INTRAVENOUS | Status: DC | PRN

## 2023-09-04 MED ORDER — ONDANSETRON HCL 4 MG/2ML IJ SOLN
INTRAMUSCULAR | Status: DC | PRN
  Administered 2023-09-04: 4 mg via INTRAVENOUS

## 2023-09-04 MED ORDER — OXYCODONE HCL 5 MG PO TABS: 5.0000 mg | ORAL_TABLET | Freq: Four times a day (QID) | ORAL | 0 refills | Status: DC | PRN

## 2023-09-04 MED ORDER — FENTANYL CITRATE (PF) 100 MCG/2ML IJ SOLN
INTRAMUSCULAR | Status: AC
  Filled 2023-09-04: qty 2

## 2023-09-04 MED ORDER — 0.9 % SODIUM CHLORIDE (POUR BTL) OPTIME
TOPICAL | Status: DC | PRN
  Administered 2023-09-04: 1000 mL

## 2023-09-04 MED ORDER — CEFAZOLIN SODIUM-DEXTROSE 2-4 GM/100ML-% IV SOLN
2.0000 g | INTRAVENOUS | Status: AC
Start: 2023-09-04 — End: 2023-09-04
  Administered 2023-09-04: 2 g via INTRAVENOUS

## 2023-09-04 MED ORDER — BUPIVACAINE-EPINEPHRINE (PF) 0.5% -1:200000 IJ SOLN
INTRAMUSCULAR | Status: DC | PRN
  Administered 2023-09-04: 30 mL

## 2023-09-04 MED ORDER — MIDAZOLAM HCL 2 MG/2ML IJ SOLN
0.5000 mg | Freq: Once | INTRAMUSCULAR | Status: AC
  Administered 2023-09-04: 0.5 mg via INTRAVENOUS

## 2023-09-04 MED ORDER — FENTANYL CITRATE (PF) 100 MCG/2ML IJ SOLN
25.0000 ug | INTRAMUSCULAR | Status: DC | PRN
  Administered 2023-09-04 (×2): 50 ug via INTRAVENOUS

## 2023-09-04 MED ORDER — PROPOFOL 10 MG/ML IV BOLUS
INTRAVENOUS | Status: DC | PRN
Start: 2023-09-04 — End: 2023-09-04
  Administered 2023-09-04: 50 mg via INTRAVENOUS
  Administered 2023-09-04: 150 mg via INTRAVENOUS

## 2023-09-04 SURGICAL SUPPLY — 46 items
BIOPATCH RED 1 DISK 7.0 (GAUZE/BANDAGES/DRESSINGS) ×1 IMPLANT
BLADE CLIPPER SURG (BLADE) IMPLANT
BLADE SURG 15 STRL LF DISP TIS (BLADE) ×1 IMPLANT
CANISTER SUCT 1200ML W/VALVE (MISCELLANEOUS) ×1 IMPLANT
CHLORAPREP W/TINT 26 (MISCELLANEOUS) ×1 IMPLANT
CLIP APPLIE 11 MED OPEN (CLIP) ×1 IMPLANT
CLIP APPLIE 9.375 MED OPEN (MISCELLANEOUS) IMPLANT
COVER BACK TABLE 60X90IN (DRAPES) ×1 IMPLANT
COVER MAYO STAND STRL (DRAPES) ×1 IMPLANT
COVER PROBE W GEL 5X96 (DRAPES) IMPLANT
DERMABOND ADVANCED .7 DNX12 (GAUZE/BANDAGES/DRESSINGS) ×1 IMPLANT
DRAIN CHANNEL 19F RND (DRAIN) ×1 IMPLANT
DRAPE LAPAROSCOPIC ABDOMINAL (DRAPES) ×1 IMPLANT
DRAPE UTILITY XL STRL (DRAPES) ×1 IMPLANT
ELECT COATED BLADE 2.86 ST (ELECTRODE) ×1 IMPLANT
ELECTRODE REM PT RTRN 9FT ADLT (ELECTROSURGICAL) ×1 IMPLANT
EVACUATOR SILICONE 100CC (DRAIN) ×1 IMPLANT
GAUZE SPONGE 4X4 12PLY STRL (GAUZE/BANDAGES/DRESSINGS) IMPLANT
GLOVE BIOGEL PI IND STRL 8 (GLOVE) ×1 IMPLANT
GLOVE ECLIPSE 8.0 STRL XLNG CF (GLOVE) ×1 IMPLANT
GOWN STRL REUS W/ TWL LRG LVL3 (GOWN DISPOSABLE) ×2 IMPLANT
GOWN STRL REUS W/ TWL XL LVL3 (GOWN DISPOSABLE) ×1 IMPLANT
HEMOSTAT ARISTA ABSORB 3G PWDR (HEMOSTASIS) IMPLANT
HEMOSTAT SURGICEL 2X14 (HEMOSTASIS) IMPLANT
HEMOSTAT SURGICEL 4X8 (HEMOSTASIS) IMPLANT
NDL HYPO 25X1 1.5 SAFETY (NEEDLE) IMPLANT
NEEDLE HYPO 25X1 1.5 SAFETY (NEEDLE) ×2 IMPLANT
NS IRRIG 1000ML POUR BTL (IV SOLUTION) ×1 IMPLANT
PACK BASIN DAY SURGERY FS (CUSTOM PROCEDURE TRAY) ×1 IMPLANT
PENCIL SMOKE EVACUATOR (MISCELLANEOUS) ×1 IMPLANT
PIN SAFETY STERILE (MISCELLANEOUS) ×1 IMPLANT
SLEEVE SCD COMPRESS KNEE MED (STOCKING) ×1 IMPLANT
SPIKE FLUID TRANSFER (MISCELLANEOUS) ×1 IMPLANT
SPONGE T-LAP 4X18 ~~LOC~~+RFID (SPONGE) ×1 IMPLANT
SUT ETHILON 3 0 PS 1 (SUTURE) ×1 IMPLANT
SUT MNCRL AB 4-0 PS2 18 (SUTURE) ×1 IMPLANT
SUT SILK 3 0 SH 30 (SUTURE) IMPLANT
SUT VIC AB 2-0 SH 27XBRD (SUTURE) IMPLANT
SUT VICRYL 3-0 CR8 SH (SUTURE) ×1 IMPLANT
SUT VICRYL AB 2 0 TIE (SUTURE) IMPLANT
SYR CONTROL 10ML LL (SYRINGE) ×1 IMPLANT
TOWEL GREEN STERILE FF (TOWEL DISPOSABLE) ×1 IMPLANT
TRACER MAGTRACE VIAL (MISCELLANEOUS) IMPLANT
TRAY FAXITRON CT DISP (TRAY / TRAY PROCEDURE) IMPLANT
TUBE CONNECTING 20X1/4 (TUBING) ×1 IMPLANT
YANKAUER SUCT BULB TIP NO VENT (SUCTIONS) ×1 IMPLANT

## 2023-09-04 NOTE — Anesthesia Preprocedure Evaluation (Signed)
 Anesthesia Evaluation  Patient identified by MRN, date of birth, ID band Patient awake    Reviewed: Allergy & Precautions, NPO status , Patient's Chart, lab work & pertinent test results  Airway Mallampati: II  TM Distance: >3 FB Neck ROM: Full    Dental  (+) Dental Advisory Given   Pulmonary former smoker   Pulmonary exam normal        Cardiovascular hypertension, Pt. on medications  Rhythm:Regular Rate:Normal     Neuro/Psych Seizures -,     GI/Hepatic Neg liver ROS,GERD  ,,  Endo/Other  negative endocrine ROS    Renal/GU negative Renal ROS     Musculoskeletal  (+) Arthritis ,    Abdominal   Peds  Hematology negative hematology ROS (+)   Anesthesia Other Findings   Reproductive/Obstetrics                             Anesthesia Physical Anesthesia Plan  ASA: 3  Anesthesia Plan: General   Post-op Pain Management: Regional block* and Tylenol  PO (pre-op)*   Induction: Intravenous  PONV Risk Score and Plan: 3 and Dexamethasone , Ondansetron , Treatment may vary due to age or medical condition and Propofol  infusion  Airway Management Planned: LMA  Additional Equipment: None  Intra-op Plan:   Post-operative Plan: Extubation in OR  Informed Consent: I have reviewed the patients History and Physical, chart, labs and discussed the procedure including the risks, benefits and alternatives for the proposed anesthesia with the patient or authorized representative who has indicated his/her understanding and acceptance.     Dental advisory given  Plan Discussed with: CRNA  Anesthesia Plan Comments:        Anesthesia Quick Evaluation

## 2023-09-04 NOTE — Transfer of Care (Signed)
 Immediate Anesthesia Transfer of Care Note  Patient: Marcia Harvey  Procedure(s) Performed: BIOPSY, LYMPH NODE, SENTINEL (Right) LYMPHADENECTOMY, AXILLARY (Right)  Patient Location: PACU  Anesthesia Type:GA combined with regional for post-op pain  Level of Consciousness: drowsy  Airway & Oxygen Therapy: Patient Spontanous Breathing and Patient connected to face mask oxygen  Post-op Assessment: Report given to RN and Post -op Vital signs reviewed and stable  Post vital signs: Reviewed and stable  Last Vitals:  Vitals Value Taken Time  BP 122/61 (73)   Temp    Pulse 76 09/04/23 1009  Resp 12   SpO2 98 % 09/04/23 1009  Vitals shown include unfiled device data.  Last Pain:  Vitals:   09/04/23 0655  TempSrc: Temporal  PainSc: 0-No pain      Patients Stated Pain Goal: 3 (09/04/23 0981)  Complications: No notable events documented.

## 2023-09-04 NOTE — Anesthesia Procedure Notes (Signed)
 Procedure Name: LMA Insertion Date/Time: 09/04/2023 8:37 AM  Performed by: Noralyn Beams, CRNAPre-anesthesia Checklist: Patient identified, Emergency Drugs available, Suction available and Patient being monitored Patient Re-evaluated:Patient Re-evaluated prior to induction Oxygen Delivery Method: Circle system utilized Preoxygenation: Pre-oxygenation with 100% oxygen Induction Type: IV induction Ventilation: Mask ventilation without difficulty LMA: LMA inserted LMA Size: 4.0 Number of attempts: 1 Airway Equipment and Method: Bite block Placement Confirmation: positive ETCO2 Tube secured with: Tape Dental Injury: Teeth and Oropharynx as per pre-operative assessment

## 2023-09-04 NOTE — Anesthesia Procedure Notes (Signed)
 Anesthesia Regional Block: Pectoralis block   Pre-Anesthetic Checklist: , timeout performed,  Correct Patient, Correct Site, Correct Laterality,  Correct Procedure, Correct Position, site marked,  Risks and benefits discussed,  Surgical consent,  Pre-op evaluation,  At surgeon's request and post-op pain management  Laterality: Right  Prep: chloraprep       Needles:  Injection technique: Single-shot  Needle Type: Echogenic Needle     Needle Length: 9cm  Needle Gauge: 21     Additional Needles:   Procedures:,,,, ultrasound used (permanent image in chart),,    Narrative:  Start time: 09/04/2023 7:04 AM End time: 09/04/2023 7:11 AM Injection made incrementally with aspirations every 5 mL.  Performed by: Personally  Anesthesiologist: Peggy Bowens, MD

## 2023-09-04 NOTE — Interval H&P Note (Signed)
 History and Physical Interval Note:  09/04/2023 8:24 AM  Marcia Harvey  has presented today for surgery, with the diagnosis of RIGHT BREAST CANCER.  The various methods of treatment have been discussed with the patient and family. After consideration of risks, benefits and other options for treatment, the patient has consented to  Procedure(s) with comments: BIOPSY, LYMPH NODE, SENTINEL (Right) - SEED LOCALIZED RIGHT AXILLARY LYMPHNODE EXCIONSAL BIOPSY LYMPHADENECTOMY, AXILLARY (Right) - GEN w/PEC BLOCK as a surgical intervention.  The patient's history has been reviewed, patient examined, no change in status, stable for surgery.  I have reviewed the patient's chart and labs.  Questions were answered to the patient's satisfaction.    The procedure has been discussed with the patient. Alternatives to surgery have been discussed with the patient.  Risks of surgery include bleeding,  Infection,  Seroma formation, death,  and the need for further surgery.   The patient understands and wishes to proceed.   Sentinel lymph node mapping and dissection has been discussed with the patient.  Risk of bleeding,  Infection,  Seroma formation,  Additional procedures,,  Shoulder weakness ,  Shoulder stiffness,  Nerve and blood vessel injury and reaction to the mapping dyes have been discussed.  Alternatives to surgery have been discussed with the patient.  The patient agrees to proceed.  Kavir Savoca A Shan Valdes

## 2023-09-04 NOTE — Anesthesia Postprocedure Evaluation (Signed)
 Anesthesia Post Note  Patient: Marcia Harvey  Procedure(s) Performed: BIOPSY, LYMPH NODE, SENTINEL (Right) LYMPHADENECTOMY, AXILLARY (Right)     Patient location during evaluation: PACU Anesthesia Type: General Level of consciousness: awake and alert Pain management: pain level controlled Vital Signs Assessment: post-procedure vital signs reviewed and stable Respiratory status: spontaneous breathing, nonlabored ventilation, respiratory function stable and patient connected to nasal cannula oxygen Cardiovascular status: blood pressure returned to baseline and stable Postop Assessment: no apparent nausea or vomiting Anesthetic complications: no  No notable events documented.  Last Vitals:  Vitals:   09/04/23 1100 09/04/23 1200  BP: 132/68 126/68  Pulse: 77 80  Resp: 14 16  Temp:  (!) 36.3 C  SpO2: 96% 96%    Last Pain:  Vitals:   09/04/23 1200  TempSrc:   PainSc: 3                  Melvenia Stabs

## 2023-09-04 NOTE — Discharge Instructions (Addendum)
 [facility] Office Phone Number [phone]  BREAST BIOPSY/ PARTIAL MASTECTOMY: POST OP INSTRUCTIONS  Always review your discharge instruction sheet given to you by the facility where your surgery was performed.  IF YOU HAVE DISABILITY OR FAMILY LEAVE FORMS, YOU MUST BRING THEM TO THE OFFICE FOR PROCESSING.  DO NOT GIVE THEM TO YOUR DOCTOR.  A prescription for pain medication may be given to you upon discharge.  Take your pain medication as prescribed, if needed.  If narcotic pain medicine is not needed, then you may take acetaminophen  (Tylenol ) or ibuprofen (Advil) as needed. Take your usually prescribed medications unless otherwise directed If you need a refill on your pain medication, please contact your pharmacy.  They will contact our office to request authorization.  Prescriptions will not be filled after 5pm or on week-ends. You should eat very light the first 24 hours after surgery, such as soup, crackers, pudding, etc.  Resume your normal diet the day after surgery. Most patients will experience some swelling and bruising in the breast.  Ice packs and a good support bra will help.  Swelling and bruising can take several days to resolve.  It is common to experience some constipation if taking pain medication after surgery.  Increasing fluid intake and taking a stool softener will usually help or prevent this problem from occurring.  A mild laxative (Milk of Magnesia or Miralax) should be taken according to package directions if there are no bowel movements after 48 hours. Unless discharge instructions indicate otherwise, you may remove your bandages 24-48 hours after surgery, and you may shower at that time.  You may have steri-strips (small skin tapes) in place directly over the incision.  These strips should be left on the skin for 7-10 days.  If your surgeon used skin glue on the incision, you may shower in 24 hours.  The glue will flake off over the next 2-3 weeks.  Any  sutures or staples will be removed at the office during your follow-up visit. ACTIVITIES:  You may resume regular daily activities (gradually increasing) beginning the next day.  Wearing a good support bra or sports bra minimizes pain and swelling.  You may have sexual intercourse when it is comfortable. You may drive when you no longer are taking prescription pain medication, you can comfortably wear a seatbelt, and you can safely maneuver your car and apply brakes. RETURN TO WORK:  ______________________________________________________________________________________ Elene Griffes should see your doctor in the office for a follow-up appointment approximately two weeks after your surgery.  Your doctor's nurse will typically make your follow-up appointment when she calls you with your pathology report.  Expect your pathology report 2-3 business days after your surgery.  You may call to check if you do not hear from us  after three days. OTHER INSTRUCTIONS: _______________________________________________________________________________________________ _____________________________________________________________________________________________________________________________________ _____________________________________________________________________________________________________________________________________ _____________________________________________________________________________________________________________________________________  WHEN TO CALL YOUR DOCTOR: Fever over 101.0 Nausea and/or vomiting. Extreme swelling or bruising. Continued bleeding from incision. Increased pain, redness, or drainage from the incision.  The clinic staff is available to answer your questions during regular business hours.  Please don't hesitate to call and ask to speak to one of the nurses for clinical concerns.  If you have a medical emergency, go to the nearest emergency room or call 911.  A surgeon from [facility] is always on call at the hospital.  For further questions, please visit [email or web address]     No Tylenol  before 1:00pm.  Post Anesthesia Home Care Instructions  Activity: Get plenty  of rest for the remainder of the day. A responsible individual must stay with you for 24 hours following the procedure.  For the next 24 hours, DO NOT: -Drive a car -Advertising copywriter -Drink alcoholic beverages -Take any medication unless instructed by your physician -Make any legal decisions or sign important papers.  Meals: Start with liquid foods such as gelatin or soup. Progress to regular foods as tolerated. Avoid greasy, spicy, heavy foods. If nausea and/or vomiting occur, drink only clear liquids until the nausea and/or vomiting subsides. Call your physician if vomiting continues.  Special Instructions/Symptoms: Your throat may feel dry or sore from the anesthesia or the breathing tube placed in your throat during surgery. If this causes discomfort, gargle with warm salt water. The discomfort should disappear within 24 hours.

## 2023-09-04 NOTE — Op Note (Signed)
 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  Imaging revealed the [facility] and spiral clip to be present.  EBL: Minimal  Indications for procedure: The patient is a 78 year old female who was evaluated due to right axillary adenopathy.  This was picked up on mammography.  Core biopsy showed a poorly differentiated adenocarcinoma.  Breast was a possible primary.  She underwent extensive scanning including PET scan that showed no other areas of increased uptake just except the right axilla.  MRI of the breast showed no primary breast lesion either.  We reviewed this in decided to do a seed localized lymph node biopsy of the suspicious node and possible lymph node dissection depending on findings.  We reviewed lymphedema risk as well and the need for radiation therapy.  Risks and benefits of this procedure were reviewed with the patient.  Other options were reviewed.  Reviewed the risk of bleeding, infection, lymphedema, shoulder stiffness, numbness, potential injury to major vascular structures or nerves, disability, and the need for other treatments or  procedures.   Description of procedure: The patient was met in the holding area and questions were answered.  The right breast was marked as the correct site.  Of note she had a Magseed placed as an outpatient.  Pectoral block administered by anesthesia.  She was taken back to the operating room.  She was placed supine upon the operative table.  After induction of LMA anesthesia, the right breast was  prepped and draped in sterile fashion.  Timeout performed.  I injected 2 cc of mag trace in the central right breast and massaged for 5 minutes.  The mag trace probe was used.  The seed was identified in the right axilla.  Local anesthetic was infiltrated of the signal.  A 5 cm incision was made just below the right axillary hairline.  Dissection was carried out into the level 1 contents.  There is a large cystic necrotic structure which had the Magseed in it.  This appeared to be a necrotic lymph node.  She also had significant lymphadenopathy in the level 1 lymph node basin.  These were all deep axillary lymph nodes.  Given these findings opted to do a completion right axillary lymph node dissection.  The lymphovascular tissue in between the long thoracic nerve, thoracodorsal trunk and extra vein were all dissected out using clips to control any small lymphatic vessels or small vessels in general.  This entire area was cleaned out in its entirety in block.  I used the mag trace probe and identified the signal within the specimen.  This was then placed into the Faxitron and the mag trace seeding biopsy clip identified along with other clips used for the dissection.  Examination of lymph node basin revealed no significant lymphovascular tissue in between the above-mentioned landmarks.  Hemostasis was achieved with cautery.  All of the above-mentioned landmarks were preserved without evidence of injury.  Arista and Surgicel snow were placed in the basin.  19 round drain was placed through separate stab incision.  This was secured with 2-0 nylon.  The incision was then closed with 3-0 Vicryl and 4  Monocryl.  Dermabond applied.  All counts were found to be correct.  The patient was awoke extubated taken to recovery in satisfactory condition.

## 2023-09-04 NOTE — Progress Notes (Signed)
Assisted Dr. Rob Fitzgerald with right, pectoralis, ultrasound guided block. Side rails up, monitors on throughout procedure. See vital signs in flow sheet. Tolerated Procedure well. 

## 2023-09-05 ENCOUNTER — Encounter (HOSPITAL_BASED_OUTPATIENT_CLINIC_OR_DEPARTMENT_OTHER): Payer: Self-pay | Admitting: Surgery

## 2023-09-10 LAB — SURGICAL PATHOLOGY

## 2023-09-11 ENCOUNTER — Encounter: Payer: Self-pay | Admitting: Surgery

## 2023-09-13 ENCOUNTER — Telehealth: Payer: Self-pay | Admitting: Rehabilitation

## 2023-09-13 ENCOUNTER — Encounter: Payer: Self-pay | Admitting: *Deleted

## 2023-09-13 NOTE — Telephone Encounter (Signed)
 Pt called into the PT office to ask some questions about her post-op exercises.  Pt reports she started them and then the next day she had pain in her shoulder blade and this went away and she did them again and then had pain in her neck on both sides and as of today it is located on the left side of the neck with feelings of limited movement.  Pt reports she has a hx of pinched nerves in the neck and it may feel similar to this.  Overall she states her arm feels like it is moving pretty well so I advised her to limit her exercises over the weekend and focus more on active ROM of the neck and heat or a warm shower as this is not the side of surgery.  She will see oncology Monday.

## 2023-09-14 ENCOUNTER — Other Ambulatory Visit: Payer: Self-pay

## 2023-09-14 ENCOUNTER — Encounter (HOSPITAL_BASED_OUTPATIENT_CLINIC_OR_DEPARTMENT_OTHER): Payer: Self-pay | Admitting: Emergency Medicine

## 2023-09-14 ENCOUNTER — Emergency Department (HOSPITAL_BASED_OUTPATIENT_CLINIC_OR_DEPARTMENT_OTHER)

## 2023-09-14 ENCOUNTER — Emergency Department (HOSPITAL_BASED_OUTPATIENT_CLINIC_OR_DEPARTMENT_OTHER)
Admission: EM | Admit: 2023-09-14 | Discharge: 2023-09-15 | Disposition: A | Discharge status: Home / Self Care | Attending: Emergency Medicine | Admitting: Emergency Medicine

## 2023-09-14 DIAGNOSIS — M542 Cervicalgia: Secondary | ICD-10-CM | POA: Insufficient documentation

## 2023-09-14 DIAGNOSIS — Z853 Personal history of malignant neoplasm of breast: Secondary | ICD-10-CM | POA: Diagnosis not present

## 2023-09-14 NOTE — ED Triage Notes (Signed)
 Pt c/o LT side neck pain since Thurs; sts it is a "pinched nerve"; denies injury

## 2023-09-15 MED ORDER — PREDNISONE 10 MG (21) PO TBPK: ORAL_TABLET | Freq: Every day | ORAL | 0 refills | Status: DC

## 2023-09-15 MED ORDER — PREDNISONE 50 MG PO TABS
60.0000 mg | ORAL_TABLET | Freq: Once | ORAL | Status: AC
  Administered 2023-09-15: 60 mg via ORAL
  Filled 2023-09-15: qty 1

## 2023-09-15 MED ORDER — KETOROLAC TROMETHAMINE 15 MG/ML IJ SOLN
15.0000 mg | Freq: Once | INTRAMUSCULAR | Status: AC
Start: 2023-09-15 — End: 2023-09-15
  Administered 2023-09-15: 15 mg via INTRAMUSCULAR
  Filled 2023-09-15: qty 1

## 2023-09-15 NOTE — ED Provider Notes (Signed)
 [name] Provider Note   CSN: [id number] Arrival date & time: 09/14/23  2108     History  Chief Complaint  Patient presents with   Neck Pain    Marcia Harvey is a 78 y.o. 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  She reports she has a history of "neck problems" and believes pain is 2/2 to "pinched nerve".  Has had neck imaging in the past with neurosurgery follow-up.  She denies recent fevers, falls, paresthesia, nor weakness.   Neck Pain Associated symptoms: no chest pain, no fever, no headaches, no numbness and no weakness       Home Medications Prior to Admission medications   Medication Sig Start Date End Date Taking? Authorizing Provider  ascorbic acid (VITAMIN C) 500 MG tablet Take by mouth.    [provider]  atorvastatin (LIPITOR) 40 MG tablet Take 40 mg by mouth daily.    [provider]  augmented betamethasone dipropionate (DIPROLENE-AF) 0.05 % cream Apply topically 2 (two) times daily.    [provider]  b complex vitamins tablet Take 1 tablet by mouth daily.    [provider]  carboxymethylcellul-glycerin (OPTIVE) 0.5-0.9 % ophthalmic solution Apply to eye. 03/24/19   [provider]  Cholecalciferol (VITAMIN D3) 2000 UNITS TABS Take 2,000 Units by mouth daily.    [provider]  lisinopril -hydrochlorothiazide  (PRINZIDE ,ZESTORETIC ) 10-12.5 MG per tablet Take 1 tablet by mouth daily. NEED VISIT, LABS!!  2nd NOTICE! 10/31/12   Egan, Eleanore E, PA-C  omeprazole (PRILOSEC) 40 MG capsule TAKE 1 CAPSULE BY MOUTH EVERY DAY 10/12/16   [provider]  oxyCODONE  (OXY IR/ROXICODONE ) 5 MG immediate release tablet Take 1 tablet (5 mg total) by mouth every 6 (six) hours as needed for severe pain (pain score 7-10). 09/04/23   Cornett, Andy Bannister, MD   predniSONE  (STERAPRED UNI-PAK 21 TAB) 10 MG (21) TBPK tablet Take by mouth daily. Take 6 tabs by mouth daily  for 2 days, then 5 tabs for 2 days, then 4 tabs for 2 days, then 3 tabs for 2 days, 2 tabs for 2 days, then 1 tab by mouth daily for 2 days 09/15/23   Royann Cords, PA  sertraline (ZOLOFT) 25 MG tablet TAKE 1 TABLET(25 MG) BY MOUTH DAILY 06/22/16   [provider]  traZODone (DESYREL) 50 MG tablet 25 to 50 mg at bedtime to help with sleep 03/12/19   [provider]      Allergies    Codeine, Iodinated contrast media, Iohexol, Shellfish-derived products, Bee pollen, Iodine, Wound dressing adhesive, and Tape    Review of Systems   Review of Systems  Constitutional:  Negative for chills, fatigue and fever.  Respiratory:  Negative for cough, chest tightness, shortness of breath and wheezing.   Cardiovascular:  Negative for chest pain and palpitations.  Gastrointestinal:  Negative for abdominal pain, constipation, diarrhea, nausea and vomiting.  Musculoskeletal:  Positive for neck pain.  Neurological:  Negative for dizziness, seizures, weakness, light-headedness, numbness and headaches.    Physical Exam Updated Vital Signs BP (!) 172/90 (BP Location: Right Arm)   Pulse 81   Temp 97.9 F (36.6 C) (Oral)   Resp 20   Ht 5\' 3"  (1.6 m)   Wt 77.7 kg   SpO2 96%   BMI 30.34 kg/m  Physical Exam Vitals  and nursing note reviewed.  Constitutional:      General: She is not in acute distress.    Appearance: Normal appearance.  HENT:     Head: Normocephalic and atraumatic.  Eyes:     Conjunctiva/sclera: Conjunctivae normal.  Neck:     Comments: TTP of neck diffusely at paraspinous musculature. Mild decreased rom of neck to left side. No bony tenderness. No palpable masses, crepitus to neck Cardiovascular:     Rate and Rhythm: Normal rate.  Pulmonary:     Effort: Pulmonary effort is normal. No respiratory distress.     Breath sounds: Normal breath sounds.  Skin:     Capillary Refill: Capillary refill takes less than 2 seconds.     Coloration: Skin is not jaundiced or pale.     Comments: Right breast surgical wound well healed. No purulent drainage nor surrounding erythema nor cellulitis  Neurological:     Mental Status: She is alert and oriented to person, place, and time. Mental status is at baseline.     Sensory: No sensory deficit.     Motor: No weakness.     Coordination: Coordination normal.     Gait: Gait normal.     Comments: Motor 5/5 and sensation 2/2 of BUE and BLE.  Following commands appropriately.  Ambulates without difficulty     ED Results / Procedures / Treatments   Labs (all labs ordered are listed, but only abnormal results are displayed) Labs Reviewed - No data to display  EKG None  Radiology VAS US  UPPER EXTREMITY VENOUS DUPLEX Result Date: 09/16/2023 UPPER VENOUS STUDY  Patient Name:  [name]  Date of Exam:   09/16/2023 Medical Rec #: [id number]        Accession #:    [id number] Date of Birth: 1945/05/16        Patient Gender: F Patient Age:   54 years Exam Location:  [address] Procedure:      VAS US  UPPER EXTREMITY VENOUS DUPLEX Referring Phys: Murleen Arms --------------------------------------------------------------------------------  Indications: Pain, and Swelling Risk Factors: Surgery 09/04/23: Right axillary seed localized lymph node biopsy with injection of mag trace and completion axillary lymph node dissection. Comparison Study: None Performing Technologist: [employer] BS, RVT, RDCS  Examination Guidelines: A complete evaluation includes B-mode imaging, spectral Doppler, color Doppler, and power Doppler as needed of all accessible portions of each vessel. Bilateral testing is considered an integral part of a complete examination. Limited examinations for reoccurring indications may be performed as noted.  Right Findings: +----------+------------+---------+-----------+----------+-------+ RIGHT      CompressiblePhasicitySpontaneousPropertiesSummary +----------+------------+---------+-----------+----------+-------+ IJV           Full       Yes       Yes                      +----------+------------+---------+-----------+----------+-------+ Subclavian    Full       Yes       Yes                      +----------+------------+---------+-----------+----------+-------+ Axillary      Full       Yes       Yes                      +----------+------------+---------+-----------+----------+-------+ Brachial      Full       Yes       Yes                      +----------+------------+---------+-----------+----------+-------+  Cephalic      Full       Yes       Yes                      +----------+------------+---------+-----------+----------+-------+ Basilic       Full       Yes       Yes                      +----------+------------+---------+-----------+----------+-------+  Left Findings: +----------+------------+---------+-----------+----------+-------+ LEFT      CompressiblePhasicitySpontaneousPropertiesSummary +----------+------------+---------+-----------+----------+-------+ IJV           Full       Yes       Yes                      +----------+------------+---------+-----------+----------+-------+ Subclavian               Yes       Yes                      +----------+------------+---------+-----------+----------+-------+  Findings reported to Dr. Lorella Roles nurse line at 2:25PM.  Summary:  Right: No evidence of deep vein thrombosis in the upper extremity. No evidence of superficial vein thrombosis in the upper extremity.  *See table(s) above for measurements and observations.  Diagnosing physician: Genny Kid MD Electronically signed by Genny Kid MD on 09/16/2023 at 4:16:19 PM.    Final     Procedures Procedures    Medications Ordered in ED Medications  predniSONE  (DELTASONE ) tablet 60 mg (60 mg Oral Given 09/15/23 0121)  ketorolac   (TORADOL ) 15 MG/ML injection 15 mg (15 mg Intramuscular Given 09/15/23 0121)    ED Course/ Medical Decision Making/ A&P                                 Medical Decision Making Amount and/or Complexity of Data Reviewed Radiology: ordered.  Risk Prescription drug management.   Patient presents to the ED for concern of neck pain, this involves an extensive number of treatment options, and is a complaint that carries with it a high risk of complications and morbidity.  The differential diagnosis includes no traumatic injury, cervical radiculopathy, narrowing, CVA/TIA   Co morbidities that complicate the patient evaluation  See HPI   Additional history obtained:  Additional history obtained from  Nursing and Outside Medical Records   External records from outside source obtained and reviewed including triage note see    Imaging Studies ordered:  I ordered imaging studies including CT cervical spine without contrast I independently visualized and interpreted imaging which showed  Multilevel spondylosis as described. Of note there is moderate spinal canal narrowing at C6-C7 and advanced neural foraminal narrowing bilaterally at C3-C4 and on the right at C5-C6 I agree with the radiologist interpretation     Medicines ordered and prescription drug management:  I ordered medication including prednisone , Toradol  for neck pain Reevaluation of the patient after these medicines showed that the patient improved I have reviewed the patients home medicines and have made adjustments as needed     Problem List / ED Course:  Neck pain Likely related to chronic neck pathology -already follows neurosurgery Fortunately no sensory nor motor deficits.  Low suspicion for CVA/TIA as etiology No recent fall also no suspicion for ICH Will provide prednisone  burst for symptoms as this has helped in  the past.  Provided 1 dose in the emergency department as she is unable to pick up dose  today.  I discussed to not take initial doses have given her 1 in the ED when she gets prescription tomorrow   Reevaluation:  After the interventions noted above, I reevaluated the patient and found that they have :improved     Dispostion:  After consideration of the diagnostic results and the patients response to treatment, I feel that the patent would benefit from outpatient management with neurosurgery follow-up.    Discussed ED workup, disposition, return to ED precautions with patient who expresses understanding agrees with plan.  All questions answered to their satisfaction.  They are agreeable to plan.  Discharge instructions provided on paperwork Final Clinical Impression(s) / ED Diagnoses Final diagnoses:  Neck pain    Rx / DC Orders ED Discharge Orders          Ordered    predniSONE  (STERAPRED UNI-PAK 21 TAB) 10 MG (21) TBPK tablet  Daily,   Status:  Discontinued        09/15/23 0057    predniSONE  (STERAPRED UNI-PAK 21 TAB) 10 MG (21) TBPK tablet  Daily        09/15/23 0101              Royann Cords, PA 09/17/23 0120    Quinn Bucco, DO 09/28/23 [phone]

## 2023-09-15 NOTE — ED Notes (Signed)
 Questions and concerns addressed. Discharge teaching completed.   Prescriptions reviewed and pharmacy verified.   Pt ambulatory upon discharge.

## 2023-09-15 NOTE — Discharge Instructions (Addendum)
 Thank you for letting us  evaluate you today.  I have sent a steroid burst to your [facility] pharmacy.  Please follow-up with neurosurgery if you continue to have symptoms  Return to emergency department if you have neck rigidity especially in setting of fever

## 2023-09-16 ENCOUNTER — Inpatient Hospital Stay: Attending: Hematology and Oncology | Admitting: Hematology and Oncology

## 2023-09-16 ENCOUNTER — Ambulatory Visit (HOSPITAL_BASED_OUTPATIENT_CLINIC_OR_DEPARTMENT_OTHER)
Admission: RE | Admit: 2023-09-16 | Discharge: 2023-09-16 | Disposition: A | Discharge status: Home / Self Care | Source: Ambulatory Visit | Attending: Cardiovascular Disease | Admitting: Cardiovascular Disease

## 2023-09-16 VITALS — BP 120/61 | HR 81 | Temp 98.0°F | Resp 17 | Wt 173.1 lb

## 2023-09-16 DIAGNOSIS — Z171 Estrogen receptor negative status [ER-]: Secondary | ICD-10-CM | POA: Insufficient documentation

## 2023-09-16 DIAGNOSIS — Z79899 Other long term (current) drug therapy: Secondary | ICD-10-CM | POA: Insufficient documentation

## 2023-09-16 DIAGNOSIS — G8918 Other acute postprocedural pain: Secondary | ICD-10-CM | POA: Insufficient documentation

## 2023-09-16 DIAGNOSIS — M7989 Other specified soft tissue disorders: Secondary | ICD-10-CM

## 2023-09-16 DIAGNOSIS — I890 Lymphedema, not elsewhere classified: Secondary | ICD-10-CM | POA: Diagnosis not present

## 2023-09-16 DIAGNOSIS — C50611 Malignant neoplasm of axillary tail of right female breast: Secondary | ICD-10-CM | POA: Insufficient documentation

## 2023-09-16 NOTE — Progress Notes (Signed)
 [facility] CONSULT NOTE  Patient Care Team: Alfredia Ina, MD as PCP - General (Family Medicine) Jhonny Moss, MD as Consulting Physician (Neurology) Auther Bo, RN as Oncology Nurse Navigator Alane Hsu, RN as Oncology Nurse Navigator Sim Dryer, MD as Consulting Physician (General Surgery) Murleen Arms, MD as Consulting Physician (Hematology and Oncology) Retta Caster, MD as Consulting Physician (Radiation Oncology)  CHIEF COMPLAINTS/PURPOSE OF CONSULTATION:  Newly diagnosed breast cancer  HISTORY OF PRESENTING ILLNESS:  Marcia Harvey 78 y.o. female is here because of recent diagnosis of right axillary mass  I reviewed her records extensively and collaborated the history with the patient.  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, estrogen receptor negative (HCC)   07/31/2023 Cancer Staging   Staging form: Breast, AJCC 8th Edition - Clinical stage from 07/31/2023: Stage Unknown (cTX, cN1, cM0, ER-, PR-, HER2-) - Signed by Murleen Arms, MD on 07/31/2023 Stage prefix: Initial diagnosis Laterality: Right Staged by: Pathologist and managing physician Stage used in treatment planning: Yes National guidelines used in treatment planning: Yes Type of national guideline used in treatment planning: NCCN   08/12/2023 Genetic Testing   Single, 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  Report date is 08/12/2023.   The CancerNext-Expanded gene panel offered by [facility] and includes sequencing, rearrangement, and RNA analysis for the following 76 genes: AIP, ALK, APC, ATM, AXIN2, BAP1, BARD1, BMPR1A, BRCA1, BRCA2, BRIP1, CDC73, CDH1, CDK4, CDKN1B, CDKN2A, CEBPA, CHEK2, CTNNA1, DDX41, DICER1, ETV6, FH, FLCN, GATA2, LZTR1, MAX, MBD4, MEN1, MET, MLH1, MSH2, MSH3, MSH6, MUTYH, NF1, NF2, NTHL1, PALB2, PHOX2B, PMS2, POT1, PRKAR1A, PTCH1, PTEN, RAD51C, RAD51D, RB1, RET, RUNX1, SDHA, SDHAF2, SDHB, SDHC, SDHD, SMAD4, SMARCA4, SMARCB1, SMARCE1, STK11, SUFU, TMEM127, TP53, TSC1, TSC2, VHL, and WT1 (sequencing and deletion/duplication); EGFR, HOXB13, KIT, MITF, PDGFRA, POLD1, and POLE (sequencing only); EPCAM and GREM1 (deletion/duplication only).      Discussed the use of AI scribe software for clinical note transcription with the patient, who gave verbal consent to proceed.  History of Present Illness Marcia Harvey is a 78 year old female with squamous cell carcinoma who presents for follow-up after axillary lymph node dissection.  She underwent axillary lymph node dissection, and pathology revealed metastatic squamous cell carcinoma, suspected to have originated from a previously treated skin cancer on the right shoulder. The initial skin cancer was removed in February 2022, and it was an invasive squamous cell carcinoma with clear margins at that time. No lymph nodes were removed during the initial surgery.  She experiences numbness and soreness in the right axilla postoperatively, describing the area as 'extremely sore to the touch' and 'numb from here to here.' She has been performing postoperative exercises but developed a pinched nerve in her neck, for which she visited the emergency department and was prescribed prednisone . The soreness in the axilla started last week and is exacerbated by the compression bra she wears, which she fears may  cause skin irritation.  She has a history of regular dermatological check-ups twice a year, where precancerous lesions on her face are monitored and treated with cryotherapy. She has an upcoming appointment with her dermatologist next week.  A recent PET scan did not reveal any additional metastatic disease beyond the known right axilla. She has not previously seen a radiation oncologist.  Her family history is notable for her mother having had colon cancer. 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    MEDICAL HISTORY:  Past Medical History:  Diagnosis Date   Anxiety    Anxiety    Arthritis    Cancer (HCC) 07/2020   SCC chest   Depression    Fall from horse 01/27/12   CT chest/Abd/Pelvis done at [facility]. Findings: Nondisplaced fx of posterior right 11th rib   GERD (gastroesophageal reflux disease)    Hypertension    Seizures (HCC)    been through testing, no issues with seizures or epilepsy   Syncopal episodes    seizure with episode   White matter disease     SURGICAL HISTORY: Past Surgical History:  Procedure Laterality Date   AXILLARY LYMPH NODE DISSECTION Right 09/04/2023   Procedure: Alean Amen;  Surgeon: Sim Dryer, MD;  Location: [facility];  Service: General;  Laterality: Right;  GEN w/PEC BLOCK   BREAST LUMPECTOMY WITH RADIOACTIVE SEED LOCALIZATION Left 06/21/2020   Procedure: LEFT BREAST LUMPECTOMY WITH RADIOACTIVE SEED LOCALIZATION;  Surgeon: Dareen Ebbing, MD;  Location: [facility];  Service: General;  Laterality: Left;   BUNIONECTOMY Bilateral 1978   COLONOSCOPY     EYE SURGERY     MELANOMA EXCISION Right 08/02/2020   Procedure: 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;  Surgeon: Dareen Ebbing, MD;  Location: [facility];  Service: General;  Laterality: Right;   ROOM 8 STARTING AT 04:15PM FOR 45 MIN   SENTINEL NODE BIOPSY Right 09/04/2023   Procedure: BIOPSY, LYMPH NODE, SENTINEL;  Surgeon: Sim Dryer, MD;  Location: [facility];  Service: General;  Laterality: Right;  SEED LOCALIZED RIGHT AXILLARY LYMPHNODE EXCIONSAL BIOPSY    SOCIAL HISTORY: Social History   Socioeconomic History   Marital status: Single    Spouse name: Not on file   Number of children: Not on file   Years of education: Not on file   Highest education level: Not on file  Occupational History   Not on file  Tobacco Use   Smoking status: Former    Current packs/day: 0.00    Types: Cigarettes    Quit date: 05/21/2003    Years since quitting: 20.3   Smokeless tobacco: Never  Vaping Use   Vaping status: Never Used  Substance and Sexual Activity   Alcohol use: Not Currently    Comment: 3-4x per year   Drug use: No   Sexual activity: Not Currently    Birth control/protection: Post-menopausal  Other Topics Concern   Not on file  Social History Narrative   Are you right handed or left handed? right   Are you currently employed ? no   What is your current occupation? Retired    Do you live at home alone? Alone    Who lives with you? NA    What type of home do you live in: 1 story or 2 story?  1 story  Social Drivers of Corporate investment banker Strain: Low Risk  (07/22/2023)   Received from [facility]   Overall Financial Resource Strain (CARDIA)    Difficulty of Paying Living Expenses: Not very hard  Food Insecurity: No Food Insecurity (07/31/2023)   Hunger Vital Sign    Worried About Running Out of Food in the Last Year: Never true    Ran Out of Food in the Last Year: Never true  Transportation Needs: No Transportation Needs (07/31/2023)   PRAPARE - Administrator, Civil Service (Medical): No    Lack of Transportation (Non-Medical): No  Physical Activity: Sufficiently Active (07/22/2023)   Received from [facility]    Exercise Vital Sign    Days of Exercise per Week: 5 days    Minutes of Exercise per Session: 60 min  Stress: Stress Concern Present (07/22/2023)   Received from [facility] - Occupational Stress Questionnaire    Feeling of Stress : To some extent  Social Connections: Socially Integrated (07/22/2023)   Received from [facility]   Social Network    How would you rate your social network (family, work, friends)?: Good participation with social networks  Intimate Partner Violence: Not At Risk (07/31/2023)   Humiliation, Afraid, Rape, and Kick questionnaire    Fear of Current or Ex-Partner: No    Emotionally Abused: No    Physically Abused: No    Sexually Abused: No    FAMILY HISTORY: Family History  Problem Relation Age of Onset   Colon cancer Mother 28       d. 86   Prostate cancer Father 3   Lung cancer Maternal Uncle        dx >50   Cancer Paternal Aunt        unknown type; mets; d. >50   Diabetes Maternal Grandfather    Diabetes Paternal Grandmother     ALLERGIES:  is allergic to codeine, iodinated contrast media, iohexol, shellfish-derived products, bee pollen, iodine, wound dressing adhesive, and tape.  MEDICATIONS:  Current Outpatient Medications  Medication Sig Dispense Refill   ascorbic acid (VITAMIN C) 500 MG tablet Take by mouth.     atorvastatin (LIPITOR) 40 MG tablet Take 40 mg by mouth daily.     augmented betamethasone dipropionate (DIPROLENE-AF) 0.05 % cream Apply topically 2 (two) times daily.     b complex vitamins tablet Take 1 tablet by mouth daily.     carboxymethylcellul-glycerin (OPTIVE) 0.5-0.9 % ophthalmic solution Apply to eye.     Cholecalciferol (VITAMIN D3) 2000 UNITS TABS Take 2,000 Units by mouth daily.     lisinopril -hydrochlorothiazide  (PRINZIDE ,ZESTORETIC ) 10-12.5 MG per tablet Take 1 tablet by mouth daily. NEED VISIT, LABS!!  2nd NOTICE! 30 tablet 0   omeprazole (PRILOSEC) 40 MG capsule TAKE 1  CAPSULE BY MOUTH EVERY DAY     oxyCODONE  (OXY IR/ROXICODONE ) 5 MG immediate release tablet Take 1 tablet (5 mg total) by mouth every 6 (six) hours as needed for severe pain (pain score 7-10). 15 tablet 0   predniSONE  (STERAPRED UNI-PAK 21 TAB) 10 MG (21) TBPK tablet Take by mouth daily. Take 6 tabs by mouth daily  for 2 days, then 5 tabs for 2 days, then 4 tabs for 2 days, then 3 tabs for 2 days, 2 tabs for 2 days, then 1 tab by mouth daily for 2 days 42 tablet 0   sertraline (ZOLOFT) 25 MG tablet TAKE 1 TABLET(25 MG) BY MOUTH  DAILY     traZODone (DESYREL) 50 MG tablet 25 to 50 mg at bedtime to help with sleep     No current facility-administered medications for this visit.    REVIEW OF SYSTEMS:   Constitutional: Denies fevers, chills or abnormal night sweats Eyes: Denies blurriness of vision, double vision or watery eyes Ears, nose, mouth, throat, and face: Denies mucositis or sore throat Respiratory: Denies cough, dyspnea or wheezes Cardiovascular: Denies palpitation, chest discomfort or lower extremity swelling Gastrointestinal:  Denies nausea, heartburn or change in bowel habits Skin: Denies abnormal skin rashes Lymphatics: Denies new lymphadenopathy or easy bruising Neurological:Denies numbness, tingling or new weaknesses Behavioral/Psych: Mood is stable, no new changes  Breast: Denies any palpable lumps or discharge.  Palpable right axillary mass All other systems were reviewed with the patient and are negative.  PHYSICAL EXAMINATION: ECOG PERFORMANCE STATUS: 0 - Asymptomatic  Vitals:   09/16/23 1201  BP: 120/61  Pulse: 81  Resp: 17  Temp: 98 F (36.7 C)  SpO2: 95%   Filed Weights   09/16/23 1201  Weight: 173 lb 1.6 oz (78.5 kg)    GENERAL:alert, no distress and comfortable Right axilla healing well. Drain in place.  LABORATORY DATA:  I have reviewed the data as listed Lab Results  Component Value Date   WBC 8.6 06/07/2014   HGB 15.0 06/07/2014   HCT 44.0  06/07/2014   MCV 83.9 06/07/2014   PLT 273 06/07/2014   Lab Results  Component Value Date   NA 139 09/02/2023   K 4.0 09/02/2023   CL 103 09/02/2023   CO2 25 09/02/2023    RADIOGRAPHIC STUDIES: I have personally reviewed the radiological reports and agreed with the findings in the report.  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 PET scan showed no other primary or widespread metastasis. Tumor excised with negative margins. Adjuvant radiation therapy recommended due to recurrence risk. Systemic therapy with cemiplimab will be considered based on C POST data - Refer to radiation oncology for adjuvant radiation therapy to the right axilla. - Review guidelines for cemiplimab in adjuvant setting for metastatic squamous cell carcinoma. - Email sent to Dr. Eloise Hake and Dr. Cornett regarding adjuvant radiation therapy.  Postoperative pain and numbness Postoperative numbness and soreness in the right axilla. Numbness common postoperatively; sensation may return.  Soreness may indicate thrombophlebitis or inflammation. Ultrasound done, no DVT - Advise use of cold compresses and topical analgesics for symptomatic relief. - Continue PT for lymphedema management.  Pinched nerve in neck Pinched nerve in neck exacerbated by postoperative exercises. Managed with prednisone . Cautious resumption of exercises advised. - Advise continuation of prednisone  as prescribed. - Recommend resuming postoperative exercises cautiously, monitoring for exacerbation of neck symptoms.  Time spent: 40 min reviewing pathology, discussing about treatment options, answering all questions, documentation, coordination of care.  Murleen Arms  MD       All questions were answered. The patient knows to call the clinic with any problems, questions or concerns.    Murleen Arms, MD 09/16/23

## 2023-09-16 NOTE — Assessment & Plan Note (Addendum)
 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 PET scan showed no other primary or widespread metastasis. Tumor excised with negative margins. Adjuvant radiation therapy recommended due to recurrence risk. Systemic therapy with cemiplimab will be considered based on C POST data - Refer to radiation oncology for adjuvant radiation therapy to the right axilla. - Review guidelines for cemiplimab in adjuvant setting for metastatic squamous cell carcinoma. - Email sent to Dr. Eloise Hake and Dr. Cornett regarding adjuvant radiation therapy.  Postoperative pain and numbness Postoperative numbness and soreness in the right axilla. Numbness common postoperatively; sensation may return.  Soreness may indicate thrombophlebitis or inflammation. Ultrasound done, no DVT - Advise use of cold compresses and topical analgesics for symptomatic relief. - Continue PT for lymphedema management.  Pinched nerve in neck Pinched nerve in neck exacerbated by postoperative exercises. Managed with prednisone . Cautious resumption of exercises advised. - Advise continuation of prednisone  as prescribed. - Recommend resuming postoperative exercises cautiously, monitoring for exacerbation of neck symptoms.  Time spent: 40 min reviewing pathology, discussing about treatment options, answering all questions, documentation, coordination of care.  Murleen Arms MD

## 2023-09-17 ENCOUNTER — Telehealth: Payer: Self-pay | Admitting: Hematology and Oncology

## 2023-09-17 ENCOUNTER — Encounter: Payer: Self-pay | Admitting: *Deleted

## 2023-09-17 DIAGNOSIS — C50611 Malignant neoplasm of axillary tail of right female breast: Secondary | ICD-10-CM

## 2023-09-17 NOTE — Telephone Encounter (Signed)
 SPOKE WITH PATIENT ABOUT UPCOMING APPOINTMENT.

## 2023-09-18 NOTE — Progress Notes (Signed)
 START OFF PATHWAY REGIMEN - Other   [id number]:Cemiplimab 350 mg IV D1 q21 Days:   A cycle is every 21 days:     Cemiplimab-rwlc   **Always confirm dose/schedule in your pharmacy ordering system**  Patient Characteristics: Intent of Therapy: Curative Intent, Discussed with Patient

## 2023-09-18 NOTE — Progress Notes (Signed)
Melanoma and Other Skin Cancers - No Medical Intervention - Off Treatment.    Patient Characteristics:  Cutaneous Squamous Cell Carcinoma, Postoperative without Neoadjuvant Therapy  Disease Classification: Cutaneous Squamous Cell Carcinoma  Therapeutic Status: Postoperative without Neoadjuvant Therapy  Click here if multiple primary tumors are present.: false

## 2023-10-01 NOTE — Progress Notes (Signed)
 Radiation Oncology         [phone] ________________________________  Name: Marcia Harvey MRN: [id number]  Date: 10/02/2023  DOB: 10/16/1945  Re-Evaluation Note  CC: Alfredia Ina, MD  Murleen Arms, MD    ICD-10-CM   1. 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 She was seen in the multidisciplinary breast clinic on 07/31/23  Since consultation, she underwent genetic testing on 07/31/23. 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   She opted to proceed with a right breast seed lumpectomy possible right axillary lymph node dissection on 09/04/23 under the care of Dr. Afton Horse . Surgical pathology from the procedure revealed: Invasive poorly differentiated squamous cell carcinoma; tumor size of 4.8 cm, with cystic change; No definite evidence of lymphovascular or perineural invasion; Resection margins appear negative for carcinoma; all examined lymph nodes are negative for invasive disease. Immunohistochemical stains show that the tumor cells are positive for CK7, p63 and CK5/6 while they are negative for CK20 and GATA3.  Patient presented for a follow up with Dr. Arno Bibles on 09/16/23 during which she complained of numbness and soreness in the right axilla postoperatively. She was then referred to radiation oncology for adjuvant radiation therapy to the right axilla. Patient also had an DVT evaluation which was negative for DVT.           Of note: patient presented to the ED on 09/14/23 for neck pain for which she was treated with prednisone   with.   On review of systems, the patient reports significant skin sensitivity along the surgical bed and upper arm area consistent with nerve irritation.  Increasing doses of Neurontin  have helped with this issue.  She also notices some swelling in the surgical bed and upper arm area and is scheduled for physical therapy evaluation later this week..  She denies any weakness in her right arm or hand area.   Allergies:  is allergic to codeine, iodinated contrast media, iohexol, shellfish-derived products, bee pollen, iodine, wound dressing adhesive, and tape.  Meds: Current Outpatient Medications  Medication Sig Dispense Refill   ascorbic acid (VITAMIN C) 500 MG tablet Take by mouth.     atorvastatin (LIPITOR) 40 MG tablet Take 40 mg by mouth daily.     augmented betamethasone dipropionate (DIPROLENE-AF) 0.05 % cream Apply topically 2 (two) times daily.     b complex vitamins tablet Take 1 tablet by mouth daily.     carboxymethylcellul-glycerin (OPTIVE) 0.5-0.9 % ophthalmic solution Apply to eye.     Cholecalciferol (VITAMIN D3) 2000 UNITS TABS Take 2,000 Units by mouth daily.     lisinopril -hydrochlorothiazide  (PRINZIDE ,ZESTORETIC ) 10-12.5 MG per tablet Take 1 tablet by mouth daily. NEED VISIT, LABS!!  2nd NOTICE! 30 tablet 0   omeprazole (PRILOSEC) 40 MG capsule TAKE 1 CAPSULE BY MOUTH EVERY DAY     oxyCODONE  (OXY IR/ROXICODONE ) 5 MG immediate release tablet Take 1 tablet (5 mg total) by mouth every 6 (six) hours as needed for severe pain (pain score 7-10). 15 tablet 0  sertraline (ZOLOFT) 25 MG tablet TAKE 1 TABLET(25 MG) BY MOUTH DAILY     traZODone (DESYREL) 50 MG tablet 25 to 50 mg at bedtime to help with sleep     predniSONE  (STERAPRED UNI-PAK 21 TAB) 10 MG (21) TBPK tablet Take by mouth daily. Take 6 tabs by mouth daily  for 2 days, then 5 tabs for 2 days, then 4 tabs for 2 days, then 3 tabs for 2 days, 2 tabs for 2 days, then 1 tab by mouth daily for 2 days (Patient not taking: Reported  on 10/02/2023) 42 tablet 0   No current facility-administered medications for this encounter.    Physical Findings: The patient is in no acute distress. Patient is alert and oriented.  height is 5\' 3"  (1.6 m) and weight is 170 lb 12.8 oz (77.5 kg). Her temperature is 97.5 F (36.4 C) (abnormal). Her blood pressure is 144/84 (abnormal) and her pulse is 85. Her respiration is 18 and oxygen saturation is 97%.   Lungs are clear to auscultation bilaterally. Heart has regular rate and rhythm. No palpable cervical, supraclavicular, or axillary adenopathy. Abdomen soft, non-tender, normal bowel sounds. Examination of the right breast and axillary area reveals a surgical scar measuring approximately 5 cm in the upper chest adjacent to her clavicular head.  This is the site of her previous skin cancer surgery. She has a scar in the axillary region from her most recent surgery which is healing well without signs of drainage or infection.  Some swelling noted in the axillary and upper breast area as well as right upper arm area.   Lab Findings: Lab Results  Component Value Date   WBC 8.6 06/07/2014   HGB 15.0 06/07/2014   HCT 44.0 06/07/2014   MCV 83.9 06/07/2014   PLT 273 06/07/2014    Radiographic Findings: VAS US  UPPER EXTREMITY VENOUS DUPLEX Result Date: 09/16/2023 UPPER VENOUS STUDY  Patient Name:  Marcia Harvey  Date of Exam:   09/16/2023 Medical Rec #: [id number]        Accession #:    [id number] Date of Birth: 02-19-1946        Patient Gender: F Patient Age:   25 years Exam Location:  [address] Procedure:      VAS US  UPPER EXTREMITY VENOUS DUPLEX Referring Phys: Murleen Arms --------------------------------------------------------------------------------  Indications: Pain, and Swelling Risk Factors: Surgery 09/04/23: Right axillary seed localized lymph node biopsy with injection of mag trace and completion axillary lymph node dissection. Comparison Study: None Performing Technologist: [facility] BS, RVT, RDCS  Examination Guidelines: A complete evaluation includes B-mode imaging, spectral Doppler, color Doppler, and power Doppler as needed of all accessible portions of each vessel. Bilateral testing is considered an integral part of a complete examination. Limited examinations for reoccurring indications may be performed as noted.  Right Findings: +----------+------------+---------+-----------+----------+-------+ RIGHT     CompressiblePhasicitySpontaneousPropertiesSummary +----------+------------+---------+-----------+----------+-------+ IJV           Full       Yes       Yes                      +----------+------------+---------+-----------+----------+-------+ Subclavian    Full       Yes       Yes                      +----------+------------+---------+-----------+----------+-------+ Axillary      Full       Yes  Yes                      +----------+------------+---------+-----------+----------+-------+ Brachial      Full       Yes       Yes                      +----------+------------+---------+-----------+----------+-------+ Cephalic      Full       Yes       Yes                      +----------+------------+---------+-----------+----------+-------+ Basilic       Full       Yes       Yes                      +----------+------------+---------+-----------+----------+-------+  Left Findings: +----------+------------+---------+-----------+----------+-------+ LEFT      CompressiblePhasicitySpontaneousPropertiesSummary +----------+------------+---------+-----------+----------+-------+ IJV           Full       Yes       Yes                      +----------+------------+---------+-----------+----------+-------+ Subclavian               Yes       Yes                      +----------+------------+---------+-----------+----------+-------+  Findings reported to Dr. Lorella Roles nurse line at 2:25PM.  Summary:  Right: No evidence of deep  vein thrombosis in the upper extremity. No evidence of superficial vein thrombosis in the upper extremity.  *See table(s) above for measurements and observations.  Diagnosing physician: Genny Kid MD Electronically signed by Genny Kid MD on 09/16/2023 at 4:16:19 PM.    Final    CT Cervical Spine Wo Contrast Result Date: 09/14/2023 CLINICAL DATA:  Left-sided neck pain since Thursday. No known injury. EXAM: CT CERVICAL SPINE WITHOUT CONTRAST TECHNIQUE: Multidetector CT imaging of the cervical spine was performed without intravenous contrast. Multiplanar CT image reconstructions were also generated. RADIATION DOSE REDUCTION: This exam was performed according to the departmental dose-optimization program which includes automated exposure control, adjustment of the mA and/or kV according to patient size and/or use of iterative reconstruction technique. COMPARISON:  MRI cervical spine 03/28/2023 FINDINGS: Alignment: Loss of lordosis is likely chronic/positional. No evidence of traumatic listhesis. Skull base and vertebrae: No acute fracture. Soft tissues and spinal canal: No prevertebral fluid or swelling. No visible canal hematoma. Disc levels: Multilevel spondylosis, disc space height loss, and degenerative endplate changes. This is advanced at C3-C4, C4-C5, and C6-C7. Advanced facet arthropathy on the right at C2-C3. Posterior disc osteophyte complexes cause multilevel effacement of the ventral thecal sac greatest at C6-C7 where it is moderate. Uncovertebral spurring and facet arthropathy cause multilevel neural foraminal narrowing which is greatest bilateral at C3-C4 and on the right at C5-C6 where it is advanced. Upper chest: No acute abnormality. Other: Carotid calcification. IMPRESSION: Multilevel spondylosis as described. Of note there is moderate spinal canal narrowing at C6-C7 and advanced neural foraminal narrowing bilaterally at C3-C4 and on the right at C5-C6. Electronically Signed   By: Rozell Cornet M.D.   On: 09/14/2023 22:58    Impression: Recurrent squamous cell carcinoma of the skin  In early 2022 the patient presented with a enlarging lesion in the right upper chest adjacent to the right  clavicle.  According the patient this grew rather rapidly and had a angry appearance.  Please see enclosed images in the media section that the patient shared with me today concerning this skin cancer.  Patient was seen by Dr. Eli Grizzle and proceeded to undergo biopsy followed by wide local excision.  This pathology showed invasive moderately differentiated squamous cell carcinoma extending over 1.5 cm.  There was focal perineural invasion.  The surgical margins were negative for carcinoma.  No adjuvant treatment was given at that time.   Earlier this year,  the patient was felt to have an occult right breast cancer when she presented in the breast clinic.  She initially was felt to have a poorly differentiated carcinoma which was ER/PR negative on initial lymph node biopsy.  At the time of her axillary surgery she was found to have an invasive poorly differentiated squamous cell carcinoma measuring 4.8 cm in size.  There was no definitive evidence of lymphovascular or perineural invasion.  The surgical margins were negative.  6 additional benign lymph nodes were recovered at the time of her axillary surgery.  In light of the patient's prior history of skin cancer in an adjacent region this most likely represents recurrent skin cancer with metastasis to the axillary region.  Fortunately her most recent workup showed no other sites of disease and no distant metastasis.  Given the size of the lesion and the recurrent nature of her situation, NCCN guidelines recommend consideration for postoperative radiation therapy in this situation.  I discussed the overall course of treatment, anticipated side effects and potential long-term toxicities of axillary/ supraclavicular radiation treatment with the patient.  She  appears to understand and wishes to proceed with postoperative radiation therapy directed at the axilla.   Anticipate 5-1/2 weeks of radiation therapy directed at the right supraclavicular and axillary/operative bed.  Plan:  She was scheduled for CT simulation later today but this has been postponed in light of her swelling issues and nerve pain issues.  She is scheduled for physical therapy evaluation later this week to address swelling issues.  She has been rescheduled for simulation approximately 2 weeks and at that time should be ready to proceed with postoperative radiation therapy.Aaron Aas   -----------------------------------  Noralee Beam, PhD, MD   This document serves as a record of services personally performed by Retta Caster, MD. It was created on his behalf by Lucky Sable, a trained medical scribe. The creation of this record is based on the scribe's personal observations and the provider's statements to them. This document has been checked and approved by the attending provider.

## 2023-10-01 NOTE — Progress Notes (Addendum)
 Location of Breast Cancer:Right    Histology per Pathology Report:    Receptor Status: ER(-), PR (-), Her2-neu (-), Ki-()  Did patient present with symptoms (if so, please note symptoms) or was this found on screening mammography?:    Past/Anticipated interventions by surgeon, if UJW:[id number]    Past/Anticipated interventions by medical oncology, if any:    Lymphedema issues, if any:  Yes right, currently in physical therapy   Pain issues, if any:  Yes,she reports some burning.  Skin issues if any : None, but reports that it feels like it is raw.  SAFETY ISSUES: Prior radiation? No Pacemaker/ICD? No Possible current pregnancy?No Is the patient on methotrexate? No  Current Complaints / other details:  She reports some numbness to her triceps area   BP (!) 144/84 (BP Location: Left Arm, Patient Position: Sitting, Cuff Size: Normal)   Pulse 85   Temp (!) 97.5 F (36.4 C)   Resp 18   Ht 5\' 3"  (1.6 m)   Wt 170 lb 12.8 oz (77.5 kg)   SpO2 97%   BMI 30.26 kg/m

## 2023-10-02 ENCOUNTER — Ambulatory Visit
Admission: RE | Admit: 2023-10-02 | Discharge: 2023-10-02 | Disposition: A | Discharge status: Home / Self Care | Source: Ambulatory Visit | Attending: Radiation Oncology | Admitting: Radiation Oncology

## 2023-10-02 ENCOUNTER — Ambulatory Visit: Admitting: Radiation Oncology

## 2023-10-02 ENCOUNTER — Encounter: Payer: Self-pay | Admitting: Radiation Oncology

## 2023-10-02 VITALS — BP 144/84 | HR 85 | Temp 97.5°F | Resp 18 | Ht 63.0 in | Wt 170.8 lb

## 2023-10-02 DIAGNOSIS — Z85828 Personal history of other malignant neoplasm of skin: Secondary | ICD-10-CM | POA: Insufficient documentation

## 2023-10-02 DIAGNOSIS — Z79899 Other long term (current) drug therapy: Secondary | ICD-10-CM | POA: Insufficient documentation

## 2023-10-02 DIAGNOSIS — Z171 Estrogen receptor negative status [ER-]: Secondary | ICD-10-CM | POA: Diagnosis not present

## 2023-10-02 DIAGNOSIS — C50611 Malignant neoplasm of axillary tail of right female breast: Secondary | ICD-10-CM | POA: Diagnosis present

## 2023-10-02 DIAGNOSIS — M2578 Osteophyte, vertebrae: Secondary | ICD-10-CM | POA: Insufficient documentation

## 2023-10-02 DIAGNOSIS — Z7952 Long term (current) use of systemic steroids: Secondary | ICD-10-CM | POA: Diagnosis not present

## 2023-10-02 DIAGNOSIS — M47812 Spondylosis without myelopathy or radiculopathy, cervical region: Secondary | ICD-10-CM | POA: Insufficient documentation

## 2023-10-02 DIAGNOSIS — C4499 Other specified malignant neoplasm of skin, unspecified: Secondary | ICD-10-CM | POA: Insufficient documentation

## 2023-10-02 NOTE — Therapy (Signed)
 OUTPATIENT PHYSICAL THERAPY BREAST CANCER POST OP FOLLOW UP   Patient Name: Marcia Harvey MRN: [id number] DOB:Jun 18, 1945, 78 y.o., female Today's Date: 10/03/2023  END OF SESSION:  PT End of Session - 10/03/23 1007     Visit Number 2    Number of Visits 8    Date for PT Re-Evaluation 11/14/23    PT Start Time 1007    PT Stop Time 1105    PT Time Calculation (min) 58 min    Activity Tolerance Patient tolerated treatment well    Behavior During Therapy [facility] for tasks assessed/performed             Past Medical History:  Diagnosis Date   Anxiety    Anxiety    Arthritis    Cancer (HCC) 07/2020   SCC chest   Depression    Fall from horse 01/27/12   CT chest/Abd/Pelvis done at [facility]. Findings: Nondisplaced fx of posterior right 11th rib   GERD (gastroesophageal reflux disease)    Hypertension    Seizures (HCC)    been through testing, no issues with seizures or epilepsy   Syncopal episodes    seizure with episode   White matter disease    Past Surgical History:  Procedure Laterality Date   AXILLARY LYMPH NODE DISSECTION Right 09/04/2023   Procedure: Alean Amen;  Surgeon: Sim Dryer, MD;  Location: [facility];  Service: General;  Laterality: Right;  GEN w/PEC BLOCK   BREAST LUMPECTOMY WITH RADIOACTIVE SEED LOCALIZATION Left 06/21/2020   Procedure: LEFT BREAST LUMPECTOMY WITH RADIOACTIVE SEED LOCALIZATION;  Surgeon: Dareen Ebbing, MD;  Location: [facility];  Service: General;  Laterality: Left;   BUNIONECTOMY Bilateral 1978   COLONOSCOPY     EYE SURGERY     MELANOMA EXCISION Right 08/02/2020   Procedure: 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;  Surgeon: Dareen Ebbing, MD;  Location: [facility];  Service: General;  Laterality: Right;  ROOM 8 STARTING AT 04:15PM FOR 45 MIN   SENTINEL NODE BIOPSY Right 09/04/2023   Procedure: BIOPSY, LYMPH NODE, SENTINEL;  Surgeon: Sim Dryer, MD;   Location: [facility];  Service: General;  Laterality: Right;  SEED LOCALIZED RIGHT AXILLARY LYMPHNODE EXCIONSAL BIOPSY   Patient Active Problem List   Diagnosis Date Noted   Recurrent skin cancer 10/02/2023   Genetic testing 08/21/2023   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 There was one very large positive lymph node at 4.8 cm and 6 negative nodes removed. 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 She will undergo axillary radiation. Her main complaint is nerve pain in her upper arm.  PERTINENT HISTORY:  Patient was diagnosed on 07/10/2023 with right axillary node metastatic carcinoma. 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  PATIENT GOALS:  Reassess how my recovery is going related to arm function, pain, and swelling.  PAIN:  Are you having pain? Yes: NPRS scale: 11/10! Pain location: right medial upper arm Pain description: Hypersensitive and touchy Aggravating  factors: Having anything touch the area Relieving factors: Medication and steroid cream  PRECAUTIONS: Recent Surgery, right UE Lymphedema risk  RED FLAGS: None   ACTIVITY LEVEL / LEISURE: She does yardwork and walks her dog    OBJECTIVE:   PATIENT SURVEYS:  QUICK DASH:  Quick Dash - 10/03/23 0001     Open a tight or new jar No difficulty    Do heavy household chores (wash walls, wash floors) Mild difficulty    Carry a shopping bag or briefcase No difficulty    Wash your back No difficulty    Use a knife to cut food No difficulty    Recreational activities in which you take some force or impact through your arm, shoulder, or hand (golf, hammering, tennis) Moderate difficulty    During the past week, to what extent has your arm, shoulder or hand problem interfered with your normal social activities with family, friends, neighbors, or groups? Not at all    During the past week, to what extent has your arm, shoulder or hand problem limited your work or other regular daily activities [place]    Arm, shoulder, or hand pain. Severe    Tingling (pins and needles) in your arm, shoulder, or hand Severe    Difficulty Sleeping Moderate difficulty    DASH Score 29.55 %              OBSERVATIONS: Right axillary incision is healing well with glue still present. Palpable tenderness present around incision with scar tissue palpable. Hypersensitivity and numbness both present medial upper arm. Swelling present on lateral trunk just inferior to axilla.  POSTURE:  Forward head, rounded shoulders  LYMPHEDEMA ASSESSMENT:   UPPER EXTREMITY AROM/PROM:   A/PROM RIGHT   eval   RIGHT 10/03/2023  Shoulder extension 49 48  Shoulder flexion 156 132  Shoulder abduction 162 155  Shoulder internal rotation 63 64  Shoulder external rotation 86 90                          (Blank rows = not tested)   A/PROM LEFT   eval  Shoulder extension 50  Shoulder flexion 145  Shoulder abduction 165   Shoulder internal rotation 77  Shoulder external rotation 83                          (Blank rows = not tested)    UPPER EXTREMITY STRENGTH: WFL   LYMPHEDEMA ASSESSMENTS (in cm):    LANDMARK RIGHT   eval RIGHT 10/03/2023  10 cm proximal to olecranon process 29.4 29  Olecranon process 24.4 24.2  10  cm proximal to ulnar styloid process 23.2 21.7  Just proximal to ulnar styloid process 16.3 16.1  Across hand at thumb web space 18.6 18.7  At base of 2nd digit 6.6 6.5  (Blank rows = not tested)   LANDMARK LEFT   eval LEFT 10/03/2023  10 cm proximal to olecranon process 28.5 28.4  Olecranon process 24.1 24.3  10 cm proximal to ulnar styloid process 21.6 21.7  Just proximal to ulnar styloid process 16 15.7  Across hand at thumb web space 18.5 18.4  At base of 2nd digit 6.4 6.2  (Blank rows = not tested)  Surgery type/Date: 09/04/2023 Number of lymph nodes removed: 7 Current/past treatment (chemo, radiation, hormone therapy): none Other symptoms:  Heaviness/tightness Yes Pain Yes Pitting edema No Infections No Decreased scar mobility Yes Stemmer sign No  PATIENT EDUCATION:  Education details: HEP in supine to avoid neck pain with seated exercises due to poor posture. Educated pt verbally on various desensitization techniques for medial right upper arm. Person educated: Patient Education method: Explanation, Demonstration, Tactile cues, Verbal cues, and Handouts Education comprehension: verbalized understanding, returned demonstration, and verbal cues required  Applied compression foam to area between lateral bra and skin to reduce edema.  HOME EXERCISE PROGRAM: Reviewed previously given post op HEP and revised due to neck pain exercising in sitting.  ASSESSMENT:  CLINICAL IMPRESSION: Patient is doing well overall s/p right axillary lymph node dissection on 09/04/2023. She had 7 nodes removed with 1 being positive for squamous cell carcinoma. It is believed that this is  metastatic from a previous skin cancer on her right shoulder in 2022. She will undergo radiation in the axillary region and will have a high risk for lymphedema. She has significant nerve pain present in her right medial upper arm and very sensitive skin sensation around her scar tissue in that area. She also has limited right shoulder AROM. She will benefit from PT to regain shoulder ROM, reduce nerve pain and skin sensitivity, and be educated further on lymphedema prevention.  Pt will benefit from skilled therapeutic intervention to improve on the following deficits: Decreased knowledge of precautions, impaired UE functional use, pain, decreased ROM, postural dysfunction.   PT treatment/interventions: ADL/Self care home management, [phone]- PT Re-evaluation, 97110-Therapeutic exercises, 97530- Therapeutic activity, [id number]- Neuromuscular re-education, 97535- Self Care, 09811- Manual therapy, Patient/Family education, Dry Needling, Manual lymph drainage, and Scar mobilization   GOALS: Goals reviewed with patient? Yes  LONG TERM GOALS:  (STG=LTG)  GOALS Name Target Date  Goal status  1 Patient will demonstrate proper technique with HEP to regain shoulder ROM while preventing neck pain flare up. 11/14/2023 INITIAL  2 Patient will increase right shoulder active flexion to >/= 150 degrees to reach overhead without difficulty. 11/14/2023 INITIAL  3 Patient will increase right shoulder active abduction to >/= 160 degrees to reach to the side and perform daily tasks without difficulty. 11/14/2023 INITIAL  4 Patient will improve her DASH score to be </= 10 for improved overall UE function. 11/14/2023 INITIAL  5 Patient will report >/= 50% reduction in right upper arm pain to better tolerate wearing shirts and return to normal functional tasks. 11/14/2023      PLAN:  PT FREQUENCY/DURATION: 1x/week for 6 weeks  PLAN FOR NEXT SESSION: review desensitization and see how she's doing with that; AAROM and AROM  shoulder exercises as tolerated; PROM and consider scar massage if she can tolerate.   [facility]  [address], [address]  [place]   [address]  [phone]  After Breast Cancer Class Video It is recommended you view the ABC class video to be educated on lymphedema risk reduction. This video lasts for about 30 minutes. It can be viewed on our website here: [email or web address]  Scar massage You can begin gentle scar massage to you incision sites. Gently place one hand on the incision and move the skin (without sliding on the skin) in various directions. Do this for a few minutes and then you can gently massage either coconut oil or vitamin E cream into the scars. YOU CAN DO THIS ONCE YOU CAN TOLERATE THE PRESSURE OF YOUR HANDS ON YOUR SKIN IN THAT AREA.  Compression garment You should continue wearing your compression bra until you feel like you no longer have swelling.  Home exercise Program Continue doing the exercises you were given until you feel like you can do them without feeling any tightness at the end.   Walking Program Studies show that 30 minutes of walking per day (fast enough to elevate your heart rate) can significantly reduce the risk of a cancer recurrence. If you can't walk due to other medical reasons, we encourage you to find another activity you could do (like a stationary bike or water exercise).  Posture After breast cancer surgery, people frequently sit with rounded shoulders posture because it puts their incisions on slack and feels better. If you sit like this and scar tissue forms in that position, you can become very tight and have pain sitting or standing with good posture. Try to be aware of your posture and sit and stand up tall to heal properly.  Follow up PT: It is recommended you return every 3 months for the first 3 years following surgery to be assessed on the  SOZO machine for an L-Dex score. This helps prevent clinically significant lymphedema in 95% of patients. These follow up screens are 10 minute appointments that you are not billed for.  Rollin Clock, [place] 10/03/23 11:27 AM

## 2023-10-03 ENCOUNTER — Other Ambulatory Visit: Payer: Self-pay

## 2023-10-03 ENCOUNTER — Ambulatory Visit: Attending: Surgery | Admitting: Physical Therapy

## 2023-10-03 ENCOUNTER — Encounter: Payer: Self-pay | Admitting: Physical Therapy

## 2023-10-03 DIAGNOSIS — R293 Abnormal posture: Secondary | ICD-10-CM | POA: Diagnosis present

## 2023-10-03 DIAGNOSIS — Z483 Aftercare following surgery for neoplasm: Secondary | ICD-10-CM | POA: Insufficient documentation

## 2023-10-03 DIAGNOSIS — C773 Secondary and unspecified malignant neoplasm of axilla and upper limb lymph nodes: Secondary | ICD-10-CM | POA: Diagnosis present

## 2023-10-03 NOTE — Patient Instructions (Addendum)
 Cane Exercise: Abduction    DO THIS LAYING FLAT ON YOUR BACK! Hold cane with right hand over end, palm-up, with other hand palm-down. Move arm out from side and up by pushing with other arm. Hold __3__ seconds. Repeat __10__ times. Do __2__ sessions per day.  http://gt2.exer.us /82   Copyright  VHI. All rights reserved.   ALSO - ON YOUR PURPLE EXERCISE SHEET, DO THE FIRST AND LAST EXERCISES LAYING ON YOUR BACK WITH A SMALL PILLOW INSTEAD OF IN SITTING OR STANDING.  [facility]  [address], [address]  [place] [address]  [phone]  After Breast Cancer Class Video It is recommended you view the ABC class video to be educated on lymphedema risk reduction. This video lasts for about 30 minutes. It can be viewed on our website here: [email or web address]  Scar massage You can begin gentle scar massage to you incision sites. Gently place one hand on the incision and move the skin (without sliding on the skin) in various directions. Do this for a few minutes and then you can gently massage either coconut oil or vitamin E cream into the scars. YOU CAN DO THIS ONCE YOU CAN TOLERATE THE PRESSURE OF YOUR HANDS ON YOUR SKIN IN THAT AREA.  Compression garment You should continue wearing your compression bra until you feel like you no longer have swelling.  Home exercise Program Continue doing the exercises you were given until you feel like you can do them without feeling any tightness at the end.   Walking Program Studies show that 30 minutes of walking per day (fast enough to elevate your heart rate) can significantly reduce the risk of a cancer recurrence. If you can't walk due to other medical reasons, we encourage you to find another activity you could do (like a stationary bike or water exercise).  Posture After breast cancer surgery, people frequently sit with rounded shoulders posture because  it puts their incisions on slack and feels better. If you sit like this and scar tissue forms in that position, you can become very tight and have pain sitting or standing with good posture. Try to be aware of your posture and sit and stand up tall to heal properly.  Follow up PT: It is recommended you return every 3 months for the first 3 years following surgery to be assessed on the SOZO machine for an L-Dex score. This helps prevent clinically significant lymphedema in 95% of patients. These follow up screens are 10 minute appointments that you are not billed for.  DESENSITIZATION:  One way to desensitize your skin is by rubbing it with different textures. This will make your skin more tolerant to touch and pressure. Before you begin, make sure your hands and the materials you're using are clean.  To rub your skin with different textures:  Sit in a comfortable position with your skin uncovered. Start with a material that is soft, such as a cotton ball or soft towel. Rub your skin in all directions. Start with a light pressure and gradually increase the pressure. Vary the textures you use as you are able to tolerate them. Start with soft materials like cotton balls or a makeup brush. Progress to materials that are rougher. Examples include a paper towel, cloth towel, or hair brush. As you progress, gradually increase the pressure and roughness of the texture you use. Be careful not to rub over your incision if you still have staples or sutures in place, or if there are any  open areas. Rub your skin for at least 30 seconds as often as you can tolerate, or as recommended by your healthcare provider. I recommend doing this 3 times each day until you no longer have skin sensitivity.

## 2023-10-09 ENCOUNTER — Ambulatory Visit

## 2023-10-09 DIAGNOSIS — Z483 Aftercare following surgery for neoplasm: Secondary | ICD-10-CM

## 2023-10-09 DIAGNOSIS — C773 Secondary and unspecified malignant neoplasm of axilla and upper limb lymph nodes: Secondary | ICD-10-CM | POA: Diagnosis not present

## 2023-10-09 DIAGNOSIS — R293 Abnormal posture: Secondary | ICD-10-CM

## 2023-10-09 NOTE — Therapy (Signed)
 OUTPATIENT PHYSICAL THERAPY BREAST CANCER TREATMENT   Patient Name: Marcia Harvey MRN: [id number] DOB:01-06-1946, 78 y.o., female Today's Date: 10/09/2023  END OF SESSION:  PT End of Session - 10/09/23 1114     Visit Number 3    Number of Visits 8    Date for PT Re-Evaluation 11/14/23    PT Start Time 1108    PT Stop Time 1202    PT Time Calculation (min) 54 min    Activity Tolerance Patient tolerated treatment well    Behavior During Therapy [facility] for tasks assessed/performed             Past Medical History:  Diagnosis Date   Anxiety    Anxiety    Arthritis    Cancer (HCC) 07/2020   SCC chest   Depression    Fall from horse 01/27/12   CT chest/Abd/Pelvis done at [facility]. Findings: Nondisplaced fx of posterior right 11th rib   GERD (gastroesophageal reflux disease)    Hypertension    Seizures (HCC)    been through testing, no issues with seizures or epilepsy   Syncopal episodes    seizure with episode   White matter disease    Past Surgical History:  Procedure Laterality Date   AXILLARY LYMPH NODE DISSECTION Right 09/04/2023   Procedure: Alean Amen;  Surgeon: Sim Dryer, MD;  Location: [facility];  Service: General;  Laterality: Right;  GEN w/PEC BLOCK   BREAST LUMPECTOMY WITH RADIOACTIVE SEED LOCALIZATION Left 06/21/2020   Procedure: LEFT BREAST LUMPECTOMY WITH RADIOACTIVE SEED LOCALIZATION;  Surgeon: Dareen Ebbing, MD;  Location: [facility];  Service: General;  Laterality: Left;   BUNIONECTOMY Bilateral 1978   COLONOSCOPY     EYE SURGERY     MELANOMA EXCISION Right 08/02/2020   Procedure: 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;  Surgeon: Dareen Ebbing, MD;  Location: [facility];  Service: General;  Laterality: Right;  ROOM 8 STARTING AT 04:15PM FOR 45 MIN   SENTINEL NODE BIOPSY Right 09/04/2023   Procedure: BIOPSY, LYMPH NODE, SENTINEL;  Surgeon: Sim Dryer, MD;  Location:  [facility];  Service: General;  Laterality: Right;  SEED LOCALIZED RIGHT AXILLARY LYMPHNODE EXCIONSAL BIOPSY   Patient Active Problem List   Diagnosis Date Noted   Recurrent skin cancer 10/02/2023   Genetic testing 08/21/2023   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 I finally landed with using cotton face pads in my bra and that's helping a little but it still is an area that is really sensitive. The desensitization techniques though are seeming to help. I can tolerate a rougher washcloth now. I have to grit my teeth but I can do it.   PERTINENT HISTORY:  Patient was diagnosed on 07/10/2023 with right axillary node metastatic carcinoma. 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  PATIENT GOALS:  Reassess how my recovery is going related to arm function, pain, and swelling.  PAIN:  Are you having pain? Yes: NPRS scale: 6/10 Pain location: right medial upper arm Pain description: Hypersensitive and touchy Aggravating factors: Having anything touch  the area Relieving factors: Medication and steroid cream, and now desensitization techniques are helping  PRECAUTIONS: Recent Surgery, right UE Lymphedema risk  RED FLAGS: None   ACTIVITY LEVEL / LEISURE: She does yardwork and walks her dog    OBJECTIVE:   PATIENT SURVEYS:  QUICK DASH:     OBSERVATIONS: Right axillary incision is healing well with glue still present. Palpable tenderness present around incision with scar tissue palpable. Hypersensitivity and numbness both present medial upper arm. Swelling present on lateral trunk just inferior to axilla.  POSTURE:  Forward head, rounded shoulders  LYMPHEDEMA ASSESSMENT:   UPPER EXTREMITY AROM/PROM:   A/PROM RIGHT   eval   RIGHT 10/03/2023  Shoulder extension 49 48  Shoulder flexion 156 132  Shoulder abduction 162 155  Shoulder internal rotation 63 64  Shoulder external rotation 86 90                          (Blank rows = not tested)   A/PROM LEFT   eval  Shoulder extension 50  Shoulder flexion 145  Shoulder abduction 165  Shoulder internal rotation 77  Shoulder external rotation 83                          (Blank rows = not tested)    UPPER EXTREMITY STRENGTH: WFL   LYMPHEDEMA ASSESSMENTS (in cm):    LANDMARK RIGHT   eval RIGHT 10/03/2023  10 cm proximal to olecranon process 29.4 29  Olecranon process 24.4 24.2  10 cm proximal to ulnar styloid process 23.2 21.7  Just proximal to ulnar styloid process 16.3 16.1  Across hand at thumb web space 18.6 18.7  At base of 2nd digit 6.6 6.5  (Blank rows = not tested)   LANDMARK LEFT   eval LEFT 10/03/2023  10 cm proximal to olecranon process 28.5 28.4  Olecranon process 24.1 24.3  10 cm proximal to ulnar styloid process 21.6 21.7  Just proximal to ulnar styloid process 16 15.7  Across hand at thumb web space 18.5 18.4  At base of 2nd digit 6.4 6.2  (Blank rows = not tested)  Surgery type/Date: 09/04/2023 Number of lymph nodes removed: 7 Current/past  treatment (chemo, radiation, hormone therapy): none Other symptoms:  Heaviness/tightness Yes Pain Yes Pitting edema No Infections No Decreased scar mobility Yes Stemmer sign No  TODAY'S TREATMENT 10/09/23: Therapeutic Exercises Pulleys into flex and abd x 2:30 mins each returning therapist demo and tactile cues to decrease Rt scapular compensation Roll yellow ball up wall into flex and Rt abd x  10 each returning demo. Pt reports stretches feeling very good.  Manual Therapy Made chip pack and issued this for pt to wear at edge of bra where she reports feeling most tenderness. She reports this very comfortable upon placing.  P/ROM in supine to Rt shoulder into flex, abd, scaption and D2, also neural tension stretch and demonstrated this to pt on wall for no longer than 5 sec, 3-5 reps MFR to possible cording in antecubital fossa, and to axilla STM to Rt axilla for scar tissue release and desensitization, pt tolerated pressure very well today with just mild tenderness reported; also to medial scap border when in S/L Scap Mobs in Lt S/L into retraction and protraction   PATIENT EDUCATION:  Education details: HEP in supine to avoid neck pain with seated exercises due to poor posture. Educated pt verbally on various desensitization techniques for medial right upper arm. Person educated: Patient Education method: Explanation, Demonstration, Tactile cues, Verbal cues, and Handouts Education comprehension: verbalized understanding, returned demonstration, and verbal cues required  Applied compression foam to area between lateral bra and skin to reduce edema.  HOME EXERCISE PROGRAM: Reviewed previously given post op HEP and revised due to neck pain exercising in sitting.  ASSESSMENT:  CLINICAL IMPRESSION: Today began AA/ROM stretches which pt tolerated very well reporting feeling good stretches with these. During these stretches began educating her on importance of postural awareness and  scapular compensation during A/ROM of Rt shoulder. Also explained brachial plexus and how this lies over lymph node bed and that ALND causes nerve symptoms due to this and that the stretches help to improve all that was flared up from surgery. Pt able to verbalize good understanding. Then first session of manual therapy working to decrease Rt UE tightness and nerve desensitization techniques. Also made chip pack for pt to wear at edge of bra where most irritation reported. She reports an immediate improvement.  Pt will benefit from skilled therapeutic intervention to improve on the following deficits: Decreased knowledge of precautions, impaired UE functional use, pain, decreased ROM, postural dysfunction.   PT treatment/interventions: ADL/Self care home management, [phone]- PT Re-evaluation, 97110-Therapeutic exercises, 97530- Therapeutic activity, [id number]- Neuromuscular re-education, 97535- Self Care, [id number]- Manual therapy, Patient/Family education, Dry Needling, Manual lymph drainage, and Scar mobilization   GOALS: Goals reviewed with patient? Yes  LONG TERM GOALS:  (STG=LTG)  GOALS Name Target Date  Goal status  1 Patient will demonstrate proper technique with HEP to regain shoulder ROM while preventing neck pain flare up. 11/14/2023 INITIAL  2 Patient will increase right shoulder active flexion to >/= 150 degrees to reach overhead without difficulty. 11/14/2023 INITIAL  3 Patient will increase right shoulder active abduction to >/= 160 degrees to reach to the side and perform daily tasks without difficulty. 11/14/2023 INITIAL  4 Patient will improve her DASH score to be </= 10 for improved overall UE function. 11/14/2023 INITIAL  5 Patient will report >/= 50% reduction in right upper arm pain to better tolerate wearing shirts and return to normal functional tasks. 11/14/2023      PLAN:  PT FREQUENCY/DURATION: 1x/week for 6 weeks  PLAN FOR NEXT SESSION: Cont desensitization; how is chip pack?;Cont  AAROM and AROM shoulder exercises as tolerated; PROM and consider scar massage if she can tolerate.   Roslynn Coombes, PTA 10/09/23 1:16 PM   [facility]  [address], [address]  [place] [address]  [phone]

## 2023-10-16 ENCOUNTER — Ambulatory Visit: Attending: Surgery

## 2023-10-16 DIAGNOSIS — Z483 Aftercare following surgery for neoplasm: Secondary | ICD-10-CM | POA: Diagnosis present

## 2023-10-16 DIAGNOSIS — R293 Abnormal posture: Secondary | ICD-10-CM | POA: Insufficient documentation

## 2023-10-16 DIAGNOSIS — C773 Secondary and unspecified malignant neoplasm of axilla and upper limb lymph nodes: Secondary | ICD-10-CM | POA: Diagnosis present

## 2023-10-16 NOTE — Therapy (Signed)
 OUTPATIENT PHYSICAL THERAPY BREAST CANCER TREATMENT   Patient Name: Marcia Harvey MRN: [id number] DOB:[birth year], 78 y.o., female Today's Date: 10/16/2023  END OF SESSION:  PT End of Session - 10/16/23 1212     Visit Number 4    Number of Visits 8    Date for PT Re-Evaluation 11/14/23    PT Start Time 1208    PT Stop Time 1303    PT Time Calculation (min) 55 min    Activity Tolerance Patient tolerated treatment well    Behavior During Therapy [facility] for tasks assessed/performed             Past Medical History:  Diagnosis Date   Anxiety    Anxiety    Arthritis    Cancer (HCC) 07/2020   SCC chest   Depression    Fall from horse 01/27/12   CT chest/Abd/Pelvis done at [facility]. Findings: Nondisplaced fx of posterior right 11th rib   GERD (gastroesophageal reflux disease)    Hypertension    Seizures (HCC)    been through testing, no issues with seizures or epilepsy   Syncopal episodes    seizure with episode   White matter disease    Past Surgical History:  Procedure Laterality Date   AXILLARY LYMPH NODE DISSECTION Right 09/04/2023   Procedure: Alean Amen;  Surgeon: Sim Dryer, MD;  Location: [facility];  Service: General;  Laterality: Right;  GEN w/PEC BLOCK   BREAST LUMPECTOMY WITH RADIOACTIVE SEED LOCALIZATION Left 06/21/2020   Procedure: LEFT BREAST LUMPECTOMY WITH RADIOACTIVE SEED LOCALIZATION;  Surgeon: Dareen Ebbing, MD;  Location: [facility];  Service: General;  Laterality: Left;   BUNIONECTOMY Bilateral 1978   COLONOSCOPY     EYE SURGERY     MELANOMA EXCISION Right 08/02/2020   Procedure: 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;  Surgeon: Dareen Ebbing, MD;  Location: [facility];  Service: General;  Laterality: Right;  ROOM 8 STARTING AT 04:15PM FOR 45 MIN   SENTINEL NODE BIOPSY Right 09/04/2023   Procedure: BIOPSY, LYMPH NODE, SENTINEL;  Surgeon: Sim Dryer, MD;  Location:  [facility];  Service: General;  Laterality: Right;  SEED LOCALIZED RIGHT AXILLARY LYMPHNODE EXCIONSAL BIOPSY   Patient Active Problem List   Diagnosis Date Noted   Recurrent skin cancer 10/02/2023   Genetic testing 08/21/2023   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 But the sensitivity is so much better! I've been stretching, massaging and working on being more aware of my posture.   PERTINENT HISTORY:  Patient was diagnosed on 07/10/2023 with right axillary node metastatic carcinoma. 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  PATIENT GOALS:  Reassess how my recovery is going related to arm function, pain, and swelling.  PAIN:  Are you having pain? Yes: NPRS scale: 1/10 Pain location: right medial upper arm Pain description: hypersensitivity but less so Aggravating factors: not much now bc it's getting better Relieving factors: the physical therapy techniques: desensitization, stretching  and postural awareness  PRECAUTIONS: Recent Surgery, right UE Lymphedema risk  RED FLAGS: None   ACTIVITY LEVEL / LEISURE: She does yardwork and walks her dog    OBJECTIVE:   PATIENT SURVEYS:  QUICK DASH:     OBSERVATIONS: Right axillary incision is healing well with glue still present. Palpable tenderness present around incision with scar tissue palpable. Hypersensitivity and numbness both present medial upper arm. Swelling present on lateral trunk just inferior to axilla.  POSTURE:  Forward head, rounded shoulders  LYMPHEDEMA ASSESSMENT:   UPPER EXTREMITY AROM/PROM:   A/PROM RIGHT   eval   RIGHT 10/03/2023  Shoulder extension 49 48  Shoulder flexion 156 132  Shoulder abduction 162 155  Shoulder internal rotation 63 64  Shoulder external rotation 86 90                          (Blank rows = not tested)   A/PROM LEFT   eval  Shoulder extension 50  Shoulder flexion 145  Shoulder abduction 165  Shoulder internal rotation 77  Shoulder external rotation 83                          (Blank rows = not tested)    UPPER EXTREMITY STRENGTH: WFL   LYMPHEDEMA ASSESSMENTS (in cm):    LANDMARK RIGHT   eval RIGHT 10/03/2023  10 cm proximal to olecranon process 29.4 29  Olecranon process 24.4 24.2  10 cm proximal to ulnar styloid process 23.2 21.7  Just proximal to ulnar styloid process 16.3 16.1  Across hand at thumb web space 18.6 18.7  At base of 2nd digit 6.6 6.5  (Blank rows = not tested)   LANDMARK LEFT   eval LEFT 10/03/2023  10 cm proximal to olecranon process 28.5 28.4  Olecranon process 24.1 24.3  10 cm proximal to ulnar styloid process 21.6 21.7  Just proximal to ulnar styloid process 16 15.7  Across hand at thumb web space 18.5 18.4  At base of 2nd digit 6.4 6.2  (Blank rows = not tested)  Surgery type/Date: 09/04/2023 Number of lymph nodes removed: 7 Current/past treatment (chemo, radiation, hormone therapy): none Other symptoms:   Heaviness/tightness Yes Pain Yes Pitting edema No Infections No Decreased scar mobility Yes Stemmer sign No  TODAY'S TREATMENT 10/16/23: Therapeutic Exercises Pulleys into flex x 2 mins and abd x 3 mins returning therapist demo Roll yellow ball up wall into flex and then Rt UE abd x 10 each Modified downward dog on wall x 5 reps, 5 sec holds Therapeutic Activities Supine over half foam roll for  improved A/ROM and posture: Bil UE horz abd, bil UE scaption into a "V" x 10 each, then bil UE abd in a "snow angel" x 7 reps, 5 sec holds.  Manual Therapy P/ROM in supine to Rt shoulder into flex, abd and D2 with scapular depression throughout STM to Rt axilla for scar tissue release and lateral trunk where mild tightness palpable, also to medial scap border when in S/L, pt with some tenderness here but much improved at medial arm Scap Mobs in Lt S/L into retraction and protraction  10/09/23: Therapeutic Exercises Pulleys into flex and abd x 2:30 mins each returning therapist demo and tactile cues to decrease Rt scapular compensation Roll yellow ball up wall into flex and Rt abd x 10 each returning demo. Pt reports stretches feeling very good.  Manual Therapy Made chip pack and issued this for pt to wear at edge of bra where she reports feeling most tenderness. She reports this very comfortable upon placing.  P/ROM in supine to Rt shoulder into flex, abd, scaption and D2, also neural tension stretch and demonstrated this to pt on wall for no longer than 5 sec, 3-5 reps MFR to possible cording in antecubital fossa, and to axilla STM to Rt axilla for scar tissue release and desensitization, pt tolerated pressure very well today with just mild tenderness reported; also to medial scap border when in S/L Scap Mobs in Lt S/L into retraction and protraction   PATIENT EDUCATION:  Education details: HEP in supine to avoid neck pain with seated exercises due to poor posture. Educated pt verbally on  various desensitization techniques for medial right upper arm. Person educated: Patient Education method: Explanation, Demonstration, Tactile cues, Verbal cues, and Handouts Education comprehension: verbalized understanding, returned demonstration, and verbal cues required  Applied compression foam to area between lateral bra and skin to reduce edema.  HOME EXERCISE PROGRAM: Reviewed previously given post op HEP and revised due to neck pain exercising in sitting.  ASSESSMENT:  CLINICAL IMPRESSION: Pt comes in reporting feeling much improvement noted since start of care. She does still report feeling sensitivity at medial upper arm but in a smaller radius than before and not hypersensitive now, more tender to touch. Progressed A/ROM stretches to also help reduce postural changes by stretching over foam roll. Pt reports feeling excellent stretches with these so educated her on how she can replicate this at home over rolled yoga mat thickened with towels or can order foam roll off [employer]. Much improvement also noted with manual therapy as her tolerance to pressure is much improved. She has a small area at Rt lower back that she wanted me to assess. Appears to be a black head but since she's had a skin caner spot removed before advised her it would be smart to update her dermatologist. Pt verbalized understanding.   Pt will benefit from skilled therapeutic intervention to improve on the following deficits: Decreased knowledge of precautions, impaired UE functional use, pain, decreased ROM, postural dysfunction.   PT treatment/interventions: ADL/Self care home management, [phone]- PT Re-evaluation, 97110-Therapeutic exercises, 97530- Therapeutic activity, [id number]- Neuromuscular re-education, 97535- Self Care, 95621- Manual therapy, Patient/Family education, Dry Needling, Manual lymph drainage, and Scar mobilization   GOALS: Goals reviewed with patient? Yes  LONG TERM GOALS:  (STG=LTG)  GOALS Name  Target Date  Goal status  1 Patient will demonstrate proper technique with HEP to regain shoulder ROM while preventing neck pain flare up. 11/14/2023 INITIAL  2 Patient will increase right shoulder active flexion  to >/= 150 degrees to reach overhead without difficulty. 11/14/2023 INITIAL  3 Patient will increase right shoulder active abduction to >/= 160 degrees to reach to the side and perform daily tasks without difficulty. 11/14/2023 INITIAL  4 Patient will improve her DASH score to be </= 10 for improved overall UE function. 11/14/2023 INITIAL  5 Patient will report >/= 50% reduction in right upper arm pain to better tolerate wearing shirts and return to normal functional tasks. 11/14/2023      PLAN:  PT FREQUENCY/DURATION: 1x/week for 6 weeks  PLAN FOR NEXT SESSION: Cont desensitization prn; Cont AAROM and AROM shoulder exercises as tolerated; PROM and consider scar massage if she can tolerate. Add supine scapular series.    Roslynn Coombes, PTA 10/16/23 1:19 PM   [facility]  [address]  [place] [address]  [phone]

## 2023-10-21 ENCOUNTER — Ambulatory Visit
Admission: RE | Admit: 2023-10-21 | Discharge: 2023-10-21 | Disposition: A | Discharge status: Home / Self Care | Source: Ambulatory Visit | Attending: Radiation Oncology | Admitting: Radiation Oncology

## 2023-10-21 DIAGNOSIS — Z510 Encounter for antineoplastic radiation therapy: Secondary | ICD-10-CM | POA: Diagnosis not present

## 2023-10-21 DIAGNOSIS — C773 Secondary and unspecified malignant neoplasm of axilla and upper limb lymph nodes: Secondary | ICD-10-CM | POA: Insufficient documentation

## 2023-10-21 DIAGNOSIS — C50611 Malignant neoplasm of axillary tail of right female breast: Secondary | ICD-10-CM | POA: Insufficient documentation

## 2023-10-21 DIAGNOSIS — C4492 Squamous cell carcinoma of skin, unspecified: Secondary | ICD-10-CM | POA: Diagnosis not present

## 2023-10-21 DIAGNOSIS — Z171 Estrogen receptor negative status [ER-]: Secondary | ICD-10-CM | POA: Insufficient documentation

## 2023-10-21 DIAGNOSIS — C4499 Other specified malignant neoplasm of skin, unspecified: Secondary | ICD-10-CM | POA: Insufficient documentation

## 2023-10-23 ENCOUNTER — Encounter: Payer: Self-pay | Admitting: Rehabilitation

## 2023-10-23 ENCOUNTER — Ambulatory Visit: Admitting: Rehabilitation

## 2023-10-23 DIAGNOSIS — C773 Secondary and unspecified malignant neoplasm of axilla and upper limb lymph nodes: Secondary | ICD-10-CM | POA: Diagnosis not present

## 2023-10-23 DIAGNOSIS — Z483 Aftercare following surgery for neoplasm: Secondary | ICD-10-CM

## 2023-10-23 DIAGNOSIS — R293 Abnormal posture: Secondary | ICD-10-CM

## 2023-10-23 NOTE — Therapy (Addendum)
 OUTPATIENT PHYSICAL THERAPY BREAST CANCER TREATMENT   Patient Name: Marcia Harvey MRN: [id number] DOB:1946-04-14, 78 y.o., female Today's Date: 10/23/2023  END OF SESSION:  PT End of Session - 10/23/23 1047     Visit Number 5    Number of Visits 8    Date for PT Re-Evaluation 11/14/23    PT Start Time 1055    PT Stop Time 1150    PT Time Calculation (min) 55 min    Activity Tolerance Patient tolerated treatment well    Behavior During Therapy [facility] for tasks assessed/performed             Past Medical History:  Diagnosis Date   Anxiety    Anxiety    Arthritis    Cancer (HCC) 07/2020   SCC chest   Depression    Fall from horse 01/27/12   CT chest/Abd/Pelvis done at [facility]. Findings: Nondisplaced fx of posterior right 11th rib   GERD (gastroesophageal reflux disease)    Hypertension    Seizures (HCC)    been through testing, no issues with seizures or epilepsy   Syncopal episodes    seizure with episode   White matter disease    Past Surgical History:  Procedure Laterality Date   AXILLARY LYMPH NODE DISSECTION Right 09/04/2023   Procedure: Alean Amen;  Surgeon: Sim Dryer, MD;  Location: [facility];  Service: General;  Laterality: Right;  GEN w/PEC BLOCK   BREAST LUMPECTOMY WITH RADIOACTIVE SEED LOCALIZATION Left 06/21/2020   Procedure: LEFT BREAST LUMPECTOMY WITH RADIOACTIVE SEED LOCALIZATION;  Surgeon: Dareen Ebbing, MD;  Location: [facility];  Service: General;  Laterality: Left;   BUNIONECTOMY Bilateral 1978   COLONOSCOPY     EYE SURGERY     MELANOMA EXCISION Right 08/02/2020   Procedure: 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;  Surgeon: Dareen Ebbing, MD;  Location: [facility];  Service: General;  Laterality: Right;  ROOM 8 STARTING AT 04:15PM FOR 45 MIN   SENTINEL NODE BIOPSY Right 09/04/2023   Procedure: BIOPSY, LYMPH NODE, SENTINEL;  Surgeon: Sim Dryer, MD;  Location:  [facility];  Service: General;  Laterality: Right;  SEED LOCALIZED RIGHT AXILLARY LYMPHNODE EXCIONSAL BIOPSY   Patient Active Problem List   Diagnosis Date Noted   Recurrent skin cancer 10/02/2023   Genetic testing 08/21/2023   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  It is more in the armpit.  I did have the mould made on Monday so it could be sore from that.    PERTINENT HISTORY:  Patient was diagnosed on 07/10/2023 with right axillary node metastatic carcinoma. 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  PATIENT GOALS:  Reassess how my recovery is going related to arm function, pain, and swelling.  PAIN:  Are you having pain? Yes: NPRS scale: 1/10 Pain location: right medial upper arm Pain description: hypersensitivity but less so Aggravating factors: not much now bc it's getting better Relieving factors: the physical therapy techniques: desensitization, stretching and postural awareness  PRECAUTIONS: Recent Surgery, right UE Lymphedema  risk  RED FLAGS: None   ACTIVITY LEVEL / LEISURE: She does yardwork and walks her dog    OBJECTIVE:   PATIENT SURVEYS:  QUICK DASH:     OBSERVATIONS: Right axillary incision is healing well with glue still present. Palpable tenderness present around incision with scar tissue palpable. Hypersensitivity and numbness both present medial upper arm. Swelling present on lateral trunk just inferior to axilla.  POSTURE:  Forward head, rounded shoulders  LYMPHEDEMA ASSESSMENT:   UPPER EXTREMITY AROM/PROM:   A/PROM RIGHT   eval   RIGHT 10/03/2023 10/23/23  Shoulder extension 49 48 55  Shoulder flexion 156 132 156  Shoulder abduction 162 155 165  Shoulder internal rotation 63 64 80  Shoulder external rotation 86 90 95                          (Blank rows = not tested)   A/PROM LEFT   eval  Shoulder extension 50  Shoulder flexion 145  Shoulder abduction 165  Shoulder internal rotation 77  Shoulder external rotation 83                          (Blank rows = not tested)    UPPER EXTREMITY STRENGTH: WFL   LYMPHEDEMA ASSESSMENTS (in cm):    LANDMARK RIGHT   eval RIGHT 10/03/2023  10 cm proximal to olecranon process 29.4 29  Olecranon process 24.4 24.2  10 cm proximal to ulnar styloid process 23.2 21.7  Just proximal to ulnar styloid process 16.3 16.1  Across hand at thumb web space 18.6 18.7  At base of 2nd digit 6.6 6.5  (Blank rows = not tested)   LANDMARK LEFT   eval LEFT 10/03/2023  10 cm proximal to olecranon process 28.5 28.4  Olecranon process 24.1 24.3  10 cm proximal to ulnar styloid process 21.6 21.7  Just proximal to ulnar styloid process 16 15.7  Across hand at thumb web space 18.5 18.4  At base of 2nd digit 6.4 6.2  (Blank rows = not tested)  Surgery type/Date: 09/04/2023 Number of lymph nodes removed: 7 Current/past treatment (chemo, radiation, hormone therapy): none Other symptoms:  Heaviness/tightness Yes Pain Yes Pitting edema No Infections  No Decreased scar mobility Yes Stemmer sign No  TODAY'S TREATMENT 10/23/23 Rechecked AROM - goals met Therapeutic Exercises Pulleys into flex x 2 mins and abd x 3 mins returning therapist demo Roll yellow ball up wall into flex and then Rt UE abd x 10 each Modified downward dog on wall x 5 reps, 5 sec holds Therapeutic Activities Supine over  half foam roll for improved A/ROM and posture: prolonged chest stretch x 60 Bil UE horz abd, alternating flexion x 5bil, bil UE scaption into a V x 10 each, then bil UE abd in a snow angel x 7 reps, 5 sec holds.  Manual Therapy P/ROM in supine to Rt shoulder into flex, abd and D2 with scapular depression throughout STM to Rt axilla for scar tissue release and lateral trunk where mild tightness palpable  10/16/23: Therapeutic Exercises Pulleys into flex x 2 mins and abd x 3 mins returning therapist demo Roll yellow ball up wall into flex and then Rt UE abd x 10 each Modified downward dog on wall x 5 reps, 5 sec holds Therapeutic Activities Supine over half foam roll for improved A/ROM and posture: Bil UE horz abd, bil UE scaption into a V x 10 each, then bil UE abd in a snow angel x 7 reps, 5 sec holds.  Manual Therapy P/ROM in supine to Rt shoulder into flex, abd and D2 with scapular depression throughout STM to Rt axilla for scar tissue release and lateral trunk where mild tightness palpable, also to medial scap border when in S/L, pt with some tenderness here but much improved at medial arm Scap Mobs in Lt S/L into retraction and protraction  10/09/23: Therapeutic Exercises Pulleys into flex and abd x 2:30 mins each returning therapist demo and tactile cues to decrease Rt scapular compensation Roll yellow ball up wall into flex and Rt abd x 10 each returning demo. Pt reports stretches feeling very good.  Manual Therapy Made chip pack and issued this for pt to wear at edge of bra where she reports feeling most tenderness. She reports this  very comfortable upon placing.  P/ROM in supine to Rt shoulder into flex, abd, scaption and D2, also neural tension stretch and demonstrated this to pt on wall for no longer than 5 sec, 3-5 reps MFR to possible cording in antecubital fossa, and to axilla STM to Rt axilla for scar tissue release and desensitization, pt tolerated pressure very well today with just mild tenderness reported; also to medial scap border when in S/L Scap Mobs in Lt S/L into retraction and protraction   PATIENT EDUCATION:  Education details: HEP in supine to avoid neck pain with seated exercises due to poor posture. Educated pt verbally on various desensitization techniques for medial right upper arm. Person educated: Patient Education method: Explanation, Demonstration, Tactile cues, Verbal cues, and Handouts Education comprehension: verbalized understanding, returned demonstration, and verbal cues required  Applied compression foam to area between lateral bra and skin to reduce edema.  HOME EXERCISE PROGRAM: Reviewed previously given post op HEP and revised due to neck pain exercising in sitting.  ASSESSMENT:  CLINICAL IMPRESSION:  Pt has now met her ROM goal and is feeling well overall.  She would like to plan on one more visit to finalize plan.    Pt will benefit from skilled therapeutic intervention to improve on the following deficits: Decreased knowledge of precautions, impaired UE functional use, pain, decreased ROM, postural dysfunction.   PT treatment/interventions: ADL/Self care home management, [phone]- PT Re-evaluation, 97110-Therapeutic exercises, 97530- Therapeutic activity, [id number]- Neuromuscular re-education, 97535- Self Care, [id number]- Manual therapy, Patient/Family education, Dry Needling, Manual lymph drainage, and Scar mobilization   GOALS: Goals reviewed with patient? Yes  LONG TERM GOALS:  (STG=LTG)  GOALS Name Target Date  Goal status  1 Patient will demonstrate proper technique with HEP  to regain shoulder ROM while preventing neck  pain flare up. 11/14/2023 INITIAL  2 Patient will increase right shoulder active flexion to >/= 150 degrees to reach overhead without difficulty. 11/14/2023 MET  3 Patient will increase right shoulder active abduction to >/= 160 degrees to reach to the side and perform daily tasks without difficulty. 11/14/2023 MET  4 Patient will improve her DASH score to be </= 10 for improved overall UE function. 11/14/2023 INITIAL  5 Patient will report >/= 50% reduction in right upper arm pain to better tolerate wearing shirts and return to normal functional tasks. 11/14/2023 MET     PLAN:  PT FREQUENCY/DURATION: 1x/week for 6 weeks  PLAN FOR NEXT SESSION: Cont desensitization prn; Cont AAROM and AROM shoulder exercises as tolerated; PROM and consider scar massage if she can tolerate. Add supine scapular series.    Judy Null, PT  10/23/23 2:03 PM   [facility]  [address]  [place] [address]  [phone]

## 2023-10-24 DIAGNOSIS — Z510 Encounter for antineoplastic radiation therapy: Secondary | ICD-10-CM | POA: Diagnosis not present

## 2023-10-28 ENCOUNTER — Other Ambulatory Visit: Payer: Self-pay

## 2023-10-28 ENCOUNTER — Ambulatory Visit
Admission: RE | Admit: 2023-10-28 | Discharge: 2023-10-28 | Disposition: A | Discharge status: Home / Self Care | Source: Ambulatory Visit | Attending: Radiation Oncology | Admitting: Radiation Oncology

## 2023-10-28 DIAGNOSIS — C50611 Malignant neoplasm of axillary tail of right female breast: Secondary | ICD-10-CM

## 2023-10-28 DIAGNOSIS — C4499 Other specified malignant neoplasm of skin, unspecified: Secondary | ICD-10-CM

## 2023-10-28 DIAGNOSIS — Z510 Encounter for antineoplastic radiation therapy: Secondary | ICD-10-CM | POA: Diagnosis not present

## 2023-10-28 LAB — RAD ONC ARIA SESSION SUMMARY
Course Elapsed Days: 0
Plan Fractions Treated to Date: 1
Plan Prescribed Dose Per Fraction: 1.8 Gy
Plan Total Fractions Prescribed: 28
Plan Total Prescribed Dose: 50.4 Gy
Reference Point Dosage Given to Date: 1.8 Gy
Reference Point Session Dosage Given: 1.8 Gy
Session Number: 1

## 2023-10-28 MED ORDER — RADIAPLEXRX EX GEL: Freq: Once | CUTANEOUS | Status: AC

## 2023-10-28 MED ORDER — ALRA NON-METALLIC DEODORANT (RAD-ONC)
1.0000 | Freq: Once | TOPICAL | Status: AC
  Administered 2023-10-28: 1 via TOPICAL

## 2023-10-29 ENCOUNTER — Ambulatory Visit
Admission: RE | Admit: 2023-10-29 | Discharge: 2023-10-29 | Disposition: A | Discharge status: Home / Self Care | Source: Ambulatory Visit | Attending: Radiation Oncology | Admitting: Radiation Oncology

## 2023-10-29 ENCOUNTER — Other Ambulatory Visit: Payer: Self-pay

## 2023-10-29 DIAGNOSIS — Z510 Encounter for antineoplastic radiation therapy: Secondary | ICD-10-CM | POA: Diagnosis not present

## 2023-10-29 LAB — RAD ONC ARIA SESSION SUMMARY
Course Elapsed Days: 1
Plan Fractions Treated to Date: 2
Plan Prescribed Dose Per Fraction: 1.8 Gy
Plan Total Fractions Prescribed: 28
Plan Total Prescribed Dose: 50.4 Gy
Reference Point Dosage Given to Date: 3.6 Gy
Reference Point Session Dosage Given: 1.8 Gy
Session Number: 2

## 2023-10-30 ENCOUNTER — Ambulatory Visit

## 2023-10-30 ENCOUNTER — Other Ambulatory Visit: Payer: Self-pay

## 2023-10-30 ENCOUNTER — Ambulatory Visit
Admission: RE | Admit: 2023-10-30 | Discharge: 2023-10-30 | Disposition: A | Discharge status: Home / Self Care | Source: Ambulatory Visit | Attending: Radiation Oncology | Admitting: Radiation Oncology

## 2023-10-30 DIAGNOSIS — C773 Secondary and unspecified malignant neoplasm of axilla and upper limb lymph nodes: Secondary | ICD-10-CM | POA: Diagnosis not present

## 2023-10-30 DIAGNOSIS — Z483 Aftercare following surgery for neoplasm: Secondary | ICD-10-CM

## 2023-10-30 DIAGNOSIS — R293 Abnormal posture: Secondary | ICD-10-CM

## 2023-10-30 DIAGNOSIS — Z510 Encounter for antineoplastic radiation therapy: Secondary | ICD-10-CM | POA: Diagnosis not present

## 2023-10-30 LAB — RAD ONC ARIA SESSION SUMMARY
Course Elapsed Days: 2
Plan Fractions Treated to Date: 3
Plan Prescribed Dose Per Fraction: 1.8 Gy
Plan Total Fractions Prescribed: 28
Plan Total Prescribed Dose: 50.4 Gy
Reference Point Dosage Given to Date: 5.4 Gy
Reference Point Session Dosage Given: 1.8 Gy
Session Number: 3

## 2023-10-30 NOTE — Therapy (Signed)
 OUTPATIENT PHYSICAL THERAPY BREAST CANCER TREATMENT   Patient Name: Marcia Harvey MRN: [id number] DOB:[birth year], 78 y.o., female Today's Date: 10/30/2023  END OF SESSION:  PT End of Session - 10/30/23 1209     Visit Number 6    Number of Visits 8    Date for PT Re-Evaluation 11/14/23    PT Start Time 1205    PT Stop Time 1302    PT Time Calculation (min) 57 min    Activity Tolerance Patient tolerated treatment well    Behavior During Therapy [facility] for tasks assessed/performed          Past Medical History:  Diagnosis Date   Anxiety    Anxiety    Arthritis    Cancer (HCC) 07/2020   SCC chest   Depression    Fall from horse 01/27/12   CT chest/Abd/Pelvis done at [facility]. Findings: Nondisplaced fx of posterior right 11th rib   GERD (gastroesophageal reflux disease)    Hypertension    Seizures (HCC)    been through testing, no issues with seizures or epilepsy   Syncopal episodes    seizure with episode   White matter disease    Past Surgical History:  Procedure Laterality Date   AXILLARY LYMPH NODE DISSECTION Right 09/04/2023   Procedure: Alean Amen;  Surgeon: Sim Dryer, MD;  Location: [facility];  Service: General;  Laterality: Right;  GEN w/PEC BLOCK   BREAST LUMPECTOMY WITH RADIOACTIVE SEED LOCALIZATION Left 06/21/2020   Procedure: LEFT BREAST LUMPECTOMY WITH RADIOACTIVE SEED LOCALIZATION;  Surgeon: Dareen Ebbing, MD;  Location: [facility];  Service: General;  Laterality: Left;   BUNIONECTOMY Bilateral 1978   COLONOSCOPY     EYE SURGERY     MELANOMA EXCISION Right 08/02/2020   Procedure: 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;  Surgeon: Dareen Ebbing, MD;  Location: [facility];  Service: General;  Laterality: Right;  ROOM 8 STARTING AT 04:15PM FOR 45 MIN   SENTINEL NODE BIOPSY Right 09/04/2023   Procedure: BIOPSY, LYMPH NODE, SENTINEL;  Surgeon: Sim Dryer, MD;  Location: [facility];  Service: General;  Laterality: Right;  SEED LOCALIZED RIGHT AXILLARY LYMPHNODE EXCIONSAL BIOPSY   Patient Active Problem List   Diagnosis Date Noted   Recurrent skin cancer 10/02/2023   Genetic testing 08/21/2023   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 My soft foam roll arrives today so I am happy to have that to stretch with during radiation.   PERTINENT HISTORY:  Patient was diagnosed on 07/10/2023 with right axillary node metastatic carcinoma. 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  PATIENT GOALS:  Reassess how my recovery is going related to arm function, pain, and swelling.  PAIN:  Are you having pain? No  PRECAUTIONS: Recent Surgery, right UE Lymphedema risk  RED FLAGS: None   ACTIVITY LEVEL / LEISURE: She does yardwork and walks her dog    OBJECTIVE:   PATIENT SURVEYS:  QUICK DASH:  Quick Dash - 10/30/23 0001     Open a tight or new jar No difficulty    Do heavy household chores (wash walls, wash  floors) No difficulty    Carry a shopping bag or briefcase No difficulty    Wash your back No difficulty    Use a knife to cut food No difficulty    Recreational activities in which you take some force or impact through your arm, shoulder, or hand (golf, hammering, tennis) No difficulty    During the past week, to what extent has your arm, shoulder or hand problem interfered with your normal social activities with family, friends, neighbors, or groups? Not at all    During the past week, to what extent has your arm, shoulder or hand problem limited your work or other regular daily activities Not at all    Arm, shoulder, or hand pain. Mild    Tingling (pins and needles) in your arm, shoulder, or hand None    Difficulty Sleeping No difficulty    DASH Score 2.27 %        Eval: 29.55 % 10/30/23: 2.27 %    OBSERVATIONS: Right axillary incision is healing well with glue still present. Palpable tenderness present around incision with scar tissue palpable. Hypersensitivity and numbness both present medial upper arm. Swelling present on lateral trunk just inferior to axilla.  POSTURE:  Forward head, rounded shoulders  LYMPHEDEMA ASSESSMENT:   UPPER EXTREMITY AROM/PROM:   A/PROM RIGHT   eval   RIGHT 10/03/2023 10/23/23 10/30/23  Shoulder extension 49 48 55 66  Shoulder flexion 156 132 156 157  Shoulder abduction 162 155 165 167  Shoulder internal rotation 63 64 80 85  Shoulder external rotation 86 90 95 95                          (Blank rows = not tested)   A/PROM LEFT   eval  Shoulder extension 50  Shoulder flexion 145  Shoulder abduction 165  Shoulder internal rotation 77  Shoulder external rotation 83                          (Blank rows = not tested)    UPPER EXTREMITY STRENGTH: WFL   LYMPHEDEMA ASSESSMENTS (in cm):    LANDMARK RIGHT   eval RIGHT 10/03/2023  10 cm proximal to olecranon process 29.4 29  Olecranon process 24.4 24.2  10 cm proximal to ulnar styloid  process 23.2 21.7  Just proximal to ulnar styloid process 16.3 16.1  Across hand at thumb web space 18.6 18.7  At base of 2nd digit 6.6  6.5  (Blank rows = not tested)   LANDMARK LEFT   eval LEFT 10/03/2023  10 cm proximal to olecranon process 28.5 28.4  Olecranon process 24.1 24.3  10 cm proximal to ulnar styloid process 21.6 21.7  Just proximal to ulnar styloid process 16 15.7  Across hand at thumb web space 18.5 18.4  At base of 2nd digit 6.4 6.2  (Blank rows = not tested)  Surgery type/Date: 09/04/2023 Number of lymph nodes removed: 7 Current/past treatment (chemo, radiation, hormone therapy): none Other symptoms:  Heaviness/tightness Yes Pain Yes Pitting edema No Infections No Decreased scar mobility Yes Stemmer sign No  TODAY'S TREATMENT 10/30/23: Therapeutic Exercises Pulleys into flex x 2 mins and abd x 3 mins returning therapist demo Roll yellow ball up wall into flex and then Rt UE abd x 10 each Modified downward dog on wall x 5 reps, 5 sec holds Self Care Answered pts questions within scope of practice about what to expect from her skin changes and ROM over next few weeks of radiation. Pt decided would like to be placed on hold until after radiation and return for final assess then of ROM and cording. Appt made for 12/19/22. Encouraged pt to cont consistency with HEP stretches for now, that way if she does develop worsening skin changes and needs to stop stretching, she will have improved flexibility by then. She verbalized good understanding.  A/ROM Rt shoulder assessed, see graph DASH re taken, only 2.27 % limited now Therapeutic Activities Supine over half foam roll for improved A/ROM and posture: prolonged chest stretch x 60 Bil UE horz abd, alternating flexion x 5bil, bil UE scaption into a V x 10 each, then bil UE abd in a snow angel x 10 reps, 5 sec holds.  Then continuing on foam roll for following: Supine scapular series with yellow theraband  x 10 each  returning therapist demo and tactile cues during for correct UE positioning; narrow and wide grip flex, and bil D2 without foam roll due to pt was having back spasms. Issued red theraband as well for later progression when she feels ready.  10/23/23 Rechecked AROM - goals met Therapeutic Exercises Pulleys into flex x 2 mins and abd x 3 mins returning therapist demo Roll yellow ball up wall into flex and then Rt UE abd x 10 each Modified downward dog on wall x 5 reps, 5 sec holds Therapeutic Activities Supine over half foam roll for improved A/ROM and posture: prolonged chest stretch x 60 Bil UE horz abd, alternating flexion x 5bil, bil UE scaption into a V x 10 each, then bil UE abd in a snow angel x 7 reps, 5 sec holds.  Manual Therapy P/ROM in supine to Rt shoulder into flex, abd and D2 with scapular depression throughout STM to Rt axilla for scar tissue release and lateral trunk where mild tightness palpable  10/16/23: Therapeutic Exercises Pulleys into flex x 2 mins and abd x 3 mins returning therapist demo Roll yellow ball up wall into flex and then Rt UE abd x 10 each Modified downward dog on wall x 5 reps, 5 sec holds Therapeutic Activities Supine over half foam roll for improved A/ROM and posture: Bil UE horz abd, bil UE scaption into a V x 10 each, then bil UE abd in a snow angel x 7 reps, 5 sec holds.  Manual Therapy P/ROM in supine to Rt shoulder into flex, abd and D2 with scapular depression throughout STM to Rt axilla for scar  tissue release and lateral trunk where mild tightness palpable, also to medial scap border when in S/L, pt with some tenderness here but much improved at medial arm Scap Mobs in Lt S/L into retraction and protraction     PATIENT EDUCATION:  Education details: Supine scapular series with yellow theraband, red issued to progress to later when ready Person educated: Patient Education method: Explanation, Demonstration, Tactile cues, Verbal cues,  and Handouts Education comprehension: verbalized understanding, returned demonstration, and verbal cues required  Applied compression foam to area between lateral bra and skin to reduce edema.  HOME EXERCISE PROGRAM: Reviewed previously given post op HEP and revised due to neck pain exercising in sitting. Supine scapular series with yellow theraband but also issued red   ASSESSMENT:  CLINICAL IMPRESSION: Pt is doing excellent and has met her goals. She is going to be placed on hold until completing radiation and would like to return for one final visit after radiation complete for final assess of ROM and cording. Progressed HEP to include postural strength with supine scapular series and encouraged pt to cont consistency with stretches while undergoing radiation. She verbalized good understanding of all.   Pt will benefit from skilled therapeutic intervention to improve on the following deficits: Decreased knowledge of precautions, impaired UE functional use, pain, decreased ROM, postural dysfunction.   PT treatment/interventions: ADL/Self care home management, [id number]- PT Re-evaluation, 97110-Therapeutic exercises, 97530- Therapeutic activity, [id number]- Neuromuscular re-education, 97535- Self Care, 60454- Manual therapy, Patient/Family education, Dry Needling, Manual lymph drainage, and Scar mobilization   GOALS: Goals reviewed with patient? Yes  LONG TERM GOALS:  (STG=LTG)  GOALS Name Target Date  Goal status  1 Patient will demonstrate proper technique with HEP to regain shoulder ROM while preventing neck pain flare up. 11/14/2023 MET  2 Patient will increase right shoulder active flexion to >/= 150 degrees to reach overhead without difficulty. 11/14/2023 MET  3 Patient will increase right shoulder active abduction to >/= 160 degrees to reach to the side and perform daily tasks without difficulty. 11/14/2023 MET  4 Patient will improve her DASH score to be </= 10 for improved overall UE  function. 11/14/2023 MET 10/30/23 - 2/27%  5 Patient will report >/= 50% reduction in right upper arm pain to better tolerate wearing shirts and return to normal functional tasks. 11/14/2023 MET     PLAN:  PT FREQUENCY/DURATION: 1x/week for 6 weeks  PLAN FOR NEXT SESSION: Pt on hold until after radiation. She plans to return on 8/7 for final assess of Rt shoulder A/ROM and assess cording. Review supine scap series issued today.    Roslynn Coombes, PTA 10/30/23 1:12 PM   [facility]  [address], [address]  [place] [address]  [phone]  Over Head Pull: Narrow and Wide Grip   Cancer Rehab [phone]   On back, knees bent, feet flat, band across thighs, elbows straight but relaxed. Pull hands apart (start). Keeping elbows straight, bring arms up and over head, hands toward floor. Keep pull steady on band. Hold momentarily. Return slowly, keeping pull steady, back to start. Then do same with a wider grip on the band (past shoulder width) Repeat _5-10__ times. Band color __yellow____   Side Pull: Double Arm   On back, knees bent, feet flat. Arms perpendicular to body, shoulder level, elbows straight but relaxed. Pull arms out to sides, elbows straight. Resistance band comes across collarbones, hands toward floor. Hold momentarily. Slowly return to starting position. Repeat _5-10__ times. Band  color _yellow____   Sword   On back, knees bent, feet flat, left hand on left hip, right hand above left. Pull right arm DIAGONALLY (hip to shoulder) across chest. Bring right arm along head toward floor. Hold momentarily. Slowly return to starting position. Repeat _5-10__ times. Do with left arm. Band color _yellow_____   Shoulder Rotation: Double Arm   On back, knees bent, feet flat, elbows tucked at sides, bent 90, hands palms up. Pull hands apart and down toward floor, keeping elbows near sides. Hold momentarily. Slowly return to starting position. Repeat  _5-10__ times. Band color __yellow____

## 2023-10-30 NOTE — Patient Instructions (Addendum)
 Marcia Harvey

## 2023-10-31 ENCOUNTER — Ambulatory Visit
Admission: RE | Admit: 2023-10-31 | Discharge: 2023-10-31 | Discharge status: Home / Self Care | Source: Ambulatory Visit | Attending: Radiation Oncology | Admitting: Radiation Oncology

## 2023-10-31 ENCOUNTER — Other Ambulatory Visit: Payer: Self-pay

## 2023-10-31 DIAGNOSIS — Z510 Encounter for antineoplastic radiation therapy: Secondary | ICD-10-CM | POA: Diagnosis not present

## 2023-10-31 LAB — RAD ONC ARIA SESSION SUMMARY
Course Elapsed Days: 3
Plan Fractions Treated to Date: 4
Plan Prescribed Dose Per Fraction: 1.8 Gy
Plan Total Fractions Prescribed: 28
Plan Total Prescribed Dose: 50.4 Gy
Reference Point Dosage Given to Date: 7.2 Gy
Reference Point Session Dosage Given: 1.8 Gy
Session Number: 4

## 2023-11-01 ENCOUNTER — Other Ambulatory Visit: Payer: Self-pay

## 2023-11-01 ENCOUNTER — Ambulatory Visit
Admission: RE | Admit: 2023-11-01 | Discharge: 2023-11-01 | Disposition: A | Discharge status: Home / Self Care | Source: Ambulatory Visit | Attending: Radiation Oncology | Admitting: Radiation Oncology

## 2023-11-01 DIAGNOSIS — Z510 Encounter for antineoplastic radiation therapy: Secondary | ICD-10-CM | POA: Diagnosis not present

## 2023-11-01 LAB — RAD ONC ARIA SESSION SUMMARY
Course Elapsed Days: 4
Plan Fractions Treated to Date: 5
Plan Prescribed Dose Per Fraction: 1.8 Gy
Plan Total Fractions Prescribed: 28
Plan Total Prescribed Dose: 50.4 Gy
Reference Point Dosage Given to Date: 9 Gy
Reference Point Session Dosage Given: 1.8 Gy
Session Number: 5

## 2023-11-04 ENCOUNTER — Other Ambulatory Visit: Payer: Self-pay

## 2023-11-04 ENCOUNTER — Ambulatory Visit
Admission: RE | Admit: 2023-11-04 | Discharge: 2023-11-04 | Disposition: A | Discharge status: Home / Self Care | Source: Ambulatory Visit | Attending: Radiation Oncology | Admitting: Radiation Oncology

## 2023-11-04 DIAGNOSIS — Z510 Encounter for antineoplastic radiation therapy: Secondary | ICD-10-CM | POA: Diagnosis not present

## 2023-11-04 LAB — RAD ONC ARIA SESSION SUMMARY
Course Elapsed Days: 7
Plan Fractions Treated to Date: 6
Plan Prescribed Dose Per Fraction: 1.8 Gy
Plan Total Fractions Prescribed: 28
Plan Total Prescribed Dose: 50.4 Gy
Reference Point Dosage Given to Date: 10.8 Gy
Reference Point Session Dosage Given: 1.8 Gy
Session Number: 6

## 2023-11-05 ENCOUNTER — Ambulatory Visit
Admission: RE | Admit: 2023-11-05 | Discharge: 2023-11-05 | Disposition: A | Discharge status: Home / Self Care | Source: Ambulatory Visit | Attending: Radiation Oncology | Admitting: Radiation Oncology

## 2023-11-05 ENCOUNTER — Other Ambulatory Visit: Payer: Self-pay

## 2023-11-05 DIAGNOSIS — Z510 Encounter for antineoplastic radiation therapy: Secondary | ICD-10-CM | POA: Diagnosis not present

## 2023-11-05 LAB — RAD ONC ARIA SESSION SUMMARY
Course Elapsed Days: 8
Plan Fractions Treated to Date: 7
Plan Prescribed Dose Per Fraction: 1.8 Gy
Plan Total Fractions Prescribed: 28
Plan Total Prescribed Dose: 50.4 Gy
Reference Point Dosage Given to Date: 12.6 Gy
Reference Point Session Dosage Given: 1.8 Gy
Session Number: 7

## 2023-11-06 ENCOUNTER — Encounter

## 2023-11-06 ENCOUNTER — Ambulatory Visit
Admission: RE | Admit: 2023-11-06 | Discharge: 2023-11-06 | Disposition: A | Discharge status: Home / Self Care | Source: Ambulatory Visit | Attending: Radiation Oncology | Admitting: Radiation Oncology

## 2023-11-06 ENCOUNTER — Other Ambulatory Visit: Payer: Self-pay

## 2023-11-06 DIAGNOSIS — Z510 Encounter for antineoplastic radiation therapy: Secondary | ICD-10-CM | POA: Diagnosis not present

## 2023-11-06 LAB — RAD ONC ARIA SESSION SUMMARY
Course Elapsed Days: 9
Plan Fractions Treated to Date: 8
Plan Prescribed Dose Per Fraction: 1.8 Gy
Plan Total Fractions Prescribed: 28
Plan Total Prescribed Dose: 50.4 Gy
Reference Point Dosage Given to Date: 14.4 Gy
Reference Point Session Dosage Given: 1.8 Gy
Session Number: 8

## 2023-11-07 ENCOUNTER — Ambulatory Visit
Admission: RE | Admit: 2023-11-07 | Discharge: 2023-11-07 | Disposition: A | Discharge status: Home / Self Care | Source: Ambulatory Visit | Attending: Radiation Oncology | Admitting: Radiation Oncology

## 2023-11-07 ENCOUNTER — Other Ambulatory Visit: Payer: Self-pay

## 2023-11-07 DIAGNOSIS — Z510 Encounter for antineoplastic radiation therapy: Secondary | ICD-10-CM | POA: Diagnosis not present

## 2023-11-07 LAB — RAD ONC ARIA SESSION SUMMARY
Course Elapsed Days: 10
Plan Fractions Treated to Date: 9
Plan Prescribed Dose Per Fraction: 1.8 Gy
Plan Total Fractions Prescribed: 28
Plan Total Prescribed Dose: 50.4 Gy
Reference Point Dosage Given to Date: 16.2 Gy
Reference Point Session Dosage Given: 1.8 Gy
Session Number: 9

## 2023-11-08 ENCOUNTER — Ambulatory Visit
Admission: RE | Admit: 2023-11-08 | Discharge: 2023-11-08 | Disposition: A | Discharge status: Home / Self Care | Source: Ambulatory Visit | Attending: Radiation Oncology | Admitting: Radiation Oncology

## 2023-11-08 ENCOUNTER — Other Ambulatory Visit: Payer: Self-pay

## 2023-11-08 ENCOUNTER — Telehealth: Payer: Self-pay

## 2023-11-08 DIAGNOSIS — Z510 Encounter for antineoplastic radiation therapy: Secondary | ICD-10-CM | POA: Diagnosis not present

## 2023-11-08 LAB — RAD ONC ARIA SESSION SUMMARY
Course Elapsed Days: 11
Plan Fractions Treated to Date: 10
Plan Prescribed Dose Per Fraction: 1.8 Gy
Plan Total Fractions Prescribed: 28
Plan Total Prescribed Dose: 50.4 Gy
Reference Point Dosage Given to Date: 18 Gy
Reference Point Session Dosage Given: 1.8 Gy
Session Number: 10

## 2023-11-08 NOTE — Telephone Encounter (Signed)
 Verbally confirmed appt for 6/30

## 2023-11-11 ENCOUNTER — Other Ambulatory Visit: Payer: Self-pay

## 2023-11-11 ENCOUNTER — Ambulatory Visit
Admission: RE | Admit: 2023-11-11 | Discharge: 2023-11-11 | Disposition: A | Discharge status: Home / Self Care | Source: Ambulatory Visit | Attending: Radiation Oncology | Admitting: Radiation Oncology

## 2023-11-11 ENCOUNTER — Inpatient Hospital Stay: Admitting: Hematology and Oncology

## 2023-11-11 VITALS — BP 148/83 | HR 82 | Temp 98.3°F | Resp 17 | Wt 175.2 lb

## 2023-11-11 DIAGNOSIS — C50611 Malignant neoplasm of axillary tail of right female breast: Secondary | ICD-10-CM | POA: Insufficient documentation

## 2023-11-11 DIAGNOSIS — Z85820 Personal history of malignant melanoma of skin: Secondary | ICD-10-CM | POA: Insufficient documentation

## 2023-11-11 DIAGNOSIS — Z87891 Personal history of nicotine dependence: Secondary | ICD-10-CM | POA: Insufficient documentation

## 2023-11-11 DIAGNOSIS — F419 Anxiety disorder, unspecified: Secondary | ICD-10-CM | POA: Insufficient documentation

## 2023-11-11 DIAGNOSIS — Z1722 Progesterone receptor negative status: Secondary | ICD-10-CM | POA: Insufficient documentation

## 2023-11-11 DIAGNOSIS — C4492 Squamous cell carcinoma of skin, unspecified: Secondary | ICD-10-CM | POA: Insufficient documentation

## 2023-11-11 DIAGNOSIS — Z79899 Other long term (current) drug therapy: Secondary | ICD-10-CM | POA: Insufficient documentation

## 2023-11-11 DIAGNOSIS — I10 Essential (primary) hypertension: Secondary | ICD-10-CM | POA: Insufficient documentation

## 2023-11-11 DIAGNOSIS — Z510 Encounter for antineoplastic radiation therapy: Secondary | ICD-10-CM | POA: Diagnosis not present

## 2023-11-11 DIAGNOSIS — F32A Depression, unspecified: Secondary | ICD-10-CM | POA: Insufficient documentation

## 2023-11-11 DIAGNOSIS — Z171 Estrogen receptor negative status [ER-]: Secondary | ICD-10-CM | POA: Diagnosis not present

## 2023-11-11 LAB — RAD ONC ARIA SESSION SUMMARY
Course Elapsed Days: 14
Plan Fractions Treated to Date: 11
Plan Prescribed Dose Per Fraction: 1.8 Gy
Plan Total Fractions Prescribed: 28
Plan Total Prescribed Dose: 50.4 Gy
Reference Point Dosage Given to Date: 19.8 Gy
Reference Point Session Dosage Given: 1.8 Gy
Session Number: 11

## 2023-11-11 NOTE — Progress Notes (Signed)
 [facility] CONSULT NOTE  Patient Care Team: [name], MD as PCP - General (Family Medicine) Georjean Darice HERO, MD as Consulting Physician (Neurology) Glean Stephane BROCKS, RN (Inactive) as Oncology Nurse Navigator Tyree Nanetta SAILOR, RN as Oncology Nurse Navigator Vanderbilt Ned, MD as Consulting Physician (General Surgery) Loretha Ash, MD as Consulting Physician (Hematology and Oncology) Shannon Agent, MD as Consulting Physician (Radiation Oncology)  CHIEF COMPLAINTS/PURPOSE OF CONSULTATION:  Newly diagnosed breast cancer  HISTORY OF PRESENTING ILLNESS:  Marcia Harvey 78 y.o. female is here because of recent diagnosis of right axillary mass  I reviewed her records extensively and collaborated the history with the patient.  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, estrogen receptor negative (HCC)   07/31/2023 Cancer Staging   Staging form: Breast, AJCC 8th Edition - Clinical stage from 07/31/2023: Stage Unknown (cTX, cN1, cM0, ER-, PR-, HER2-) - Signed by Loretha Ash, MD on 07/31/2023 Stage prefix: Initial diagnosis Laterality: Right Staged by: Pathologist and managing physician Stage used in treatment planning: Yes National guidelines used in treatment planning: Yes Type of national guideline used in treatment planning: NCCN   08/12/2023 Genetic Testing   Single, 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  Report date is 08/12/2023.   The CancerNext-Expanded gene panel offered by [facility] and includes sequencing, rearrangement, and RNA analysis for the following 76 genes: AIP, ALK, APC, ATM, AXIN2, BAP1, BARD1, BMPR1A, BRCA1, BRCA2, BRIP1, CDC73, CDH1, CDK4, CDKN1B, CDKN2A, CEBPA, CHEK2, CTNNA1, DDX41, DICER1, ETV6, FH, FLCN, GATA2, LZTR1, MAX, MBD4, MEN1, MET, MLH1, MSH2, MSH3, MSH6, MUTYH, NF1, NF2, NTHL1, PALB2, PHOX2B, PMS2, POT1, PRKAR1A, PTCH1, PTEN, RAD51C, RAD51D, RB1, RET, RUNX1, SDHA, SDHAF2, SDHB, SDHC, SDHD, SMAD4, SMARCA4, SMARCB1, SMARCE1, STK11, SUFU, TMEM127, TP53, TSC1, TSC2, VHL, and WT1 (sequencing and deletion/duplication); EGFR, HOXB13, KIT, MITF, PDGFRA, POLD1, and POLE (sequencing only); EPCAM and GREM1 (deletion/duplication only).    Squamous cell carcinoma of skin  11/11/2023 Initial Diagnosis   Squamous cell carcinoma of skin   12/20/2023 -  Chemotherapy   Patient is on Treatment Plan : ADVANCED CUTANEOUS SQUAMOUS CELL CARCINOMA Cemiplimab q21d       Discussed the use of AI scribe software for clinical note transcription with the patient, who gave verbal consent to proceed.  History of Present Illness  Marcia Harvey is a 78 year old female with recurrent squamous cell carcinoma who presents for follow-up during radiation therapy.  She is currently undergoing radiation therapy for squamous cell carcinoma, with the treatment expected to conclude on December 05, 2023.  She underwent surgery on September 01, 2023, which revealed poorly differentiated squamous cell carcinoma measuring about 4.8 cms. Six lymph nodes were tested, all of which were negative for cancer. surgical margins were clear, indicating no extension beyond the area.  Her current medications include lisinopril  and hydrochlorothiazide  for hypertension. She has discontinued oxycodone  and prednisone . She plans to discuss her blood pressure medications with her  regular doctor  in September.  Rest of the pertinent 10 point ROS reviewed and neg.  MEDICAL HISTORY:  Past Medical History:  Diagnosis Date   Anxiety    Anxiety    Arthritis    Cancer (HCC) 07/2020   SCC chest   Depression    Fall from horse 01/27/12   CT chest/Abd/Pelvis done at [facility]. Findings: Nondisplaced fx of posterior right 11th rib   GERD (gastroesophageal reflux disease)    Hypertension    Seizures (HCC)    been through testing, no issues with seizures or epilepsy   Syncopal episodes    seizure with episode   White matter disease     SURGICAL HISTORY: Past Surgical History:  Procedure Laterality Date   AXILLARY LYMPH NODE DISSECTION Right 09/04/2023   Procedure: REDGIE HARD;  Surgeon: Vanderbilt Ned, MD;  Location: [facility];  Service: General;  Laterality: Right;  GEN w/PEC BLOCK   BREAST LUMPECTOMY WITH RADIOACTIVE SEED LOCALIZATION Left 06/21/2020   Procedure: LEFT BREAST LUMPECTOMY WITH RADIOACTIVE SEED LOCALIZATION;  Surgeon: Belinda Cough, MD;  Location: [facility];  Service: General;  Laterality: Left;   BUNIONECTOMY Bilateral 1978   COLONOSCOPY     EYE SURGERY     MELANOMA EXCISION Right 08/02/2020   Procedure: 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;  Surgeon: Belinda Cough, MD;  Location: [facility];  Service: General;  Laterality: Right;  ROOM 8 STARTING AT 04:15PM FOR 45 MIN   SENTINEL NODE BIOPSY Right 09/04/2023   Procedure: BIOPSY, LYMPH NODE, SENTINEL;  Surgeon: Vanderbilt Ned, MD;  Location: [facility];  Service: General;  Laterality: Right;  SEED LOCALIZED RIGHT AXILLARY LYMPHNODE EXCIONSAL BIOPSY    SOCIAL HISTORY: Social History   Socioeconomic History   Marital status: Single    Spouse name: Not on file   Number of children: Not on file   Years of education: Not on file   Highest education level: Not on file  Occupational History   Not on file   Tobacco Use   Smoking status: Former    Current packs/day: 0.00    Types: Cigarettes    Quit date: 05/21/2003    Years since quitting: 20.4   Smokeless tobacco: Never  Vaping Use   Vaping status: Never Used  Substance and Sexual Activity   Alcohol use: Not Currently    Comment: 3-4x per year   Drug use: No   Sexual activity: Not Currently    Birth control/protection: Post-menopausal  Other Topics Concern   Not on file  Social History Narrative   Are you right handed or left handed? right   Are you currently employed ? no   What is your current occupation? Retired    Do you live at home alone? Alone    Who lives with you? NA    What type of home do you live in: 1 story or 2 story?  1 story        Social Drivers of Corporate investment banker Strain: Low Risk  (07/22/2023)   Received from [facility]   Overall Financial Resource Strain (CARDIA)    Difficulty of Paying Living Expenses: Not very hard  Food Insecurity: No Food Insecurity (10/02/2023)   Hunger Vital Sign    Worried About Running Out of Food in the Last Year: Never true    Ran Out of Food in the Last Year: Never true  Transportation Needs: No Transportation Needs (10/02/2023)  PRAPARE - Administrator, Civil Service (Medical): No    Lack of Transportation (Non-Medical): No  Physical Activity: Sufficiently Active (07/22/2023)   Received from [facility]   Exercise Vital Sign    On average, how many days per week do you engage in moderate to strenuous exercise (like a brisk walk)?: 5 days    On average, how many minutes do you engage in exercise at this level?: 60 min  Stress: Stress Concern Present (07/22/2023)   Received from [facility] Occupational [facility] - Occupational Stress Questionnaire    Feeling of Stress : To some extent  Social Connections: Socially Integrated (07/22/2023)   Received from [facility]   Social Network    How would you rate your social network  (family, work, friends)?: Good participation with social networks  Intimate Partner Violence: Not At Risk (10/02/2023)   Humiliation, Afraid, Rape, and Kick questionnaire    Fear of Current or Ex-Partner: No    Emotionally Abused: No    Physically Abused: No    Sexually Abused: No    FAMILY HISTORY: Family History  Problem Relation Age of Onset   Colon cancer Mother 57       d. 11   Prostate cancer Father 20   Lung cancer Maternal Uncle        dx >50   Cancer Paternal Aunt        unknown type; mets; d. >50   Diabetes Maternal Grandfather    Diabetes Paternal Grandmother     ALLERGIES:  is allergic to codeine, iodinated contrast media, iohexol, shellfish-derived products, bee pollen, iodine, wound dressing adhesive, and tape.  MEDICATIONS:  Current Outpatient Medications  Medication Sig Dispense Refill   ascorbic acid (VITAMIN C) 500 MG tablet Take by mouth.     atorvastatin (LIPITOR) 40 MG tablet Take 40 mg by mouth daily.     augmented betamethasone dipropionate (DIPROLENE-AF) 0.05 % cream Apply topically 2 (two) times daily.     b complex vitamins tablet Take 1 tablet by mouth daily.     carboxymethylcellul-glycerin (OPTIVE) 0.5-0.9 % ophthalmic solution Apply to eye.     Cholecalciferol (VITAMIN D3) 2000 UNITS TABS Take 2,000 Units by mouth daily.     gabapentin  (NEURONTIN ) 300 MG capsule Take 300 mg by mouth 3 (three) times daily. Ordered by Dr. Debby Shipper for nerve pain post op. She said he told her to take 2 at a time.     lisinopril -hydrochlorothiazide  (PRINZIDE ,ZESTORETIC ) 10-12.5 MG per tablet Take 1 tablet by mouth daily. NEED VISIT, LABS!!  2nd NOTICE! 30 tablet 0   omeprazole (PRILOSEC) 40 MG capsule TAKE 1 CAPSULE BY MOUTH EVERY DAY     sertraline (ZOLOFT) 25 MG tablet TAKE 1 TABLET(25 MG) BY MOUTH DAILY     traZODone (DESYREL) 50 MG tablet 25 to 50 mg at bedtime to help with sleep     No current facility-administered medications for this visit.    REVIEW OF  SYSTEMS:   Constitutional: Denies fevers, chills or abnormal night sweats Eyes: Denies blurriness of vision, double vision or watery eyes Ears, nose, mouth, throat, and face: Denies mucositis or sore throat Respiratory: Denies cough, dyspnea or wheezes Cardiovascular: Denies palpitation, chest discomfort or lower extremity swelling Gastrointestinal:  Denies nausea, heartburn or change in bowel habits Skin: Denies abnormal skin rashes Lymphatics: Denies new lymphadenopathy or easy bruising Neurological:Denies numbness, tingling or new weaknesses Behavioral/Psych: Mood is stable, no new changes  Breast: Denies any palpable lumps or discharge.  Palpable right axillary mass All other systems were reviewed with the patient and are negative.  PHYSICAL EXAMINATION: ECOG PERFORMANCE STATUS: 0 - Asymptomatic  Vitals:   11/11/23 0905 11/11/23 0927  BP: (!) 151/94 (!) 148/83  Pulse: 82   Resp: 17   Temp: 98.3 F (36.8 C)   SpO2: 98%     Filed Weights   11/11/23 0905  Weight: 175 lb 3.2 oz (79.5 kg)     GENERAL:alert, no distress and comfortable   LABORATORY DATA:  I have reviewed the data as listed Lab Results  Component Value Date   WBC 8.6 06/07/2014   HGB 15.0 06/07/2014   HCT 44.0 06/07/2014   MCV 83.9 06/07/2014   PLT 273 06/07/2014   Lab Results  Component Value Date   NA 139 09/02/2023   K 4.0 09/02/2023   CL 103 09/02/2023   CO2 25 09/02/2023    RADIOGRAPHIC STUDIES: I have personally reviewed the radiological reports and agreed with the findings in the report.  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 PET scan showed no other primary or widespread metastasis. Tumor excised with negative margins. Adjuvant  radiation therapy recommended due to recurrence risk. Systemic therapy with cemiplimab will be considered based on C POST data given local recurrence and poorly differentiated histology, tumor measuring 4.8 cms. I discussed the pathology with Dr Rebbecca who did suggest that this could be a completely replaced LN but they cannot comment on this, since there is no LN histology noted in the final path. We have recommended adj cemiplimab based on the NEJM publication. I have reviewed mechanism of action of cemiplimab, adverse effects including but not limited to fatigue, hypo-/hyperthyroidism, GI symptoms, other adverse effects such as nephritis, pneumonitis etc.  We will arrange for a chemo class. Anticipated initiation of treatment in August.   -   Gissele Narducci MD       All questions were answered. The patient knows to call the clinic with any problems, questions or concerns.    Amber Stalls, MD 11/11/23

## 2023-11-11 NOTE — Assessment & Plan Note (Addendum)
  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 PET scan showed no other primary or widespread metastasis. Tumor excised with negative margins. Adjuvant radiation therapy recommended due to recurrence risk. Systemic therapy with cemiplimab will be considered based on C POST data given local recurrence and poorly differentiated histology, tumor measuring 4.8 cms. I discussed the pathology with Dr Rebbecca who did suggest that this could be a completely replaced LN but they cannot comment on this, since there is no LN histology noted in the final path. We have recommended adj cemiplimab based on the NEJM publication. I have reviewed mechanism of action of cemiplimab, adverse effects including but not limited to fatigue, hypo-/hyperthyroidism, GI symptoms, other adverse effects such as nephritis, pneumonitis etc.  We will arrange for a chemo class. Anticipated initiation of treatment in August.   -   Amber Stalls MD

## 2023-11-11 NOTE — Progress Notes (Signed)
 OFF PATHWAY REGIMEN - Other  No Change  Continue With Treatment as Ordered.  Original Decision Date/Time: 09/18/2023 16:28   [id number]:Cemiplimab 350 mg IV D1 q21 Days:   A cycle is every 21 days:     Cemiplimab-rwlc   **Always confirm dose/schedule in your pharmacy ordering system**  Patient Characteristics: Intent of Therapy: Curative Intent, Discussed with Patient

## 2023-11-12 ENCOUNTER — Other Ambulatory Visit: Payer: Self-pay

## 2023-11-12 ENCOUNTER — Ambulatory Visit
Admission: RE | Admit: 2023-11-12 | Discharge: 2023-11-12 | Disposition: A | Discharge status: Home / Self Care | Source: Ambulatory Visit | Attending: Radiation Oncology | Admitting: Radiation Oncology

## 2023-11-12 DIAGNOSIS — C4492 Squamous cell carcinoma of skin, unspecified: Secondary | ICD-10-CM | POA: Diagnosis not present

## 2023-11-12 DIAGNOSIS — Z510 Encounter for antineoplastic radiation therapy: Secondary | ICD-10-CM | POA: Diagnosis not present

## 2023-11-12 DIAGNOSIS — C773 Secondary and unspecified malignant neoplasm of axilla and upper limb lymph nodes: Secondary | ICD-10-CM | POA: Insufficient documentation

## 2023-11-12 LAB — RAD ONC ARIA SESSION SUMMARY
Course Elapsed Days: 15
Plan Fractions Treated to Date: 12
Plan Prescribed Dose Per Fraction: 1.8 Gy
Plan Total Fractions Prescribed: 28
Plan Total Prescribed Dose: 50.4 Gy
Reference Point Dosage Given to Date: 21.6 Gy
Reference Point Session Dosage Given: 1.8 Gy
Session Number: 12

## 2023-11-13 ENCOUNTER — Ambulatory Visit
Admission: RE | Admit: 2023-11-13 | Discharge: 2023-11-13 | Disposition: A | Discharge status: Home / Self Care | Source: Ambulatory Visit | Attending: Radiation Oncology | Admitting: Radiation Oncology

## 2023-11-13 ENCOUNTER — Other Ambulatory Visit: Payer: Self-pay

## 2023-11-13 DIAGNOSIS — Z510 Encounter for antineoplastic radiation therapy: Secondary | ICD-10-CM | POA: Diagnosis not present

## 2023-11-13 LAB — RAD ONC ARIA SESSION SUMMARY
Course Elapsed Days: 16
Plan Fractions Treated to Date: 13
Plan Prescribed Dose Per Fraction: 1.8 Gy
Plan Total Fractions Prescribed: 28
Plan Total Prescribed Dose: 50.4 Gy
Reference Point Dosage Given to Date: 23.4 Gy
Reference Point Session Dosage Given: 1.8 Gy
Session Number: 13

## 2023-11-14 ENCOUNTER — Ambulatory Visit
Admission: RE | Admit: 2023-11-14 | Discharge: 2023-11-14 | Disposition: A | Discharge status: Home / Self Care | Source: Ambulatory Visit | Attending: Radiation Oncology | Admitting: Radiation Oncology

## 2023-11-14 ENCOUNTER — Other Ambulatory Visit: Payer: Self-pay

## 2023-11-14 DIAGNOSIS — Z510 Encounter for antineoplastic radiation therapy: Secondary | ICD-10-CM | POA: Diagnosis not present

## 2023-11-14 LAB — RAD ONC ARIA SESSION SUMMARY
Course Elapsed Days: 17
Plan Fractions Treated to Date: 14
Plan Prescribed Dose Per Fraction: 1.8 Gy
Plan Total Fractions Prescribed: 28
Plan Total Prescribed Dose: 50.4 Gy
Reference Point Dosage Given to Date: 25.2 Gy
Reference Point Session Dosage Given: 1.8 Gy
Session Number: 14

## 2023-11-18 ENCOUNTER — Ambulatory Visit
Admission: RE | Admit: 2023-11-18 | Discharge: 2023-11-18 | Disposition: A | Discharge status: Home / Self Care | Source: Ambulatory Visit | Attending: Radiation Oncology | Admitting: Radiation Oncology

## 2023-11-18 ENCOUNTER — Other Ambulatory Visit: Payer: Self-pay

## 2023-11-18 DIAGNOSIS — Z510 Encounter for antineoplastic radiation therapy: Secondary | ICD-10-CM | POA: Diagnosis not present

## 2023-11-18 LAB — RAD ONC ARIA SESSION SUMMARY
Course Elapsed Days: 21
Plan Fractions Treated to Date: 15
Plan Prescribed Dose Per Fraction: 1.8 Gy
Plan Total Fractions Prescribed: 28
Plan Total Prescribed Dose: 50.4 Gy
Reference Point Dosage Given to Date: 27 Gy
Reference Point Session Dosage Given: 1.8 Gy
Session Number: 15

## 2023-11-19 ENCOUNTER — Ambulatory Visit
Admission: RE | Admit: 2023-11-19 | Discharge: 2023-11-19 | Disposition: A | Discharge status: Home / Self Care | Source: Ambulatory Visit | Attending: Radiation Oncology | Admitting: Radiation Oncology

## 2023-11-19 ENCOUNTER — Other Ambulatory Visit: Payer: Self-pay

## 2023-11-19 DIAGNOSIS — Z510 Encounter for antineoplastic radiation therapy: Secondary | ICD-10-CM | POA: Diagnosis not present

## 2023-11-19 LAB — RAD ONC ARIA SESSION SUMMARY
Course Elapsed Days: 22
Plan Fractions Treated to Date: 16
Plan Prescribed Dose Per Fraction: 1.8 Gy
Plan Total Fractions Prescribed: 28
Plan Total Prescribed Dose: 50.4 Gy
Reference Point Dosage Given to Date: 28.8 Gy
Reference Point Session Dosage Given: 1.8 Gy
Session Number: 16

## 2023-11-20 ENCOUNTER — Ambulatory Visit
Admission: RE | Admit: 2023-11-20 | Discharge: 2023-11-20 | Disposition: A | Discharge status: Home / Self Care | Source: Ambulatory Visit | Attending: Radiation Oncology | Admitting: Radiation Oncology

## 2023-11-20 ENCOUNTER — Other Ambulatory Visit: Payer: Self-pay

## 2023-11-20 DIAGNOSIS — Z510 Encounter for antineoplastic radiation therapy: Secondary | ICD-10-CM | POA: Diagnosis not present

## 2023-11-20 LAB — RAD ONC ARIA SESSION SUMMARY
Course Elapsed Days: 23
Plan Fractions Treated to Date: 17
Plan Prescribed Dose Per Fraction: 1.8 Gy
Plan Total Fractions Prescribed: 28
Plan Total Prescribed Dose: 50.4 Gy
Reference Point Dosage Given to Date: 30.6 Gy
Reference Point Session Dosage Given: 1.8 Gy
Session Number: 17

## 2023-11-21 ENCOUNTER — Ambulatory Visit
Admission: RE | Admit: 2023-11-21 | Discharge: 2023-11-21 | Disposition: A | Discharge status: Home / Self Care | Source: Ambulatory Visit | Attending: Radiation Oncology | Admitting: Radiation Oncology

## 2023-11-21 ENCOUNTER — Other Ambulatory Visit: Payer: Self-pay

## 2023-11-21 DIAGNOSIS — Z510 Encounter for antineoplastic radiation therapy: Secondary | ICD-10-CM | POA: Diagnosis not present

## 2023-11-21 LAB — RAD ONC ARIA SESSION SUMMARY
Course Elapsed Days: 24
Plan Fractions Treated to Date: 18
Plan Prescribed Dose Per Fraction: 1.8 Gy
Plan Total Fractions Prescribed: 28
Plan Total Prescribed Dose: 50.4 Gy
Reference Point Dosage Given to Date: 32.4 Gy
Reference Point Session Dosage Given: 1.8 Gy
Session Number: 18

## 2023-11-22 ENCOUNTER — Ambulatory Visit
Admission: RE | Admit: 2023-11-22 | Discharge: 2023-11-22 | Disposition: A | Discharge status: Home / Self Care | Source: Ambulatory Visit | Attending: Radiation Oncology | Admitting: Radiation Oncology

## 2023-11-22 ENCOUNTER — Other Ambulatory Visit: Payer: Self-pay

## 2023-11-22 DIAGNOSIS — Z510 Encounter for antineoplastic radiation therapy: Secondary | ICD-10-CM | POA: Diagnosis not present

## 2023-11-22 LAB — RAD ONC ARIA SESSION SUMMARY
Course Elapsed Days: 25
Plan Fractions Treated to Date: 19
Plan Prescribed Dose Per Fraction: 1.8 Gy
Plan Total Fractions Prescribed: 28
Plan Total Prescribed Dose: 50.4 Gy
Reference Point Dosage Given to Date: 34.2 Gy
Reference Point Session Dosage Given: 1.8 Gy
Session Number: 19

## 2023-11-25 ENCOUNTER — Ambulatory Visit
Admission: RE | Admit: 2023-11-25 | Discharge: 2023-11-25 | Disposition: A | Discharge status: Home / Self Care | Source: Ambulatory Visit | Attending: Radiation Oncology | Admitting: Radiation Oncology

## 2023-11-25 ENCOUNTER — Other Ambulatory Visit: Payer: Self-pay

## 2023-11-25 ENCOUNTER — Telehealth: Payer: Self-pay | Admitting: Hematology and Oncology

## 2023-11-25 DIAGNOSIS — Z510 Encounter for antineoplastic radiation therapy: Secondary | ICD-10-CM | POA: Diagnosis not present

## 2023-11-25 LAB — RAD ONC ARIA SESSION SUMMARY
Course Elapsed Days: 28
Plan Fractions Treated to Date: 20
Plan Prescribed Dose Per Fraction: 1.8 Gy
Plan Total Fractions Prescribed: 28
Plan Total Prescribed Dose: 50.4 Gy
Reference Point Dosage Given to Date: 36 Gy
Reference Point Session Dosage Given: 1.8 Gy
Session Number: 20

## 2023-11-25 NOTE — Telephone Encounter (Signed)
 Left patient a vm regarding upcoming appointment

## 2023-11-26 ENCOUNTER — Other Ambulatory Visit: Payer: Self-pay

## 2023-11-26 ENCOUNTER — Ambulatory Visit
Admission: RE | Admit: 2023-11-26 | Discharge: 2023-11-26 | Disposition: A | Discharge status: Home / Self Care | Source: Ambulatory Visit | Attending: Radiation Oncology | Admitting: Radiation Oncology

## 2023-11-26 DIAGNOSIS — Z510 Encounter for antineoplastic radiation therapy: Secondary | ICD-10-CM | POA: Diagnosis not present

## 2023-11-26 LAB — RAD ONC ARIA SESSION SUMMARY
Course Elapsed Days: 29
Plan Fractions Treated to Date: 21
Plan Prescribed Dose Per Fraction: 1.8 Gy
Plan Total Fractions Prescribed: 28
Plan Total Prescribed Dose: 50.4 Gy
Reference Point Dosage Given to Date: 37.8 Gy
Reference Point Session Dosage Given: 1.8 Gy
Session Number: 21

## 2023-11-27 ENCOUNTER — Other Ambulatory Visit: Payer: Self-pay

## 2023-11-27 ENCOUNTER — Ambulatory Visit
Admission: RE | Admit: 2023-11-27 | Discharge: 2023-11-27 | Disposition: A | Discharge status: Home / Self Care | Source: Ambulatory Visit | Attending: Radiation Oncology | Admitting: Radiation Oncology

## 2023-11-27 DIAGNOSIS — Z510 Encounter for antineoplastic radiation therapy: Secondary | ICD-10-CM | POA: Diagnosis not present

## 2023-11-27 LAB — RAD ONC ARIA SESSION SUMMARY
Course Elapsed Days: 30
Plan Fractions Treated to Date: 22
Plan Prescribed Dose Per Fraction: 1.8 Gy
Plan Total Fractions Prescribed: 28
Plan Total Prescribed Dose: 50.4 Gy
Reference Point Dosage Given to Date: 39.6 Gy
Reference Point Session Dosage Given: 1.8 Gy
Session Number: 22

## 2023-11-28 ENCOUNTER — Ambulatory Visit
Admission: RE | Admit: 2023-11-28 | Discharge: 2023-11-28 | Disposition: A | Discharge status: Home / Self Care | Source: Ambulatory Visit | Attending: Radiation Oncology | Admitting: Radiation Oncology

## 2023-11-28 ENCOUNTER — Other Ambulatory Visit: Payer: Self-pay

## 2023-11-28 DIAGNOSIS — Z510 Encounter for antineoplastic radiation therapy: Secondary | ICD-10-CM | POA: Diagnosis not present

## 2023-11-28 LAB — RAD ONC ARIA SESSION SUMMARY
Course Elapsed Days: 31
Plan Fractions Treated to Date: 23
Plan Prescribed Dose Per Fraction: 1.8 Gy
Plan Total Fractions Prescribed: 28
Plan Total Prescribed Dose: 50.4 Gy
Reference Point Dosage Given to Date: 41.4 Gy
Reference Point Session Dosage Given: 1.8 Gy
Session Number: 23

## 2023-11-29 ENCOUNTER — Ambulatory Visit
Admission: RE | Admit: 2023-11-29 | Discharge: 2023-11-29 | Disposition: A | Discharge status: Home / Self Care | Source: Ambulatory Visit | Attending: Radiation Oncology | Admitting: Radiation Oncology

## 2023-11-29 ENCOUNTER — Other Ambulatory Visit: Payer: Self-pay

## 2023-11-29 DIAGNOSIS — Z510 Encounter for antineoplastic radiation therapy: Secondary | ICD-10-CM | POA: Diagnosis not present

## 2023-11-29 LAB — RAD ONC ARIA SESSION SUMMARY
Course Elapsed Days: 32
Plan Fractions Treated to Date: 24
Plan Prescribed Dose Per Fraction: 1.8 Gy
Plan Total Fractions Prescribed: 28
Plan Total Prescribed Dose: 50.4 Gy
Reference Point Dosage Given to Date: 43.2 Gy
Reference Point Session Dosage Given: 1.8 Gy
Session Number: 24

## 2023-12-01 NOTE — Therapy (Unsigned)
 OUTPATIENT PHYSICAL THERAPY SOZO SCREENING NOTE   Patient Name: Marcia Harvey MRN: [id number] DOB:Nov 26, 1945, 78 y.o., female Today's Date: 12/02/2023  PCP: [name], MD REFERRING PROVIDER: Vanderbilt Ned, MD   PT End of Session - 12/02/23 0940     Visit Number 6    Number of Visits 8    PT Start Time 0929    PT Stop Time 0940    PT Time Calculation (min) 11 min    Activity Tolerance Patient tolerated treatment well    Behavior During Therapy [facility] for tasks assessed/performed          Past Medical History:  Diagnosis Date   Anxiety    Anxiety    Arthritis    Cancer (HCC) 07/2020   SCC chest   Depression    Fall from horse 01/27/12   CT chest/Abd/Pelvis done at [facility]. Findings: Nondisplaced fx of posterior right 11th rib   GERD (gastroesophageal reflux disease)    Hypertension    Seizures (HCC)    been through testing, no issues with seizures or epilepsy   Syncopal episodes    seizure with episode   White matter disease    Past Surgical History:  Procedure Laterality Date   AXILLARY LYMPH NODE DISSECTION Right 09/04/2023   Procedure: REDGIE HARD;  Surgeon: Vanderbilt Ned, MD;  Location: [facility];  Service: General;  Laterality: Right;  GEN w/PEC BLOCK   BREAST LUMPECTOMY WITH RADIOACTIVE SEED LOCALIZATION Left 06/21/2020   Procedure: LEFT BREAST LUMPECTOMY WITH RADIOACTIVE SEED LOCALIZATION;  Surgeon: Belinda Cough, MD;  Location: [facility];  Service: General;  Laterality: Left;   BUNIONECTOMY Bilateral 1978   COLONOSCOPY     EYE SURGERY     MELANOMA EXCISION Right 08/02/2020   Procedure: 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;  Surgeon: Belinda Cough, MD;  Location: [facility];  Service: General;  Laterality: Right;  ROOM 8 STARTING AT 04:15PM FOR 45 MIN   SENTINEL NODE BIOPSY Right 09/04/2023   Procedure: BIOPSY, LYMPH NODE, SENTINEL;  Surgeon: Vanderbilt Ned, MD;   Location: [facility];  Service: General;  Laterality: Right;  SEED LOCALIZED RIGHT AXILLARY LYMPHNODE EXCIONSAL BIOPSY   Patient Active Problem List   Diagnosis Date Noted   Squamous cell carcinoma of skin 11/11/2023   Recurrent skin cancer 10/02/2023   Genetic testing 08/21/2023   Spondylosis without myelopathy or radiculopathy, cervical region 07/27/2019   Other intervertebral disc degeneration, lumbar region 07/27/2019   Fall from horse 01/27/2012    REFERRING DIAG: metastatic squamous cell carcinoma from a previous skin cancer on her shoulder   THERAPY DIAG:  Aftercare following surgery for neoplasm  PERTINENT HISTORY: Patient was diagnosed on 07/10/2023 with right axillary node metastatic carcinoma. 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   PRECAUTIONS: right UE Lymphedema risk  SUBJECTIVE: Here for SOZO screen - doing great; I finish radiation on this Thursday 12/05/2023  PAIN:  Are you having pain? No - some mild soreness in right axilla from radiation  SOZO SCREENING: Patient was assessed today using the SOZO machine to determine the lymphedema index score. This was compared to her baseline score. It was determined that she is within the recommended range when compared to her baseline and no further action is needed at this time. She will continue SOZO screenings. These are  done every 3 months for 2 years post operatively followed by every 6 months for 2 years, and then annually.   L-DEX FLOWSHEETS - 12/02/23 0900       L-DEX LYMPHEDEMA SCREENING   Measurement Type Unilateral    L-DEX MEASUREMENT EXTREMITY Upper Extremity    POSITION  Standing    DOMINANT SIDE Right    At Risk Side Right    BASELINE SCORE (UNILATERAL) 2.2    L-DEX SCORE (UNILATERAL) 4.9    VALUE CHANGE (UNILAT) 2.7         PHYSICAL THERAPY DISCHARGE  SUMMARY  Visits from Start of Care: 6  Current functional level related to goals / functional outcomes: Goals met; pt reports no functional deficits   Remaining deficits: None  UPPER EXTREMITY AROM/PROM:   A/PROM RIGHT   eval   RIGHT 10/03/2023 10/23/23 10/30/23  Shoulder extension 49 48 55 66  Shoulder flexion 156 132 156 157  Shoulder abduction 162 155 165 167  Shoulder internal rotation 63 64 80 85  Shoulder external rotation 86 90 95 95        LONG TERM GOALS:  (STG=LTG)   GOALS Name Target Date   Goal status  1 Patient will demonstrate proper technique with HEP to regain shoulder ROM while preventing neck pain flare up. 11/14/2023 MET  2 Patient will increase right shoulder active flexion to >/= 150 degrees to reach overhead without difficulty. 11/14/2023 MET  3 Patient will increase right shoulder active abduction to >/= 160 degrees to reach to the side and perform daily tasks without difficulty. 11/14/2023 MET  4 Patient will improve her DASH score to be </= 10 for improved overall UE function. 11/14/2023 MET 10/30/23 - 2/27%  5 Patient will report >/= 50% reduction in right upper arm pain to better tolerate wearing shirts and return to normal functional tasks. 11/14/2023 MET        Education / Equipment: HEP and lymphedema risk reduction   Patient agrees to discharge. Patient goals were met. Patient is being discharged due to meeting the stated rehab goals.  Eward Wonda Sharps, [place] 12/02/23 9:44 AM

## 2023-12-02 ENCOUNTER — Ambulatory Visit: Attending: Surgery | Admitting: Physical Therapy

## 2023-12-02 ENCOUNTER — Ambulatory Visit
Admission: RE | Admit: 2023-12-02 | Discharge: 2023-12-02 | Disposition: A | Discharge status: Home / Self Care | Source: Ambulatory Visit | Attending: Radiation Oncology | Admitting: Radiation Oncology

## 2023-12-02 ENCOUNTER — Encounter: Payer: Self-pay | Admitting: Physical Therapy

## 2023-12-02 ENCOUNTER — Other Ambulatory Visit: Payer: Self-pay

## 2023-12-02 DIAGNOSIS — Z483 Aftercare following surgery for neoplasm: Secondary | ICD-10-CM | POA: Insufficient documentation

## 2023-12-02 DIAGNOSIS — Z510 Encounter for antineoplastic radiation therapy: Secondary | ICD-10-CM | POA: Diagnosis not present

## 2023-12-02 LAB — RAD ONC ARIA SESSION SUMMARY
Course Elapsed Days: 35
Plan Fractions Treated to Date: 25
Plan Prescribed Dose Per Fraction: 1.8 Gy
Plan Total Fractions Prescribed: 28
Plan Total Prescribed Dose: 50.4 Gy
Reference Point Dosage Given to Date: 45 Gy
Reference Point Session Dosage Given: 1.8 Gy
Session Number: 25

## 2023-12-03 ENCOUNTER — Ambulatory Visit
Admission: RE | Admit: 2023-12-03 | Discharge: 2023-12-03 | Disposition: A | Discharge status: Home / Self Care | Source: Ambulatory Visit | Attending: Radiation Oncology | Admitting: Radiation Oncology

## 2023-12-03 ENCOUNTER — Telehealth: Payer: Self-pay | Admitting: Hematology and Oncology

## 2023-12-03 ENCOUNTER — Other Ambulatory Visit: Payer: Self-pay

## 2023-12-03 ENCOUNTER — Ambulatory Visit
Admission: RE | Admit: 2023-12-03 | Discharge: 2023-12-03 | Discharge status: Home / Self Care | Source: Ambulatory Visit | Attending: Radiation Oncology | Admitting: Radiation Oncology

## 2023-12-03 DIAGNOSIS — Z510 Encounter for antineoplastic radiation therapy: Secondary | ICD-10-CM | POA: Diagnosis not present

## 2023-12-03 LAB — RAD ONC ARIA SESSION SUMMARY
Course Elapsed Days: 36
Plan Fractions Treated to Date: 26
Plan Prescribed Dose Per Fraction: 1.8 Gy
Plan Total Fractions Prescribed: 28
Plan Total Prescribed Dose: 50.4 Gy
Reference Point Dosage Given to Date: 46.8 Gy
Reference Point Session Dosage Given: 1.8 Gy
Session Number: 26

## 2023-12-03 NOTE — Telephone Encounter (Signed)
 Left patient a vm regarding upcoming appointment

## 2023-12-04 ENCOUNTER — Ambulatory Visit
Admission: RE | Admit: 2023-12-04 | Discharge: 2023-12-04 | Disposition: A | Discharge status: Home / Self Care | Source: Ambulatory Visit | Attending: Radiation Oncology | Admitting: Radiation Oncology

## 2023-12-04 ENCOUNTER — Other Ambulatory Visit: Payer: Self-pay

## 2023-12-04 DIAGNOSIS — Z510 Encounter for antineoplastic radiation therapy: Secondary | ICD-10-CM | POA: Diagnosis not present

## 2023-12-04 LAB — RAD ONC ARIA SESSION SUMMARY
Course Elapsed Days: 37
Plan Fractions Treated to Date: 27
Plan Prescribed Dose Per Fraction: 1.8 Gy
Plan Total Fractions Prescribed: 28
Plan Total Prescribed Dose: 50.4 Gy
Reference Point Dosage Given to Date: 48.6 Gy
Reference Point Session Dosage Given: 1.8 Gy
Session Number: 27

## 2023-12-05 ENCOUNTER — Ambulatory Visit
Admission: RE | Admit: 2023-12-05 | Discharge: 2023-12-05 | Disposition: A | Discharge status: Home / Self Care | Source: Ambulatory Visit | Attending: Radiation Oncology | Admitting: Radiation Oncology

## 2023-12-05 ENCOUNTER — Other Ambulatory Visit: Payer: Self-pay

## 2023-12-05 DIAGNOSIS — Z510 Encounter for antineoplastic radiation therapy: Secondary | ICD-10-CM | POA: Diagnosis not present

## 2023-12-05 LAB — RAD ONC ARIA SESSION SUMMARY
Course Elapsed Days: 38
Plan Fractions Treated to Date: 28
Plan Prescribed Dose Per Fraction: 1.8 Gy
Plan Total Fractions Prescribed: 28
Plan Total Prescribed Dose: 50.4 Gy
Reference Point Dosage Given to Date: 50.4 Gy
Reference Point Session Dosage Given: 1.8 Gy
Session Number: 28

## 2023-12-06 ENCOUNTER — Other Ambulatory Visit: Payer: Self-pay | Admitting: *Deleted

## 2023-12-06 DIAGNOSIS — C4492 Squamous cell carcinoma of skin, unspecified: Secondary | ICD-10-CM

## 2023-12-06 MED ORDER — ONDANSETRON HCL 8 MG PO TABS: 8.0000 mg | ORAL_TABLET | Freq: Three times a day (TID) | ORAL | 1 refills | Status: DC | PRN

## 2023-12-06 NOTE — Radiation Completion Notes (Addendum)
  Radiation Oncology         [phone] ________________________________  Name: Marcia Harvey MRN: [id number]  Date of Service: 12/05/2023  DOB: Feb 14, 1946  End of Treatment Note  Diagnosis: Recurrent squamous cell carcinoma of skin with metastasis to the right axillary region Intent: Curative     ==========DELIVERED PLANS==========  First Treatment Date: 2023-10-28 Last Treatment Date: 2023-12-05   Plan Name: Axilla_R Site: Axilla, Right Technique: 3D Mode: Photon Dose Per Fraction: 1.8 Gy Prescribed Dose (Delivered / Prescribed): 50.4 Gy / 50.4 Gy Prescribed Fxs (Delivered / Prescribed): 28 / 28     ====================================   The patient tolerated radiation. She developed anticipated skin changes in the treatment field.   The patient will return in one month and will continue follow up with Dr. Loretha as well.      Ronita Due, PA-C

## 2023-12-09 ENCOUNTER — Inpatient Hospital Stay: Admitting: Pharmacist

## 2023-12-09 ENCOUNTER — Inpatient Hospital Stay: Attending: Adult Health

## 2023-12-09 DIAGNOSIS — C4492 Squamous cell carcinoma of skin, unspecified: Secondary | ICD-10-CM

## 2023-12-09 DIAGNOSIS — C4499 Other specified malignant neoplasm of skin, unspecified: Secondary | ICD-10-CM

## 2023-12-11 NOTE — Progress Notes (Signed)
 Pharmacist Chemotherapy Monitoring - Initial Assessment    Anticipated start date: 12/20/23   The following has been reviewed per standard work regarding the patient's treatment regimen: The patient's diagnosis, treatment plan and drug doses, and organ/hematologic function Lab orders and baseline tests specific to treatment regimen  The treatment plan start date, drug sequencing, and pre-medications Prior authorization status  Patient's documented medication list, including drug-drug interaction screen and prescriptions for anti-emetics and supportive care specific to the treatment regimen The drug concentrations, fluid compatibility, administration routes, and timing of the medications to be used The patient's access for treatment and lifetime cumulative dose history, if applicable  The patient's medication allergies and previous infusion related reactions, if applicable   Changes made to treatment plan:  N/A  Follow up needed:  N/A   Marcia Harvey, PharmD, MBA

## 2023-12-20 ENCOUNTER — Inpatient Hospital Stay (HOSPITAL_BASED_OUTPATIENT_CLINIC_OR_DEPARTMENT_OTHER): Admitting: Adult Health

## 2023-12-20 ENCOUNTER — Inpatient Hospital Stay

## 2023-12-20 ENCOUNTER — Other Ambulatory Visit

## 2023-12-20 ENCOUNTER — Ambulatory Visit: Admitting: Hematology and Oncology

## 2023-12-20 ENCOUNTER — Inpatient Hospital Stay: Attending: Hematology and Oncology

## 2023-12-20 ENCOUNTER — Ambulatory Visit

## 2023-12-20 VITALS — BP 135/86 | HR 80 | Temp 98.7°F | Resp 15 | Wt 174.5 lb

## 2023-12-20 DIAGNOSIS — Z87891 Personal history of nicotine dependence: Secondary | ICD-10-CM | POA: Diagnosis not present

## 2023-12-20 DIAGNOSIS — C4492 Squamous cell carcinoma of skin, unspecified: Secondary | ICD-10-CM

## 2023-12-20 DIAGNOSIS — Z79620 Long term (current) use of immunosuppressive biologic: Secondary | ICD-10-CM | POA: Diagnosis not present

## 2023-12-20 DIAGNOSIS — Z923 Personal history of irradiation: Secondary | ICD-10-CM | POA: Insufficient documentation

## 2023-12-20 DIAGNOSIS — Z171 Estrogen receptor negative status [ER-]: Secondary | ICD-10-CM | POA: Diagnosis not present

## 2023-12-20 DIAGNOSIS — C50611 Malignant neoplasm of axillary tail of right female breast: Secondary | ICD-10-CM | POA: Insufficient documentation

## 2023-12-20 DIAGNOSIS — Z85828 Personal history of other malignant neoplasm of skin: Secondary | ICD-10-CM | POA: Insufficient documentation

## 2023-12-20 DIAGNOSIS — Z5111 Encounter for antineoplastic chemotherapy: Secondary | ICD-10-CM | POA: Diagnosis present

## 2023-12-20 LAB — CMP (CANCER CENTER ONLY)
ALT: 10 U/L (ref 0–44)
AST: 25 U/L (ref 15–41)
Albumin: 4.2 g/dL (ref 3.5–5.0)
Alkaline Phosphatase: 126 U/L (ref 38–126)
Anion gap: 7 (ref 5–15)
BUN: 18 mg/dL (ref 8–23)
CO2: 26 mmol/L (ref 22–32)
Calcium: 9.3 mg/dL (ref 8.9–10.3)
Chloride: 108 mmol/L (ref 98–111)
Creatinine: 0.74 mg/dL (ref 0.44–1.00)
GFR, Estimated: >60 mL/min (ref >60)
Glucose, Bld: 84 mg/dL (ref 70–99)
Potassium: 4 mmol/L (ref 3.5–5.1)
Sodium: 141 mmol/L (ref 135–145)
Total Bilirubin: 0.4 mg/dL (ref 0.0–1.2)
Total Protein: 7.3 g/dL (ref 6.5–8.1)

## 2023-12-20 LAB — CBC WITH DIFFERENTIAL (CANCER CENTER ONLY)
Abs Immature Granulocytes: 0.02 K/uL (ref 0.00–0.07)
Basophils Absolute: 0 K/uL (ref 0.0–0.1)
Basophils Relative: 1 %
Eosinophils Absolute: 0 K/uL (ref 0.0–0.5)
Eosinophils Relative: 1 %
HCT: 40.3 % (ref 36.0–46.0)
Hemoglobin: 13.3 g/dL (ref 12.0–15.0)
Immature Granulocytes: 1 %
Lymphocytes Relative: 20 %
Lymphs Abs: 0.9 K/uL (ref 0.7–4.0)
MCH: 26.9 pg (ref 26.0–34.0)
MCHC: 33 g/dL (ref 30.0–36.0)
MCV: 81.4 fL (ref 80.0–100.0)
Monocytes Absolute: 0.6 K/uL (ref 0.1–1.0)
Monocytes Relative: 14 %
Neutro Abs: 2.7 K/uL (ref 1.7–7.7)
Neutrophils Relative %: 63 %
Platelet Count: 273 K/uL (ref 150–400)
RBC: 4.95 MIL/uL (ref 3.87–5.11)
RDW: 13.7 % (ref 11.5–15.5)
WBC Count: 4.3 K/uL (ref 4.0–10.5)
nRBC: 0 % (ref 0.0–0.2)

## 2023-12-20 LAB — TSH: TSH: 1.65 u[IU]/mL (ref 0.350–4.500)

## 2023-12-20 MED ORDER — SODIUM CHLORIDE 0.9 % IV SOLN
350.0000 mg | Freq: Once | INTRAVENOUS | Status: AC
  Administered 2023-12-20: 350 mg via INTRAVENOUS
  Filled 2023-12-20: qty 7

## 2023-12-20 MED ORDER — SODIUM CHLORIDE 0.9% FLUSH: 10.0000 mL | INTRAVENOUS | Status: DC | PRN

## 2023-12-20 MED ORDER — SODIUM CHLORIDE 0.9 % IV SOLN: INTRAVENOUS | Status: DC

## 2023-12-20 NOTE — Patient Instructions (Addendum)
 [facility] - A DEPT OF [facility]  Discharge Instructions: Thank you for choosing [facility] to provide your oncology and hematology care.   If you have a lab appointment with the Cancer Center, please go directly to the Cancer Center and check in at the registration area.   Wear comfortable clothing and clothing appropriate for easy access to any Portacath or PICC line.   We strive to give you quality time with your provider. You may need to reschedule your appointment if you arrive late (15 or more minutes).  Arriving late affects you and other patients whose appointments are after yours.  Also, if you miss three or more appointments without notifying the office, you may be dismissed from the clinic at the provider's discretion.      For prescription refill requests, have your pharmacy contact our office and allow 72 hours for refills to be completed.    Today you received the following chemotherapy and/or immunotherapy agents: Libtayo       To help prevent nausea and vomiting after your treatment, we encourage you to take your nausea medication as directed.  BELOW ARE SYMPTOMS THAT SHOULD BE REPORTED IMMEDIATELY: *FEVER GREATER THAN 100.4 F (38 C) OR HIGHER *CHILLS OR SWEATING *NAUSEA AND VOMITING THAT IS NOT CONTROLLED WITH YOUR NAUSEA MEDICATION *UNUSUAL SHORTNESS OF BREATH *UNUSUAL BRUISING OR BLEEDING *URINARY PROBLEMS (pain or burning when urinating, or frequent urination) *BOWEL PROBLEMS (unusual diarrhea, constipation, pain near the anus) TENDERNESS IN MOUTH AND THROAT WITH OR WITHOUT PRESENCE OF ULCERS (sore throat, sores in mouth, or a toothache) UNUSUAL RASH, SWELLING OR PAIN  UNUSUAL VAGINAL DISCHARGE OR ITCHING   Items with * indicate a potential emergency and should be followed up as soon as possible or go to the Emergency Department if any problems should occur.  Please show the CHEMOTHERAPY ALERT CARD or IMMUNOTHERAPY  ALERT CARD at check-in to the Emergency Department and triage nurse.  Should you have questions after your visit or need to cancel or reschedule your appointment, please contact [facility] - A DEPT OF [facility]  Dept: [phone]  and follow the prompts.  Office hours are 8:00 a.m. to 4:30 p.m. Monday - Friday. Please note that voicemails left after 4:00 p.m. may not be returned until the following business day.  We are closed weekends and major holidays. You have access to a nurse at all times for urgent questions. Please call the main number to the clinic Dept: [phone] and follow the prompts.   For any non-urgent questions, you may also contact your provider using MyChart. We now offer e-Visits for anyone 43 and older to request care online for non-urgent symptoms. For details visit [email or web address].   Also download the MyChart app! Go to the app store, search MyChart, open the app, select , and log in with your MyChart username and password.  Cemiplimab  Injection What is this medication? CEMIPLIMAB  (se MIP li mab) treats skin cancer and lung cancer. It works by helping your immune system slow or stop the spread of cancer cells. It is a monoclonal antibody. This medicine may be used for other purposes; ask your health care provider or pharmacist if you have questions. COMMON BRAND NAME(S): LIBTAYO  What should I tell my care team before I take this medication? They need to know if you have any of these conditions: Allogeneic stem cell transplant (uses someone else's stem cells)  Autoimmune diseases, such as Crohn disease, ulcerative colitis, lupus History of chest radiation Nervous system problems, such as Guillain-Barre syndrome or myasthenia gravis Organ transplant An unusual or allergic reaction to cemiplimab , other medications, foods, dyes, or preservatives Pregnant or trying to get pregnant Breastfeeding How should I use this  medication? This medication is infused into a vein. It is given by your care team in a hospital or clinic setting. A special MedGuide will be given to you before each treatment. Be sure to read this information carefully each time. Talk to your care team about the use of this medication in children. Special care may be needed. Overdosage: If you think you have taken too much of this medicine contact a poison control center or emergency room at once. NOTE: This medicine is only for you. Do not share this medicine with others. What if I miss a dose? Keep appointments for follow-up doses. It is important not to miss your dose. Call your care team if you are unable to keep an appointment. What may interact with this medication? Interactions have not been studied. This list may not describe all possible interactions. Give your health care provider a list of all the medicines, herbs, non-prescription drugs, or dietary supplements you use. Also tell them if you smoke, drink alcohol, or use illegal drugs. Some items may interact with your medicine. What should I watch for while using this medication? Visit your care team for regular checks on your progress. It may be some time before you see the benefit from this medication. You may need blood work done while taking this medication. This medication may cause serious skin reactions. They can happen weeks to months after starting the medication. Contact your care team right away if you notice fevers or flu-like symptoms with a rash. The rash may be red or purple and then turn into blisters or peeling of the skin. You may also notice a red rash with swelling of the face, lips, or lymph nodes in your neck or under your arms. Tell your care team right away if you have any change in your eyesight. Talk to your care team if you may be pregnant. You will need a negative pregnancy test before starting this medication. Contraception is recommended while taking this  medication and for 4 months after the last dose. Your care team can help you find the option that works for you. Do not breastfeed while taking this medication and for at least 4 months after the last dose. What side effects may I notice from receiving this medication? Side effects that you should report to your care team as soon as possible: Allergic reactions--skin rash, itching, hives, swelling of the face, lips, tongue, or throat Dry cough, shortness of breath or trouble breathing Eye pain, redness, irritation, or discharge with blurry or decreased vision Heart muscle inflammation--unusual weakness or fatigue, shortness of breath, chest pain, fast or irregular heartbeat, dizziness, swelling of the ankles, feet, or hands Hormone gland problems--headache, sensitivity to light, unusual weakness or fatigue, dizziness, fast or irregular heartbeat, increased sensitivity to cold or heat, excessive sweating, constipation, hair loss, increased thirst or amount of urine, tremors or shaking, irritability Infusion reactions--chest pain, shortness of breath or trouble breathing, feeling faint or lightheaded Kidney injury (glomerulonephritis)--decrease in the amount of urine, red or dark brown urine, foamy or bubbly urine, swelling of the ankles, hands, or feet Liver injury--right upper belly pain, loss of appetite, nausea, light-colored stool, dark yellow or brown urine, yellowing skin  or eyes, unusual weakness or fatigue Pain, tingling, or numbness in the hands or feet, muscle weakness, change in vision, confusion or trouble speaking, loss of balance or coordination, trouble walking, seizures Rash, fever, and swollen lymph nodes Redness, blistering, peeling, or loosening of the skin, including inside the mouth Sudden or severe stomach pain, bloody diarrhea, fever, nausea, vomiting Side effects that usually do not require medical attention (report these to your care team if they continue or are  bothersome): Bone, joint, or muscle pain Diarrhea Fatigue Loss of appetite Nausea Skin rash This list may not describe all possible side effects. Call your doctor for medical advice about side effects. You may report side effects to FDA at 1-800-FDA-1088. Where should I keep my medication? This medication is given in a hospital or clinic and will not be stored at home. NOTE: This sheet is a summary. It may not cover all possible information. If you have questions about this medicine, talk to your doctor, pharmacist, or health care provider.  2024 Elsevier/Gold Standard (2022-06-18 00:00:00)

## 2023-12-20 NOTE — Progress Notes (Signed)
 [facility] Cancer Follow up:    [name], MD [address] [place] [address]   DIAGNOSIS:  Cancer Staging  No matching staging information was found for the patient.    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, estrogen receptor negative (HCC)   07/31/2023 Cancer Staging   Staging form: Breast, AJCC 8th Edition - Clinical stage from 07/31/2023: Stage Unknown (cTX, cN1, cM0, ER-, PR-, HER2-) - Signed by Loretha Ash, MD on 07/31/2023 Stage prefix: Initial diagnosis Laterality: Right Staged by: Pathologist and managing physician Stage used in treatment planning: Yes National guidelines used in treatment planning: Yes Type of national guideline used in treatment planning: NCCN   08/12/2023 Genetic Testing   Single, 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  Report date is 08/12/2023.   The CancerNext-Expanded gene panel offered by [facility] and includes sequencing, rearrangement, and RNA analysis for the following 76 genes: AIP, ALK, APC, ATM, AXIN2, BAP1, BARD1, BMPR1A, BRCA1, BRCA2, BRIP1, CDC73, CDH1, CDK4, CDKN1B, CDKN2A, CEBPA, CHEK2, CTNNA1, DDX41, DICER1, ETV6, FH, FLCN, GATA2, LZTR1, MAX, MBD4, MEN1, MET, MLH1, MSH2, MSH3, MSH6, MUTYH, NF1, NF2, NTHL1, PALB2, PHOX2B, PMS2, POT1, PRKAR1A, PTCH1, PTEN, RAD51C, RAD51D, RB1,  RET, RUNX1, SDHA, SDHAF2, SDHB, SDHC, SDHD, SMAD4, SMARCA4, SMARCB1, SMARCE1, STK11, SUFU, TMEM127, TP53, TSC1, TSC2, VHL, and WT1 (sequencing and deletion/duplication); EGFR, HOXB13, KIT, MITF, PDGFRA, POLD1, and POLE (sequencing only); EPCAM and GREM1 (deletion/duplication only).    Squamous cell carcinoma of skin  10/28/2023 - 12/05/2023 Radiation Therapy   First Treatment Date: 2023-10-28 Last Treatment Date: 2023-12-05   Plan Name: Axilla_R Site: Axilla, Right Technique: 3D Mode: Photon Dose Per Fraction: 1.8 Gy Prescribed Dose (Delivered / Prescribed): 50.4 Gy / 50.4 Gy Prescribed Fxs (Delivered / Prescribed): 28 / 28   11/11/2023 Initial Diagnosis   Squamous cell carcinoma of skin   12/20/2023 -  Chemotherapy   Patient is on Treatment Plan : ADVANCED CUTANEOUS SQUAMOUS CELL CARCINOMA Cemiplimab  q21d       CURRENT THERAPY: Cemiplimab   INTERVAL HISTORY:  Discussed the use of AI scribe software for clinical note transcription with the patient, who gave verbal consent to proceed.  History of Present Illness Marcia Harvey is a 78 year old female with squamous cell carcinoma of the skin who presents for her first cycle of cemiplimab  therapy.  She recently completed radiation therapy and is starting cemiplimab  today, an IG4 monoclonal antibody to PD1. Her skin has started peeling since undergoing radiation therapy, and she uses lotion and Neosporin at home. She is concerned about potential side effects from cemiplimab .  A mass was previously discovered during a mammogram, leading to an ultrasound and biopsy, with a squamous cell carcinoma recurrence noted in the lymph node under her armpit. She had a squamous cell carcinoma removed three years ago and attends dermatology follow-ups every six months.  She experiences vasovagal responses to needles and sometimes requires a reclined position  for procedures. She is considering a port for her treatments, depending on her experience with  IVs.     Patient Active Problem List   Diagnosis Date Noted   Squamous cell carcinoma of skin 11/11/2023   Recurrent skin cancer 10/02/2023   Genetic testing 08/21/2023   Spondylosis without myelopathy or radiculopathy, cervical region 07/27/2019   Other intervertebral disc degeneration, lumbar region 07/27/2019   Fall from horse 01/27/2012    is allergic to codeine, iodinated contrast media, iohexol, shellfish-derived products, bee pollen, iodine, wound dressing adhesive, and tape.  MEDICAL HISTORY: Past Medical History:  Diagnosis Date   Anxiety    Anxiety    Arthritis    Cancer (HCC) 07/2020   SCC chest   Depression    Fall from horse 01/27/2012   CT chest/Abd/Pelvis done at [facility]. Findings: Nondisplaced fx of posterior right 11th rib   GERD (gastroesophageal reflux disease)    Hypertension    Seizures (HCC)    been through testing, no issues with seizures or epilepsy   Syncopal episodes    seizure with episode   White matter disease     SURGICAL HISTORY: Past Surgical History:  Procedure Laterality Date   AXILLARY LYMPH NODE DISSECTION Right 09/04/2023   Procedure: REDGIE HARD;  Surgeon: Vanderbilt Ned, MD;  Location: [facility];  Service: General;  Laterality: Right;  GEN w/PEC BLOCK   BREAST LUMPECTOMY WITH RADIOACTIVE SEED LOCALIZATION Left 06/21/2020   Procedure: LEFT BREAST LUMPECTOMY WITH RADIOACTIVE SEED LOCALIZATION;  Surgeon: Belinda Cough, MD;  Location: [facility];  Service: General;  Laterality: Left;   BUNIONECTOMY Bilateral 1978   COLONOSCOPY     EYE SURGERY     MELANOMA EXCISION Right 08/02/2020   Procedure: 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;  Surgeon: Belinda Cough, MD;  Location: [facility];  Service: General;  Laterality: Right;  ROOM 8 STARTING AT 04:15PM FOR 45 MIN   SENTINEL NODE BIOPSY Right 09/04/2023   Procedure: BIOPSY, LYMPH NODE, SENTINEL;  Surgeon:  Vanderbilt Ned, MD;  Location: [facility];  Service: General;  Laterality: Right;  SEED LOCALIZED RIGHT AXILLARY LYMPHNODE EXCIONSAL BIOPSY    SOCIAL HISTORY: Social History   Socioeconomic History   Marital status: Single    Spouse name: Not on file   Number of children: Not on file   Years of education: Not on file   Highest education level: Not on file  Occupational History   Not on file  Tobacco Use   Smoking status: Former    Current packs/day: 0.00    Types: Cigarettes    Quit date: 05/21/2003    Years since quitting: 20.6   Smokeless tobacco: Never  Vaping Use   Vaping status: Never Used  Substance and Sexual Activity   Alcohol use: Not Currently    Comment: 3-4x per year   Drug use: No   Sexual activity: Not Currently    Birth control/protection: Post-menopausal  Other Topics Concern   Not on file  Social History Narrative   Are you right handed or left handed? right   Are you currently employed ? no   What is your current occupation? Retired    Do you live at home alone? Alone    Who lives with you? NA    What type of home do you live in: 1 story or 2 story?  1 story        Social Drivers of Health  Financial Resource Strain: Low Risk  (07/22/2023)   Received from [facility]   Overall Financial Resource Strain (CARDIA)    Difficulty of Paying Living Expenses: Not very hard  Food Insecurity: No Food Insecurity (10/02/2023)   Hunger Vital Sign    Worried About Running Out of Food in the Last Year: Never true    Ran Out of Food in the Last Year: Never true  Transportation Needs: No Transportation Needs (10/02/2023)   PRAPARE - Administrator, Civil Service (Medical): No    Lack of Transportation (Non-Medical): No  Physical Activity: Sufficiently Active (07/22/2023)   Received from [facility]   Exercise Vital Sign    On average, how many days per week do you engage in moderate to strenuous exercise (like a brisk walk)?: 5 days     On average, how many minutes do you engage in exercise at this level?: 60 min  Stress: Stress Concern Present (07/22/2023)   Received from [facility] of Occupational Health - Occupational Stress Questionnaire    Feeling of Stress : To some extent  Social Connections: Socially Integrated (07/22/2023)   Received from [facility]   Social Network    How would you rate your social network (family, work, friends)?: Good participation with social networks  Intimate Partner Violence: Not At Risk (10/02/2023)   Humiliation, Afraid, Rape, and Kick questionnaire    Fear of Current or Ex-Partner: No    Emotionally Abused: No    Physically Abused: No    Sexually Abused: No    FAMILY HISTORY: Family History  Problem Relation Age of Onset   Colon cancer Mother 29       d. 83   Prostate cancer Father 65   Lung cancer Maternal Uncle        dx >50   Cancer Paternal Aunt        unknown type; mets; d. >50   Diabetes Maternal Grandfather    Diabetes Paternal Grandmother     Review of Systems  Constitutional:  Negative for appetite change, chills, fatigue, fever and unexpected weight change.  HENT:   Negative for hearing loss, lump/mass and trouble swallowing.   Eyes:  Negative for eye problems and icterus.  Respiratory:  Negative for chest tightness, cough and shortness of breath.   Cardiovascular:  Negative for chest pain, leg swelling and palpitations.  Gastrointestinal:  Negative for abdominal distention, abdominal pain, constipation, diarrhea, nausea and vomiting.  Endocrine: Negative for hot flashes.  Genitourinary:  Negative for difficulty urinating.   Musculoskeletal:  Negative for arthralgias.  Skin:  Negative for itching and rash.  Neurological:  Negative for dizziness, extremity weakness, headaches and numbness.  Hematological:  Negative for adenopathy. Does not bruise/bleed easily.  Psychiatric/Behavioral:  Negative for depression. The patient is not  nervous/anxious.       PHYSICAL EXAMINATION     Vitals:   12/20/23 0918  BP: 135/86  Pulse: 80  Resp: 15  Temp: 98.7 F (37.1 C)  SpO2: 96%    Physical Exam Constitutional:      General: She is not in acute distress.    Appearance: Normal appearance. She is not toxic-appearing.  HENT:     Head: Normocephalic and atraumatic.     Mouth/Throat:     Mouth: Mucous membranes are moist.     Pharynx: Oropharynx is clear. No oropharyngeal exudate or posterior oropharyngeal erythema.  Eyes:     General: No scleral icterus. Cardiovascular:  Rate and Rhythm: Normal rate and regular rhythm.     Pulses: Normal pulses.     Heart sounds: Normal heart sounds.  Pulmonary:     Effort: Pulmonary effort is normal.     Breath sounds: Normal breath sounds.  Abdominal:     General: Abdomen is flat. Bowel sounds are normal. There is no distension.     Palpations: Abdomen is soft.     Tenderness: There is no abdominal tenderness.  Musculoskeletal:        General: No swelling.     Cervical back: Neck supple.  Lymphadenopathy:     Cervical: No cervical adenopathy.  Skin:    General: Skin is warm and dry.     Findings: No rash.     Comments: Right axilla healing well, mild desquamation that is healing, no signs of infection  Neurological:     General: No focal deficit present.     Mental Status: She is alert.  Psychiatric:        Mood and Affect: Mood normal.        Behavior: Behavior normal.     LABORATORY DATA:  CBC    Component Value Date/Time   WBC 4.3 12/20/2023 0856   WBC 8.6 06/07/2014 2230   RBC 4.95 12/20/2023 0856   HGB 13.3 12/20/2023 0856   HCT 40.3 12/20/2023 0856   PLT 273 12/20/2023 0856   MCV 81.4 12/20/2023 0856   MCH 26.9 12/20/2023 0856   MCHC 33.0 12/20/2023 0856   RDW 13.7 12/20/2023 0856   LYMPHSABS 0.9 12/20/2023 0856   MONOABS 0.6 12/20/2023 0856   EOSABS 0.0 12/20/2023 0856   BASOSABS 0.0 12/20/2023 0856    CMP     Component Value  Date/Time   NA 141 12/20/2023 0856   K 4.0 12/20/2023 0856   CL 108 12/20/2023 0856   CO2 26 12/20/2023 0856   GLUCOSE 84 12/20/2023 0856   BUN 18 12/20/2023 0856   CREATININE 0.74 12/20/2023 0856   CALCIUM 9.3 12/20/2023 0856   PROT 7.3 12/20/2023 0856   ALBUMIN 4.2 12/20/2023 0856   AST 25 12/20/2023 0856   ALT 10 12/20/2023 0856   ALKPHOS 126 12/20/2023 0856   BILITOT 0.4 12/20/2023 0856   GFRNONAA >60 12/20/2023 0856   GFRAA 43 (L) 06/07/2014 2230     ASSESSMENT and THERAPY PLAN:  Assessment and Plan Assessment & Plan 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 PET scan showed no metastasis. - Administer cemiplimab  for six cycles from August 8 to April 03, 2024. - Monitor thyroid function with labs due to cemiplimab . - Advise preemptive nausea medication if needed. - Follow up with dermatology per their recommendations.  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 - Use Neosporin for skin care as needed.  Vasovagal response to needle procedures Vasovagal response to needle procedures, considering port placement based on future IV access experiences. - Use recliner during needle procedures. - Consider port placement if IV access is challenging, arrange with Dr. Cornette if needed.  RTC in 3 weeks for labs, f/u, and her next treatment.       All questions were answered. The patient knows to call the clinic with any problems, questions or concerns. We can certainly see the patient much sooner if necessary.  Total encounter time:20 minutes*in face-to-face visit time,  chart review, lab review, care coordination, order entry, and documentation of the encounter time.    Morna Kendall, NP 12/22/23 1:46 PM Medical  Oncology and Hematology [facility] [address] [place], [place] [address] Tel. [phone]    Fax. [phone]  *Total Encounter Time as defined by the Centers for Medicare and Medicaid Services includes, in addition to the face-to-face time of a patient visit (documented in the note above) non-face-to-face time: obtaining and reviewing outside history, ordering and reviewing medications, tests or procedures, care coordination (communications with other health care professionals or caregivers) and documentation in the medical record.

## 2023-12-21 LAB — T4: T4, Total: 8 ug/dL (ref 4.5–12.0)

## 2023-12-22 ENCOUNTER — Encounter: Payer: Self-pay | Admitting: Hematology and Oncology

## 2024-01-01 ENCOUNTER — Encounter: Payer: Self-pay | Admitting: Radiation Oncology

## 2024-01-01 NOTE — Progress Notes (Signed)
 Radiation Oncology         [phone] ________________________________  Name: Marcia Harvey MRN: [id number]  Date: 01/02/2024  DOB: 1945-09-04  Follow-Up Visit Note  CC: [name], MD  [name], MD  No diagnosis found.  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 She was last seen in office on 10/02/23 for a consultation visit. Since then, patient completed her radiation treatment which she tolerated quite well. Patient did however endorse experiencing anticipated skin changes in the treatment field for which she is using Neosporin for.   In the interval since she was last seen, she presented for a follow up visit with Dr. Loretha on 11/11/23 during which they discussed starting adjuvant cemiplimab  which is to be initiated in August.   She also presented for a follow up with NP Causey on 12/20/23 to initiate her first cycle of cemiplimab  which will be administered for six cycles from August 8 to April 03, 2024.      No other significant oncologic interval history since the patient was last seen.                                 Allergies:  is allergic to codeine, iodinated contrast media, iohexol, shellfish-derived products, bee pollen, iodine, wound dressing adhesive, and tape.  Meds: Current Outpatient Medications  Medication Sig Dispense Refill   ascorbic acid (VITAMIN C) 500 MG tablet Take by mouth.     atorvastatin (LIPITOR) 40 MG  tablet Take 40 mg by mouth daily.     augmented betamethasone dipropionate (DIPROLENE-AF) 0.05 % cream Apply topically 2 (two) times daily.     b complex vitamins tablet Take 1 tablet by mouth daily.     carboxymethylcellul-glycerin (OPTIVE) 0.5-0.9 % ophthalmic solution Apply to eye.     Cholecalciferol (VITAMIN D3) 2000 UNITS TABS Take 2,000 Units by mouth daily.     gabapentin  (NEURONTIN ) 300 MG capsule Take 300 mg by mouth 3 (three) times daily. Ordered by Dr. Debby Shipper for nerve pain post op. She said he told her to take 2 at a time. (Patient not taking: Reported on 12/20/2023)     lisinopril -hydrochlorothiazide  (PRINZIDE ,ZESTORETIC ) 10-12.5 MG per tablet Take 1 tablet by mouth daily. NEED VISIT, LABS!!  2nd NOTICE! 30 tablet 0   omeprazole (PRILOSEC) 40 MG capsule TAKE 1 CAPSULE BY MOUTH EVERY DAY     ondansetron  (ZOFRAN ) 8 MG tablet Take 1 tablet (8 mg total) by mouth every 8 (eight) hours as needed for nausea or vomiting. 30 tablet 1   sertraline (ZOLOFT) 25 MG tablet TAKE 1 TABLET(25 MG) BY MOUTH DAILY     traZODone (DESYREL) 50 MG tablet 25 to 50 mg at bedtime to help with sleep     No current facility-administered medications for this encounter.    Physical Findings: The patient is in no acute distress. Patient is alert and oriented.  vitals were not  taken for this visit. .  No significant changes. Lungs are clear to auscultation bilaterally. Heart has regular rate and rhythm. No palpable cervical, supraclavicular, or axillary adenopathy. Abdomen soft, non-tender, normal bowel sounds.   Lab Findings: Lab Results  Component Value Date   WBC 4.3 12/20/2023   HGB 13.3 12/20/2023   HCT 40.3 12/20/2023   MCV 81.4 12/20/2023   PLT 273 12/20/2023    Radiographic Findings: No results found.  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  ***  Plan:  ***   *** minutes of total time was spent for this patient encounter, including preparation, face-to-face counseling with the patient and coordination of care, physical exam, and documentation of the encounter. ____________________________________  Lynwood CHARM Nasuti, PhD, MD  This document serves as a record of services personally performed by Lynwood Nasuti, MD. It was created on his behalf by Reymundo Cartwright, a trained medical scribe. The creation of this record is based on the scribe's personal observations and the provider's statements to them. This document has been checked and approved by the attending provider.

## 2024-01-02 ENCOUNTER — Ambulatory Visit
Admission: RE | Admit: 2024-01-02 | Discharge: 2024-01-02 | Disposition: A | Discharge status: Home / Self Care | Source: Ambulatory Visit | Attending: Radiation Oncology | Admitting: Radiation Oncology

## 2024-01-02 ENCOUNTER — Encounter: Payer: Self-pay | Admitting: Radiation Oncology

## 2024-01-02 VITALS — BP 142/85 | HR 78 | Temp 97.1°F | Resp 18 | Wt 172.4 lb

## 2024-01-02 DIAGNOSIS — C4492 Squamous cell carcinoma of skin, unspecified: Secondary | ICD-10-CM | POA: Diagnosis present

## 2024-01-02 DIAGNOSIS — Z79899 Other long term (current) drug therapy: Secondary | ICD-10-CM | POA: Diagnosis not present

## 2024-01-02 DIAGNOSIS — Z923 Personal history of irradiation: Secondary | ICD-10-CM | POA: Diagnosis not present

## 2024-01-02 HISTORY — DX: Personal history of irradiation: Z92.3

## 2024-01-02 NOTE — Progress Notes (Signed)
 BRINDA FOCHT is here today for follow up post radiation to the right axilla     They completed their radiation on: 12/05/23   Does the patient complain of any of the following: Post radiation skin issues: Skin has improved. Tenderness: 5/10 Pain  it feels like something is grinding. Swelling: Denies Lymphadema: Denies Range of Motion limitations: Denies Fatigue post radiation: Mild  Appetite good/fair/poor: Good  Additional comments if applicable:   BP (!) (P) 142/85 (BP Location: Left Arm, Patient Position: Sitting)   Pulse (P) 78   Temp (!) (P) 97.1 F (36.2 C) (Temporal)   Resp (P) 18   Ht (P) 5' 3 (1.6 m)   Wt (P) 172 lb 4 oz (78.1 kg)   SpO2 (P) 97%   BMI (P) 30.51 kg/m

## 2024-01-10 ENCOUNTER — Inpatient Hospital Stay

## 2024-01-10 ENCOUNTER — Inpatient Hospital Stay (HOSPITAL_BASED_OUTPATIENT_CLINIC_OR_DEPARTMENT_OTHER): Admitting: Hematology and Oncology

## 2024-01-10 VITALS — BP 134/81 | HR 74 | Temp 98.7°F | Resp 17 | Wt 173.3 lb

## 2024-01-10 DIAGNOSIS — C4492 Squamous cell carcinoma of skin, unspecified: Secondary | ICD-10-CM

## 2024-01-10 DIAGNOSIS — Z5111 Encounter for antineoplastic chemotherapy: Secondary | ICD-10-CM | POA: Diagnosis not present

## 2024-01-10 LAB — CMP (CANCER CENTER ONLY)
ALT: 10 U/L (ref 0–44)
AST: 29 U/L (ref 15–41)
Albumin: 4 g/dL (ref 3.5–5.0)
Alkaline Phosphatase: 130 U/L — ABNORMAL HIGH (ref 38–126)
Anion gap: 8 (ref 5–15)
BUN: 30 mg/dL — ABNORMAL HIGH (ref 8–23)
CO2: 26 mmol/L (ref 22–32)
Calcium: 9.3 mg/dL (ref 8.9–10.3)
Chloride: 106 mmol/L (ref 98–111)
Creatinine: 0.84 mg/dL (ref 0.44–1.00)
GFR, Estimated: >60 mL/min (ref >60)
Glucose, Bld: 89 mg/dL (ref 70–99)
Potassium: 4.3 mmol/L (ref 3.5–5.1)
Sodium: 140 mmol/L (ref 135–145)
Total Bilirubin: 0.4 mg/dL (ref 0.0–1.2)
Total Protein: 7.4 g/dL (ref 6.5–8.1)

## 2024-01-10 LAB — CBC WITH DIFFERENTIAL (CANCER CENTER ONLY)
Abs Immature Granulocytes: 0.05 K/uL (ref 0.00–0.07)
Basophils Absolute: 0 K/uL (ref 0.0–0.1)
Basophils Relative: 0 %
Eosinophils Absolute: 0.1 K/uL (ref 0.0–0.5)
Eosinophils Relative: 1 %
HCT: 37.9 % (ref 36.0–46.0)
Hemoglobin: 12.4 g/dL (ref 12.0–15.0)
Immature Granulocytes: 1 %
Lymphocytes Relative: 14 %
Lymphs Abs: 1 K/uL (ref 0.7–4.0)
MCH: 27.1 pg (ref 26.0–34.0)
MCHC: 32.7 g/dL (ref 30.0–36.0)
MCV: 82.9 fL (ref 80.0–100.0)
Monocytes Absolute: 1 K/uL (ref 0.1–1.0)
Monocytes Relative: 14 %
Neutro Abs: 5.1 K/uL (ref 1.7–7.7)
Neutrophils Relative %: 70 %
Platelet Count: 269 K/uL (ref 150–400)
RBC: 4.57 MIL/uL (ref 3.87–5.11)
RDW: 13.9 % (ref 11.5–15.5)
WBC Count: 7.3 K/uL (ref 4.0–10.5)
nRBC: 0 % (ref 0.0–0.2)

## 2024-01-10 MED ORDER — SODIUM CHLORIDE 0.9 % IV SOLN
INTRAVENOUS | Status: DC
Start: 2024-01-10 — End: 2024-01-10

## 2024-01-10 MED ORDER — SODIUM CHLORIDE 0.9 % IV SOLN
350.0000 mg | Freq: Once | INTRAVENOUS | Status: AC
  Administered 2024-01-10: 350 mg via INTRAVENOUS
  Filled 2024-01-10: qty 7

## 2024-01-10 NOTE — Patient Instructions (Signed)
 [facility] - A DEPT OF [facility]  Discharge Instructions: Thank you for choosing [facility] to provide your oncology and hematology care.   If you have a lab appointment with the Cancer Center, please go directly to the Cancer Center and check in at the registration area.   Wear comfortable clothing and clothing appropriate for easy access to any Portacath or PICC line.   We strive to give you quality time with your provider. You may need to reschedule your appointment if you arrive late (15 or more minutes).  Arriving late affects you and other patients whose appointments are after yours.  Also, if you miss three or more appointments without notifying the office, you may be dismissed from the clinic at the provider's discretion.      For prescription refill requests, have your pharmacy contact our office and allow 72 hours for refills to be completed.    Today you received the following chemotherapy and/or immunotherapy agents cemiplimab       To help prevent nausea and vomiting after your treatment, we encourage you to take your nausea medication as directed.  BELOW ARE SYMPTOMS THAT SHOULD BE REPORTED IMMEDIATELY: *FEVER GREATER THAN 100.4 F (38 C) OR HIGHER *CHILLS OR SWEATING *NAUSEA AND VOMITING THAT IS NOT CONTROLLED WITH YOUR NAUSEA MEDICATION *UNUSUAL SHORTNESS OF BREATH *UNUSUAL BRUISING OR BLEEDING *URINARY PROBLEMS (pain or burning when urinating, or frequent urination) *BOWEL PROBLEMS (unusual diarrhea, constipation, pain near the anus) TENDERNESS IN MOUTH AND THROAT WITH OR WITHOUT PRESENCE OF ULCERS (sore throat, sores in mouth, or a toothache) UNUSUAL RASH, SWELLING OR PAIN  UNUSUAL VAGINAL DISCHARGE OR ITCHING   Items with * indicate a potential emergency and should be followed up as soon as possible or go to the Emergency Department if any problems should occur.  Please show the CHEMOTHERAPY ALERT CARD or IMMUNOTHERAPY  ALERT CARD at check-in to the Emergency Department and triage nurse.  Should you have questions after your visit or need to cancel or reschedule your appointment, please contact [facility] - A DEPT OF [facility]  Dept: [phone]  and follow the prompts.  Office hours are 8:00 a.m. to 4:30 p.m. Monday - Friday. Please note that voicemails left after 4:00 p.m. may not be returned until the following business day.  We are closed weekends and major holidays. You have access to a nurse at all times for urgent questions. Please call the main number to the clinic Dept: [phone] and follow the prompts.   For any non-urgent questions, you may also contact your provider using MyChart. We now offer e-Visits for anyone 71 and older to request care online for non-urgent symptoms. For details visit [email or web address].   Also download the MyChart app! Go to the app store, search "MyChart", open the app, select [facility], and log in with your MyChart username and password.

## 2024-01-10 NOTE — Progress Notes (Signed)
 [facility] Cancer Follow up:    Marcia Lenis, MD [address] [place] [address]   DIAGNOSIS:  Cancer Staging  No matching staging information was found for the patient.    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, estrogen receptor negative (HCC)   07/31/2023 Cancer Staging   Staging form: Breast, AJCC 8th Edition - Clinical stage from 07/31/2023: Stage Unknown (cTX, cN1, cM0, ER-, PR-, HER2-) - Signed by Loretha Ash, MD on 07/31/2023 Stage prefix: Initial diagnosis Laterality: Right Staged by: Pathologist and managing physician Stage used in treatment planning: Yes National guidelines used in treatment planning: Yes Type of national guideline used in treatment planning: NCCN   08/12/2023 Genetic Testing   Single, 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  Report date is 08/12/2023.   The CancerNext-Expanded gene panel offered by [facility] and includes sequencing, rearrangement, and RNA analysis for the following 76 genes: AIP, ALK, APC, ATM, AXIN2, BAP1, BARD1, BMPR1A, BRCA1, BRCA2, BRIP1, CDC73, CDH1, CDK4, CDKN1B, CDKN2A, CEBPA, CHEK2, CTNNA1, DDX41, DICER1, ETV6, FH, FLCN, GATA2, LZTR1, MAX, MBD4, MEN1, MET, MLH1, MSH2, MSH3, MSH6, MUTYH, NF1, NF2, NTHL1, PALB2, PHOX2B, PMS2, POT1, PRKAR1A, PTCH1, PTEN, RAD51C, RAD51D, RB1,  RET, RUNX1, SDHA, SDHAF2, SDHB, SDHC, SDHD, SMAD4, SMARCA4, SMARCB1, SMARCE1, STK11, SUFU, TMEM127, TP53, TSC1, TSC2, VHL, and WT1 (sequencing and deletion/duplication); EGFR, HOXB13, KIT, MITF, PDGFRA, POLD1, and POLE (sequencing only); EPCAM and GREM1 (deletion/duplication only).    Squamous cell carcinoma of skin  10/28/2023 - 12/05/2023 Radiation Therapy   First Treatment Date: 2023-10-28 Last Treatment Date: 2023-12-05   Plan Name: Axilla_R Site: Axilla, Right Technique: 3D Mode: Photon Dose Per Fraction: 1.8 Gy Prescribed Dose (Delivered / Prescribed): 50.4 Gy / 50.4 Gy Prescribed Fxs (Delivered / Prescribed): 28 / 28   11/11/2023 Initial Diagnosis   Squamous cell carcinoma of skin   12/20/2023 -  Chemotherapy   Patient is on Treatment Plan : ADVANCED CUTANEOUS SQUAMOUS CELL CARCINOMA Cemiplimab  q21d       CURRENT THERAPY: Cemiplimab   INTERVAL HISTORY: History of Present Illness Marcia Harvey is a 78 year old female with squamous cell carcinoma undergoing immunotherapy who presents with joint and muscle pain.  She began experiencing significant joint and muscle pain approximately two and a half weeks ago, starting four to five days after her first immunotherapy infusion. The pain is described as feeling like being 'hit by a truck' or 'beaten with a baseball bat all over,' affecting all muscles and joints. She rates the pain as an eight on a scale of ten during its peak, which lasts for about two days. She manages the pain with three Advil, sometimes twice a day, but tries to avoid daily use due to concerns about medication dependency.  She reports ongoing fatigue since starting radiation therapy, which she describes as manageable. She has been using home physical therapy exercises and self-massage to alleviate pain under her arm related to  a surgical scar, which has improved her symptoms significantly.  She has a history of dermatological issues, including precancerous lesions  treated with cryotherapy. She wants to have skin tags and moles removed due to irritation from clothing and accessories.  She is currently on a regimen on adj cemiplimab . She is concerned about the potential increase in  joint pain with the dose escalation.  She mentions previous pain under her arm related to a surgical scar.   Patient Active Problem List   Diagnosis Date Noted   Squamous cell carcinoma of skin 11/11/2023   Recurrent skin cancer 10/02/2023   Genetic testing 08/21/2023   Spondylosis without myelopathy or radiculopathy, cervical region 07/27/2019   Other intervertebral disc degeneration, lumbar region 07/27/2019   Fall from horse 01/27/2012    is allergic to codeine, iodinated contrast media, iohexol, shellfish-derived products, bee pollen, iodine, wound dressing adhesive, and tape.  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. Findings: Nondisplaced fx of posterior right 11th rib   GERD (gastroesophageal reflux disease)    History of radiation therapy    10/28/23-12/05/23- Dr. Lynwood Nasuti (Right Axilla)   Hypertension    Seizures (HCC)    been through testing, no issues with seizures or epilepsy   Syncopal episodes    seizure with episode   White matter disease     SURGICAL HISTORY: Past Surgical History:  Procedure Laterality Date   AXILLARY LYMPH NODE DISSECTION Right 09/04/2023   Procedure: REDGIE HARD;  Surgeon: Vanderbilt Ned, MD;  Location: [facility];  Service: General;  Laterality: Right;  GEN w/PEC BLOCK   BREAST LUMPECTOMY WITH RADIOACTIVE SEED LOCALIZATION Left 06/21/2020   Procedure: LEFT BREAST LUMPECTOMY WITH RADIOACTIVE SEED LOCALIZATION;  Surgeon: Belinda Cough, MD;  Location: [facility];  Service: General;  Laterality: Left;    BUNIONECTOMY Bilateral 1978   COLONOSCOPY     EYE SURGERY     MELANOMA EXCISION Right 08/02/2020   Procedure: 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;  Surgeon: Belinda Cough, MD;  Location: [facility];  Service: General;  Laterality: Right;  ROOM 8 STARTING AT 04:15PM FOR 45 MIN   SENTINEL NODE BIOPSY Right 09/04/2023   Procedure: BIOPSY, LYMPH NODE, SENTINEL;  Surgeon: Vanderbilt Ned, MD;  Location: [facility];  Service: General;  Laterality: Right;  SEED LOCALIZED RIGHT AXILLARY LYMPHNODE EXCIONSAL BIOPSY    SOCIAL HISTORY: Social History   Socioeconomic History   Marital status: Single    Spouse name: Not on file   Number of children: Not on file   Years of education: Not on file   Highest education level: Not on file  Occupational History   Not on file  Tobacco Use   Smoking status: Former    Current packs/day: 0.00    Types: Cigarettes    Quit date: 05/21/2003    Years since quitting: 20.6   Smokeless tobacco: Never  Vaping Use   Vaping status: Never Used  Substance and Sexual Activity   Alcohol use: Not Currently    Comment: 3-4x per year   Drug use: No   Sexual activity: Not Currently    Birth control/protection: Post-menopausal  Other Topics Concern   Not on file  Social History Narrative  Are you right handed or left handed? right   Are you currently employed ? no   What is your current occupation? Retired    Do you live at home alone? Alone    Who lives with you? NA    What type of home do you live in: 1 story or 2 story?  1 story        Social Drivers of Corporate investment banker Strain: Low Risk  (07/22/2023)   Received from [facility]   Overall Financial Resource Strain (CARDIA)    Difficulty of Paying Living Expenses: Not very hard  Food Insecurity: No Food Insecurity (10/02/2023)   Hunger Vital Sign    Worried About Running Out of Food in the Last Year: Never true    Ran Out of Food in the Last Year:  Never true  Transportation Needs: No Transportation Needs (10/02/2023)   PRAPARE - Administrator, Civil Service (Medical): No    Lack of Transportation (Non-Medical): No  Physical Activity: Sufficiently Active (07/22/2023)   Received from [facility]   Exercise Vital Sign    On average, how many days per week do you engage in moderate to strenuous exercise (like a brisk walk)?: 5 days    On average, how many minutes do you engage in exercise at this level?: 60 min  Stress: Stress Concern Present (07/22/2023)   Received from [facility] - Occupational Stress Questionnaire    Feeling of Stress : To some extent  Social Connections: Socially Integrated (07/22/2023)   Received from [facility]   Social Network    How would you rate your social network (family, work, friends)?: Good participation with social networks  Intimate Partner Violence: Not At Risk (10/02/2023)   Humiliation, Afraid, Rape, and Kick questionnaire    Fear of Current or Ex-Partner: No    Emotionally Abused: No    Physically Abused: No    Sexually Abused: No    FAMILY HISTORY: Family History  Problem Relation Age of Onset   Colon cancer Mother 43       d. 85   Prostate cancer Father 32   Lung cancer Maternal Uncle        dx >50   Cancer Paternal Aunt        unknown type; mets; d. >50   Diabetes Maternal Grandfather    Diabetes Paternal Grandmother     Review of Systems  Constitutional:  Negative for appetite change, chills, fatigue, fever and unexpected weight change.  HENT:   Negative for hearing loss, lump/mass and trouble swallowing.   Eyes:  Negative for eye problems and icterus.  Respiratory:  Negative for chest tightness, cough and shortness of breath.   Cardiovascular:  Negative for chest pain, leg swelling and palpitations.  Gastrointestinal:  Negative for abdominal distention, abdominal pain, constipation, diarrhea, nausea and vomiting.   Endocrine: Negative for hot flashes.  Genitourinary:  Negative for difficulty urinating.   Musculoskeletal:  Negative for arthralgias.  Skin:  Negative for itching and rash.  Neurological:  Negative for dizziness, extremity weakness, headaches and numbness.  Hematological:  Negative for adenopathy. Does not bruise/bleed easily.  Psychiatric/Behavioral:  Negative for depression. The patient is not nervous/anxious.       PHYSICAL EXAMINATION     Vitals:   01/10/24 1129  BP: 134/81  Pulse: 74  Resp: 17  Temp: 98.7 F (37.1 C)  SpO2: 97%    Physical  Exam Constitutional:      General: She is not in acute distress.    Appearance: Normal appearance. She is not toxic-appearing.  HENT:     Head: Normocephalic and atraumatic.     Mouth/Throat:     Mouth: Mucous membranes are moist.     Pharynx: Oropharynx is clear. No oropharyngeal exudate or posterior oropharyngeal erythema.  Eyes:     General: No scleral icterus. Cardiovascular:     Rate and Rhythm: Normal rate and regular rhythm.     Pulses: Normal pulses.     Heart sounds: Normal heart sounds.  Pulmonary:     Effort: Pulmonary effort is normal.     Breath sounds: Normal breath sounds.  Abdominal:     General: Abdomen is flat. Bowel sounds are normal. There is no distension.     Palpations: Abdomen is soft.     Tenderness: There is no abdominal tenderness.  Musculoskeletal:        General: No swelling.     Cervical back: Neck supple.  Lymphadenopathy:     Cervical: No cervical adenopathy.  Skin:    General: Skin is warm and dry.     Findings: No rash.  Neurological:     General: No focal deficit present.     Mental Status: She is alert.  Psychiatric:        Mood and Affect: Mood normal.        Behavior: Behavior normal.     LABORATORY DATA:  CBC    Component Value Date/Time   WBC 7.3 01/10/2024 1109   WBC 8.6 06/07/2014 2230   RBC 4.57 01/10/2024 1109   HGB 12.4 01/10/2024 1109   HCT 37.9 01/10/2024  1109   PLT 269 01/10/2024 1109   MCV 82.9 01/10/2024 1109   MCH 27.1 01/10/2024 1109   MCHC 32.7 01/10/2024 1109   RDW 13.9 01/10/2024 1109   LYMPHSABS 1.0 01/10/2024 1109   MONOABS 1.0 01/10/2024 1109   EOSABS 0.1 01/10/2024 1109   BASOSABS 0.0 01/10/2024 1109    CMP     Component Value Date/Time   NA 141 12/20/2023 0856   K 4.0 12/20/2023 0856   CL 108 12/20/2023 0856   CO2 26 12/20/2023 0856   GLUCOSE 84 12/20/2023 0856   BUN 18 12/20/2023 0856   CREATININE 0.74 12/20/2023 0856   CALCIUM 9.3 12/20/2023 0856   PROT 7.3 12/20/2023 0856   ALBUMIN 4.2 12/20/2023 0856   AST 25 12/20/2023 0856   ALT 10 12/20/2023 0856   ALKPHOS 126 12/20/2023 0856   BILITOT 0.4 12/20/2023 0856   GFRNONAA >60 12/20/2023 0856   GFRAA 43 (L) 06/07/2014 2230     ASSESSMENT and THERAPY PLAN:  Assessment and Plan Assessment & Plan  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 Experiencing joint and muscle pain as a side effect, manageable with Advil. - Continue current immunotherapy regimen. - Encouraged her to discuss with dermatologist about skin concerns and potential removal of non-cancerous lesions.  Immunotherapy-related myalgia and arthralgia Significant joint and muscle pain starting four to five days post-infusion, affecting all muscles.  Manages with Advil, but hesitant to take medication unless necessary. Tramadol  offered as an alternative. - Prescribe tramadol  to manage pain. - Prescription sent.  Dehydration Lab results indicate mild dehydration, evidenced by an elevated BUN level. - Advise to increase water intake.  All questions were answered. The patient knows to call the clinic with any problems, questions or  concerns. We can certainly see the patient much sooner if necessary.  Total encounter time:30 minutes*in face-to-face visit time, chart review, lab review, care coordination, order  entry, and documentation of the encounter time.  *Total Encounter Time as defined by the Centers for Medicare and Medicaid Services includes, in addition to the face-to-face time of a patient visit (documented in the note above) non-face-to-face time: obtaining and reviewing outside history, ordering and reviewing medications, tests or procedures, care coordination (communications with other health care professionals or caregivers) and documentation in the medical record.

## 2024-01-14 ENCOUNTER — Other Ambulatory Visit: Payer: Self-pay | Admitting: Hematology and Oncology

## 2024-01-14 ENCOUNTER — Telehealth: Payer: Self-pay

## 2024-01-14 ENCOUNTER — Encounter: Payer: Self-pay | Admitting: Hematology and Oncology

## 2024-01-14 DIAGNOSIS — C4492 Squamous cell carcinoma of skin, unspecified: Secondary | ICD-10-CM

## 2024-01-14 MED ORDER — TRAMADOL HCL 50 MG PO TABS
50.0000 mg | ORAL_TABLET | Freq: Two times a day (BID) | ORAL | 0 refills | Status: AC | PRN
Start: 2024-01-14 — End: ?

## 2024-01-14 NOTE — Telephone Encounter (Signed)
 Pt called to ask about Tramadol  Rx and if it has been sent in. Dr Loretha sent this in today. She is also asking about next 4 appts needing to be scheduled. Message sent to schedulers to contact pt.

## 2024-01-14 NOTE — Progress Notes (Incomplete)
 Squamous cell carcinoma of skin  - Primary C44.92   Marcia Harvey is here today for follow up post radiation to the  right axilla.    Malignant neoplasm of axillary tail of right breast in female    They completed their radiation on: 2023-12-05   Does the patient complain of any of the following: Post radiation skin issues: *** Breast Tenderness: *** Breast Swelling: *** Lymphadema: *** Range of Motion limitations: *** Fatigue post radiation: *** Appetite good/fair/poor: ***  Additional comments if applicable:

## 2024-01-15 ENCOUNTER — Other Ambulatory Visit: Payer: Self-pay

## 2024-01-16 ENCOUNTER — Ambulatory Visit
Admission: RE | Admit: 2024-01-16 | Discharge: 2024-01-16 | Disposition: A | Discharge status: Home / Self Care | Source: Ambulatory Visit | Attending: Radiology | Admitting: Radiology

## 2024-01-16 DIAGNOSIS — C4492 Squamous cell carcinoma of skin, unspecified: Secondary | ICD-10-CM

## 2024-01-16 DIAGNOSIS — C4499 Other specified malignant neoplasm of skin, unspecified: Secondary | ICD-10-CM

## 2024-01-16 NOTE — Progress Notes (Signed)
 Radiation Oncology         [phone]) [phone] ________________________________  Name: Marcia Harvey MRN: [id number]  Date: 01/16/2024  DOB: 26-Sep-1945  Follow-Up Visit Note - Conducted via telephone at patient request.   I spoke with the patient to conduct this consult visit via telephone. The patient was notified in advance and was offered an in person or telemedicine meeting to allow for face to face communication but instead preferred to proceed with a telephone consult.   CC: [name], MD  Loretha Ash, MD    ICD-10-CM   1. Recurrent skin cancer  C44.99     2. 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 She was last seen in our clinic on 01/03/2024.   At that time, she had healed well from the effects of her radiation treatment overall. She noted a grinding sensation with right shoulder ROM. Patient had not been completing her PT exercises, so it was recommended to continue these, as prescribed.  In the interim, she met with her oncologist, Dr. Loretha on 01/14/2024. At that time she recommended continuing with her current immunotherapy regimen.   Today patient states she has been doing her recommended PT exercises and massaging the area of scar tissue. She states that she no longer has the grinding sensation and is very pleased with how she has healed.   Allergies:  is allergic to codeine, iodinated contrast media, iohexol, shellfish-derived products, bee  pollen, iodine, wound dressing adhesive, and tape.  Meds: Current Outpatient Medications  Medication Sig Dispense Refill   ascorbic acid (VITAMIN C) 500 MG tablet Take by mouth.     atorvastatin (LIPITOR) 40 MG tablet Take 40 mg by mouth daily.     augmented betamethasone dipropionate (DIPROLENE-AF) 0.05 % cream Apply topically 2 (two) times daily.     b complex vitamins tablet Take 1 tablet by mouth daily.     carboxymethylcellul-glycerin (OPTIVE) 0.5-0.9 % ophthalmic solution Apply to eye.     Cholecalciferol (VITAMIN D3) 2000 UNITS TABS Take 2,000 Units by mouth daily.     gabapentin  (NEURONTIN ) 300 MG capsule Take 300 mg by mouth 3 (three) times daily. Ordered by Dr. Debby Shipper for nerve pain post op. She said he told her to take 2 at a time. (Patient not taking: Reported on 01/16/2024)     lisinopril -hydrochlorothiazide  (PRINZIDE ,ZESTORETIC ) 10-12.5 MG per tablet Take 1 tablet by mouth daily. NEED VISIT, LABS!!  2nd NOTICE! 30 tablet 0   omeprazole (PRILOSEC) 40 MG capsule TAKE 1 CAPSULE BY MOUTH EVERY DAY     ondansetron  (ZOFRAN ) 8 MG tablet Take 1 tablet (8 mg total) by mouth every 8 (eight) hours as needed for nausea or vomiting. 30 tablet 1   sertraline (ZOLOFT) 25 MG tablet TAKE 1 TABLET(25 MG) BY MOUTH DAILY     traMADol  (ULTRAM ) 50 MG tablet Take 1 tablet (50 mg total) by mouth every 12 (twelve) hours as needed. 30 tablet 0   traZODone (DESYREL) 50  MG tablet 25 to 50 mg at bedtime to help with sleep (Patient not taking: Reported on 01/16/2024)     No current facility-administered medications for this encounter.    Physical Findings: The patient is in no acute distress. Patient is alert and oriented.  vitals were not taken for this visit. SABRA Deferred, due to nature of the telephone visit.    Lab Findings: Lab Results  Component Value Date   WBC 7.3 01/10/2024   HGB 12.4 01/10/2024   HCT 37.9 01/10/2024   MCV 82.9 01/10/2024   PLT 269 01/10/2024    Radiographic  Findings: No results found.  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   Patient will continue on cemiplimab  under the care of Dr. Loretha. She is scheduled for her next infusion and follow-up on 01/31/2024. Radiation follow-up PRN. We appreciate the opportunity to take part in this patient's care.    20 minutes of total time was spent for this patient encounter, including preparation, face-to-face counseling with the patient and coordination of care, physical exam, and documentation of the encounter. ____________________________________   Leeroy Due, PA-C

## 2024-01-23 ENCOUNTER — Telehealth: Payer: Self-pay | Admitting: *Deleted

## 2024-01-23 NOTE — Telephone Encounter (Signed)
 Patient called stating that she has new shortness of breath over the last 4 days.  She states that she feels like she is unable to take a deep breath in and when she tries she has an non productive cough.  Just feels like something is wrong.  Denies fever, snuffy nose, congestion, productive cough.  Reports self tested covid Negative.    Reports body aches/joint pain but that was to be expected from treatment.  Scheduled follow up with Dr Loretha tomorrow to evaluate.

## 2024-01-24 ENCOUNTER — Ambulatory Visit (HOSPITAL_COMMUNITY)
Admission: RE | Admit: 2024-01-24 | Discharge: 2024-01-24 | Disposition: A | Discharge status: Home / Self Care | Source: Ambulatory Visit | Attending: Hematology and Oncology | Admitting: Hematology and Oncology

## 2024-01-24 ENCOUNTER — Inpatient Hospital Stay: Attending: Hematology and Oncology | Admitting: Hematology and Oncology

## 2024-01-24 VITALS — BP 130/84 | HR 85 | Resp 18 | Wt 170.8 lb

## 2024-01-24 DIAGNOSIS — Z5111 Encounter for antineoplastic chemotherapy: Secondary | ICD-10-CM | POA: Insufficient documentation

## 2024-01-24 DIAGNOSIS — E041 Nontoxic single thyroid nodule: Secondary | ICD-10-CM | POA: Insufficient documentation

## 2024-01-24 DIAGNOSIS — C4492 Squamous cell carcinoma of skin, unspecified: Secondary | ICD-10-CM | POA: Diagnosis present

## 2024-01-24 DIAGNOSIS — H547 Unspecified visual loss: Secondary | ICD-10-CM | POA: Diagnosis not present

## 2024-01-24 DIAGNOSIS — Z87891 Personal history of nicotine dependence: Secondary | ICD-10-CM | POA: Insufficient documentation

## 2024-01-24 DIAGNOSIS — Z923 Personal history of irradiation: Secondary | ICD-10-CM | POA: Diagnosis not present

## 2024-01-24 DIAGNOSIS — Z79899 Other long term (current) drug therapy: Secondary | ICD-10-CM | POA: Insufficient documentation

## 2024-01-24 DIAGNOSIS — R0602 Shortness of breath: Secondary | ICD-10-CM | POA: Insufficient documentation

## 2024-01-24 MED ORDER — METHYLPREDNISOLONE 4 MG PO TBPK: ORAL_TABLET | ORAL | 0 refills | Status: DC

## 2024-01-24 NOTE — Progress Notes (Signed)
 [facility] Cancer Follow up:    Marcia Lenis, MD [address] [place] [address]   DIAGNOSIS:  Cancer Staging  No matching staging information was found for the patient.    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, estrogen receptor negative (HCC)   07/31/2023 Cancer Staging   Staging form: Breast, AJCC 8th Edition - Clinical stage from 07/31/2023: Stage Unknown (cTX, cN1, cM0, ER-, PR-, HER2-) - Signed by Marcia Ash, MD on 07/31/2023 Stage prefix: Initial diagnosis Laterality: Right Staged by: Pathologist and managing physician Stage used in treatment planning: Yes National guidelines used in treatment planning: Yes Type of national guideline used in treatment planning: NCCN   08/12/2023 Genetic Testing   Single, 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  Report date is 08/12/2023.   The CancerNext-Expanded gene panel offered by [facility] and includes sequencing, rearrangement, and RNA analysis for the following 76 genes: AIP, ALK, APC, ATM, AXIN2, BAP1, BARD1, BMPR1A, BRCA1, BRCA2, BRIP1, CDC73, CDH1, CDK4, CDKN1B, CDKN2A, CEBPA, CHEK2, CTNNA1, DDX41, DICER1, ETV6, FH, FLCN, GATA2, LZTR1, MAX, MBD4, MEN1, MET, MLH1, MSH2, MSH3, MSH6, MUTYH, NF1, NF2, NTHL1, PALB2, PHOX2B, PMS2, POT1, PRKAR1A, PTCH1, PTEN, RAD51C, RAD51D, RB1,  RET, RUNX1, SDHA, SDHAF2, SDHB, SDHC, SDHD, SMAD4, SMARCA4, SMARCB1, SMARCE1, STK11, SUFU, TMEM127, TP53, TSC1, TSC2, VHL, and WT1 (sequencing and deletion/duplication); EGFR, HOXB13, KIT, MITF, PDGFRA, POLD1, and POLE (sequencing only); EPCAM and GREM1 (deletion/duplication only).    Squamous cell carcinoma of skin  10/28/2023 - 12/05/2023 Radiation Therapy   First Treatment Date: 2023-10-28 Last Treatment Date: 2023-12-05   Plan Name: Axilla_R Site: Axilla, Right Technique: 3D Mode: Photon Dose Per Fraction: 1.8 Gy Prescribed Dose (Delivered / Prescribed): 50.4 Gy / 50.4 Gy Prescribed Fxs (Delivered / Prescribed): 28 / 28   11/11/2023 Initial Diagnosis   Squamous cell carcinoma of skin   12/20/2023 -  Chemotherapy   Patient is on Treatment Plan : ADVANCED CUTANEOUS SQUAMOUS CELL CARCINOMA Cemiplimab  q21d       CURRENT THERAPY: Cemiplimab   INTERVAL HISTORY: History of Present Illness  Marcia Harvey is a 78 year old female who presents with shortness of breath and Harvey.  She experiences shortness of breath and a sensation of not getting enough air, particularly during hot and humid weather. These symptoms have persisted even after the weather cooled down, which is unusual for her. She describes a dry Harvey that occurs when attempting to take a deep breath, sometimes producing sputum. These symptoms have been present since the summer.  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 She has not experienced lung issues since obtaining a classroom with windows for fresh air. She tested negative for COVID-19 recently.  She experiences severe pain that is not relieved by tramadol , requiring her to take four Advil (200-225 mg each) for relief. This pain occurs about  once a week and is described as feeling like being 'beat with a baseball bat'. It affects her ability to sleep comfortably and causes her to  wake up in pain.  Oxygen saturation is 95%. No history of asthma or childhood lung issues and was not a premature baby.  She is currently on immunotherapy and is concerned about potential side effects impacting her treatment.   Patient Active Problem List   Diagnosis Date Noted   Squamous cell carcinoma of skin 11/11/2023   Recurrent skin cancer 10/02/2023   Genetic testing 08/21/2023   Spondylosis without myelopathy or radiculopathy, cervical region 07/27/2019   Other intervertebral disc degeneration, lumbar region 07/27/2019   Fall from horse 01/27/2012    is allergic to codeine, iodinated contrast media, iohexol, shellfish-derived products, bee pollen, iodine, wound dressing adhesive, and tape.  MEDICAL HISTORY: Past Medical History:  Diagnosis Date   Anxiety    Anxiety    Arthritis    Cancer (HCC) 07/2020   SCC chest   Depression    Fall from horse 01/27/2012   CT chest/Abd/Pelvis done at [facility]. Findings: Nondisplaced fx of posterior right 11th rib   GERD (gastroesophageal reflux disease)    History of radiation therapy    10/28/23-12/05/23- Dr. Lynwood Harvey (Right Axilla)   Hypertension    Seizures (HCC)    been through testing, no issues with seizures or epilepsy   Syncopal episodes    seizure with episode   White matter disease     SURGICAL HISTORY: Past Surgical History:  Procedure Laterality Date   AXILLARY LYMPH NODE DISSECTION Right 09/04/2023   Procedure: REDGIE HARD;  Surgeon: Marcia Ned, MD;  Location: [facility];  Service: General;  Laterality: Right;  GEN w/PEC BLOCK   BREAST LUMPECTOMY WITH RADIOACTIVE SEED LOCALIZATION Left 06/21/2020   Procedure: LEFT BREAST LUMPECTOMY WITH RADIOACTIVE SEED LOCALIZATION;  Surgeon: Marcia Cough, MD;  Location: [facility];  Service: General;  Laterality: Left;   BUNIONECTOMY Bilateral 1978   COLONOSCOPY     EYE SURGERY     MELANOMA EXCISION Right 08/02/2020    Procedure: 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;  Surgeon: Marcia Cough, MD;  Location: [facility];  Service: General;  Laterality: Right;  ROOM 8 STARTING AT 04:15PM FOR 45 MIN   SENTINEL NODE BIOPSY Right 09/04/2023   Procedure: BIOPSY, LYMPH NODE, SENTINEL;  Surgeon: Marcia Ned, MD;  Location: [facility];  Service: General;  Laterality: Right;  SEED LOCALIZED RIGHT AXILLARY LYMPHNODE EXCIONSAL BIOPSY    SOCIAL HISTORY: Social History   Socioeconomic History   Marital status: Single    Spouse name: Not on file   Number of children: Not on file   Years of education: Not on file   Highest education level: Not on file  Occupational History   Not on file  Tobacco Use   Smoking status: Former    Current packs/day: 0.00    Types: Cigarettes    Quit date: 05/21/2003    Years since quitting: 20.6   Smokeless tobacco: Never  Vaping Use   Vaping status: Never Used  Substance and Sexual Activity   Alcohol use: Not Currently    Comment: 3-4x per year   Drug use: No   Sexual activity: Not Currently    Birth control/protection: Post-menopausal  Other Topics Concern   Not on file  Social History Narrative   Are you right handed or left handed? right  Are you currently employed ? no   What is your current occupation? Retired    Do you live at home alone? Alone    Who lives with you? NA    What type of home do you live in: 1 story or 2 story?  1 story        Social Drivers of Corporate investment banker Strain: Low Risk  (07/22/2023)   Received from [facility]   Overall Financial Resource Strain (CARDIA)    Difficulty of Paying Living Expenses: Not very hard  Food Insecurity: No Food Insecurity (10/02/2023)   Hunger Vital Sign    Worried About Running Out of Food in the Last Year: Never true    Ran Out of Food in the Last Year: Never true  Transportation Needs: No Transportation Needs (10/02/2023)   PRAPARE - Therapist, art (Medical): No    Lack of Transportation (Non-Medical): No  Physical Activity: Sufficiently Active (07/22/2023)   Received from [facility]   Exercise Vital Sign    On average, how many days per week do you engage in moderate to strenuous exercise (like a brisk walk)?: 5 days    On average, how many minutes do you engage in exercise at this level?: 60 min  Stress: Stress Concern Present (07/22/2023)   Received from [facility] - Occupational Stress Questionnaire    Feeling of Stress : To some extent  Social Connections: Socially Integrated (07/22/2023)   Received from [facility]   Social Network    How would you rate your social network (family, work, friends)?: Good participation with social networks  Intimate Partner Violence: Not At Risk (10/02/2023)   Humiliation, Afraid, Rape, and Kick questionnaire    Fear of Current or Ex-Partner: No    Emotionally Abused: No    Physically Abused: No    Sexually Abused: No    FAMILY HISTORY: Family History  Problem Relation Age of Onset   Colon cancer Mother 29       d. 46   Prostate cancer Father 56   Lung cancer Maternal Uncle        dx >50   Cancer Paternal Aunt        unknown type; mets; d. >50   Diabetes Maternal Grandfather    Diabetes Paternal Grandmother     Review of Systems  Constitutional:  Negative for appetite change, chills, fatigue, fever and unexpected weight change.  HENT:   Negative for hearing loss, lump/mass and trouble swallowing.   Eyes:  Negative for eye problems and icterus.  Respiratory:  Negative for chest tightness, Harvey and shortness of breath.   Cardiovascular:  Negative for chest pain, leg swelling and palpitations.  Gastrointestinal:  Negative for abdominal distention, abdominal pain, constipation, diarrhea, nausea and vomiting.  Endocrine: Negative for hot flashes.  Genitourinary:  Negative for difficulty urinating.    Musculoskeletal:  Negative for arthralgias.  Skin:  Negative for itching and rash.  Neurological:  Negative for dizziness, extremity weakness, headaches and numbness.  Hematological:  Negative for adenopathy. Does not bruise/bleed easily.  Psychiatric/Behavioral:  Negative for depression. The patient is not nervous/anxious.       PHYSICAL EXAMINATION   Onc Performance Status - 01/24/24 1000       KPS SCALE   KPS % SCORE Able to carry on normal activity, minor s/s of disease  Vitals:   01/24/24 1013  BP: 130/84  Pulse: 85  Resp: 18  SpO2: 95%    Physical Exam Constitutional:      General: She is not in acute distress.    Appearance: Normal appearance. She is not toxic-appearing.  HENT:     Head: Normocephalic and atraumatic.     Mouth/Throat:     Mouth: Mucous membranes are moist.     Pharynx: Oropharynx is clear. No oropharyngeal exudate or posterior oropharyngeal erythema.  Eyes:     General: No scleral icterus. Cardiovascular:     Rate and Rhythm: Normal rate and regular rhythm.     Pulses: Normal pulses.     Heart sounds: Normal heart sounds.  Pulmonary:     Effort: Pulmonary effort is normal.     Breath sounds: Normal breath sounds.  Chest:     Comments: Harvey on deep inspiration. NO crackles noted. Abdominal:     General: Abdomen is flat. Bowel sounds are normal. There is no distension.     Palpations: Abdomen is soft.     Tenderness: There is no abdominal tenderness.  Musculoskeletal:        General: No swelling.     Cervical back: Neck supple.  Lymphadenopathy:     Cervical: No cervical adenopathy.  Skin:    General: Skin is warm and dry.     Findings: No rash.  Neurological:     General: No focal deficit present.     Mental Status: She is alert.  Psychiatric:        Mood and Affect: Mood normal.        Behavior: Behavior normal.     LABORATORY DATA:  CBC    Component Value Date/Time   WBC 7.3 01/10/2024 1109   WBC 8.6  06/07/2014 2230   RBC 4.57 01/10/2024 1109   HGB 12.4 01/10/2024 1109   HCT 37.9 01/10/2024 1109   PLT 269 01/10/2024 1109   MCV 82.9 01/10/2024 1109   MCH 27.1 01/10/2024 1109   MCHC 32.7 01/10/2024 1109   RDW 13.9 01/10/2024 1109   LYMPHSABS 1.0 01/10/2024 1109   MONOABS 1.0 01/10/2024 1109   EOSABS 0.1 01/10/2024 1109   BASOSABS 0.0 01/10/2024 1109    CMP     Component Value Date/Time   NA 140 01/10/2024 1109   K 4.3 01/10/2024 1109   CL 106 01/10/2024 1109   CO2 26 01/10/2024 1109   GLUCOSE 89 01/10/2024 1109   BUN 30 (H) 01/10/2024 1109   CREATININE 0.84 01/10/2024 1109   CALCIUM 9.3 01/10/2024 1109   PROT 7.4 01/10/2024 1109   ALBUMIN 4.0 01/10/2024 1109   AST 29 01/10/2024 1109   ALT 10 01/10/2024 1109   ALKPHOS 130 (H) 01/10/2024 1109   BILITOT 0.4 01/10/2024 1109   GFRNONAA >60 01/10/2024 1109   GFRAA 43 (L) 06/07/2014 2230     ASSESSMENT and THERAPY PLAN:  Assessment and Plan Assessment & Plan  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  Shortness of breath and Harvey under evaluation for immunotherapy-related pneumonitis Shortness of breath and dry Harvey since summer. Oxygen saturation 95%. Differential includes immunotherapy-related pneumonitis, though not all typical signs are present. Pneumonitis not strongly suspected, but early identification of drug side effects is crucial. - Order CT chest with high resolution to evaluate for pneumonitis before the next treatment. - Prescribe Medrol  Dosepak (methylprednisolone ) to treat potential inflammation empirically.  Arthralgia likely related to immunotherapy  Severe pain unresponsive to tramadol , occurring approximately once a week. Arthralgia attributed to immunotherapy, manageable if occurring only once a week. - Continue current pain management regimen with tramadol  and Advil as needed, but avoid daily use of high-dose  Advil.  All questions were answered. The patient knows to call the clinic with any problems, questions or concerns. We can certainly see the patient much sooner if necessary.  Total encounter time:30 minutes*in face-to-face visit time, chart review, lab review, care coordination, order entry, and documentation of the encounter time.  *Total Encounter Time as defined by the Centers for Medicare and Medicaid Services includes, in addition to the face-to-face time of a patient visit (documented in the note above) non-face-to-face time: obtaining and reviewing outside history, ordering and reviewing medications, tests or procedures, care coordination (communications with other health care professionals or caregivers) and documentation in the medical record.

## 2024-01-27 ENCOUNTER — Ambulatory Visit: Payer: Self-pay | Admitting: Hematology and Oncology

## 2024-01-27 NOTE — Telephone Encounter (Signed)
Per Dr.Iruku, called pt with message below. Pt verbalized understanding

## 2024-01-27 NOTE — Telephone Encounter (Signed)
-----   Message from [place] Iruku sent at 01/27/2024  3:07 PM EDT ----- No pneumonitis. Please let the pt know. ----- Message ----- From: Interface, Rad Results In Sent: 01/24/2024   7:11 PM EDT To: Amber Stalls, MD

## 2024-01-28 ENCOUNTER — Other Ambulatory Visit: Payer: Self-pay

## 2024-01-31 ENCOUNTER — Inpatient Hospital Stay

## 2024-01-31 ENCOUNTER — Inpatient Hospital Stay: Admitting: Hematology and Oncology

## 2024-01-31 ENCOUNTER — Other Ambulatory Visit: Payer: Self-pay

## 2024-01-31 VITALS — BP 141/75 | HR 94 | Temp 98.3°F | Resp 20 | Wt 170.5 lb

## 2024-01-31 DIAGNOSIS — Z5111 Encounter for antineoplastic chemotherapy: Secondary | ICD-10-CM | POA: Diagnosis not present

## 2024-01-31 DIAGNOSIS — E041 Nontoxic single thyroid nodule: Secondary | ICD-10-CM

## 2024-01-31 DIAGNOSIS — C4492 Squamous cell carcinoma of skin, unspecified: Secondary | ICD-10-CM

## 2024-01-31 LAB — CBC WITH DIFFERENTIAL (CANCER CENTER ONLY)
Abs Immature Granulocytes: 0.06 K/uL (ref 0.00–0.07)
Basophils Absolute: 0 K/uL (ref 0.0–0.1)
Basophils Relative: 0 %
Eosinophils Absolute: 0.1 K/uL (ref 0.0–0.5)
Eosinophils Relative: 1 %
HCT: 39.9 % (ref 36.0–46.0)
Hemoglobin: 13.6 g/dL (ref 12.0–15.0)
Immature Granulocytes: 1 %
Lymphocytes Relative: 15 %
Lymphs Abs: 1.3 K/uL (ref 0.7–4.0)
MCH: 27.5 pg (ref 26.0–34.0)
MCHC: 34.1 g/dL (ref 30.0–36.0)
MCV: 80.6 fL (ref 80.0–100.0)
Monocytes Absolute: 1.1 K/uL — ABNORMAL HIGH (ref 0.1–1.0)
Monocytes Relative: 13 %
Neutro Abs: 6.2 K/uL (ref 1.7–7.7)
Neutrophils Relative %: 70 %
Platelet Count: 294 K/uL (ref 150–400)
RBC: 4.95 MIL/uL (ref 3.87–5.11)
RDW: 13.4 % (ref 11.5–15.5)
WBC Count: 8.7 K/uL (ref 4.0–10.5)
nRBC: 0 % (ref 0.0–0.2)

## 2024-01-31 LAB — CMP (CANCER CENTER ONLY)
ALT: 8 U/L (ref 0–44)
AST: 21 U/L (ref 15–41)
Albumin: 4.1 g/dL (ref 3.5–5.0)
Alkaline Phosphatase: 125 U/L (ref 38–126)
Anion gap: 10 (ref 5–15)
BUN: 31 mg/dL — ABNORMAL HIGH (ref 8–23)
CO2: 22 mmol/L (ref 22–32)
Calcium: 10 mg/dL (ref 8.9–10.3)
Chloride: 107 mmol/L (ref 98–111)
Creatinine: 1.06 mg/dL — ABNORMAL HIGH (ref 0.44–1.00)
GFR, Estimated: 53 mL/min — ABNORMAL LOW (ref >60)
Glucose, Bld: 99 mg/dL (ref 70–99)
Potassium: 3.9 mmol/L (ref 3.5–5.1)
Sodium: 139 mmol/L (ref 135–145)
Total Bilirubin: 0.3 mg/dL (ref 0.0–1.2)
Total Protein: 7.9 g/dL (ref 6.5–8.1)

## 2024-01-31 LAB — TSH: TSH: 1.59 u[IU]/mL (ref 0.350–4.500)

## 2024-01-31 MED ORDER — SODIUM CHLORIDE 0.9 % IV SOLN
350.0000 mg | Freq: Once | INTRAVENOUS | Status: AC
  Administered 2024-01-31: 350 mg via INTRAVENOUS
  Filled 2024-01-31: qty 7

## 2024-01-31 MED ORDER — SODIUM CHLORIDE 0.9 % IV SOLN: INTRAVENOUS | Status: DC

## 2024-01-31 NOTE — Progress Notes (Signed)
 [facility] Cancer Follow up:    Marcia Lenis, MD [address] [place] [address]   DIAGNOSIS:  Cancer Staging  No matching staging information was found for the patient.    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, estrogen receptor negative (HCC)   07/31/2023 Cancer Staging   Staging form: Breast, AJCC 8th Edition - Clinical stage from 07/31/2023: Stage Unknown (cTX, cN1, cM0, ER-, PR-, HER2-) - Signed by Loretha Ash, MD on 07/31/2023 Stage prefix: Initial diagnosis Laterality: Right Staged by: Pathologist and managing physician Stage used in treatment planning: Yes National guidelines used in treatment planning: Yes Type of national guideline used in treatment planning: NCCN   08/12/2023 Genetic Testing   Single, 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  Report date is 08/12/2023.   The CancerNext-Expanded gene panel offered by [facility] and includes sequencing, rearrangement, and RNA analysis for the following 76 genes: AIP, ALK, APC, ATM, AXIN2, BAP1, BARD1, BMPR1A, BRCA1, BRCA2, BRIP1, CDC73, CDH1, CDK4, CDKN1B, CDKN2A, CEBPA, CHEK2, CTNNA1, DDX41, DICER1, ETV6, FH, FLCN, GATA2, LZTR1, MAX, MBD4, MEN1, MET, MLH1, MSH2, MSH3, MSH6, MUTYH, NF1, NF2, NTHL1, PALB2, PHOX2B, PMS2, POT1, PRKAR1A, PTCH1, PTEN, RAD51C, RAD51D, RB1,  RET, RUNX1, SDHA, SDHAF2, SDHB, SDHC, SDHD, SMAD4, SMARCA4, SMARCB1, SMARCE1, STK11, SUFU, TMEM127, TP53, TSC1, TSC2, VHL, and WT1 (sequencing and deletion/duplication); EGFR, HOXB13, KIT, MITF, PDGFRA, POLD1, and POLE (sequencing only); EPCAM and GREM1 (deletion/duplication only).    Squamous cell carcinoma of skin  10/28/2023 - 12/05/2023 Radiation Therapy   First Treatment Date: 2023-10-28 Last Treatment Date: 2023-12-05   Plan Name: Axilla_R Site: Axilla, Right Technique: 3D Mode: Photon Dose Per Fraction: 1.8 Gy Prescribed Dose (Delivered / Prescribed): 50.4 Gy / 50.4 Gy Prescribed Fxs (Delivered / Prescribed): 28 / 28   11/11/2023 Initial Diagnosis   Squamous cell carcinoma of skin   12/20/2023 -  Chemotherapy   Patient is on Treatment Plan : ADVANCED CUTANEOUS SQUAMOUS CELL CARCINOMA Cemiplimab  q21d       CURRENT THERAPY: Cemiplimab   INTERVAL HISTORY: History of Present Illness   Marcia Harvey is a 78 year old female on adj cemiplimab  for SCC skin who presents with weakness in the voice and shortness of breath. She was referred by her primary care doctor for evaluation of calcifications noted on a CT scan.  She experiences shortness of breath, which has shown some improvement with prednisone , although it is not completely resolved. She recently completed a course of prednisone  and is currently on immunotherapy. Muscle and joint pain occur four to five days after an infusion and again about a week later. A severe episode of pain two weeks ago was not relieved by tramadol  but improved after taking four Advil tablets. No pain medication has been taken since then.  She has noticed a weakness in her voice, which her friends observed during social activities. She is unsure if it requires more effort to speak.  She reports vision changes and feels her prescription needs updating. Her ophthalmologist noted that both prednisone  and the immunotherapy drug could contribute to her vision  issues. Vision testing showed a change in her prescription, requiring more power for distance vision.  A CT scan led to a referral to a cardiologist due to calcifications noted in the report. She is awaiting an appointment with Dr. Odis Simone's office. Additionally, an ultrasound of her thyroid  has been recommended.  No fall allergies are reported.   Patient Active Problem List   Diagnosis Date Noted   Squamous cell carcinoma of skin 11/11/2023   Recurrent skin cancer 10/02/2023   Genetic testing 08/21/2023   Spondylosis without myelopathy or radiculopathy, cervical region 07/27/2019   Other intervertebral disc degeneration, lumbar region 07/27/2019   Fall from horse 01/27/2012    is allergic to codeine, iodinated contrast media, iohexol, shellfish-derived products, bee pollen, iodine, wound dressing adhesive, and tape.  MEDICAL HISTORY: Past Medical History:  Diagnosis Date   Anxiety    Anxiety    Arthritis    Cancer (HCC) 07/2020   SCC chest   Depression    Fall from horse 01/27/2012   CT chest/Abd/Pelvis done at [facility]. Findings: Nondisplaced fx of posterior right 11th rib   GERD (gastroesophageal reflux disease)    History of radiation therapy    10/28/23-12/05/23- Dr. Lynwood Nasuti (Right Axilla)   Hypertension    Seizures (HCC)    been through testing, no issues with seizures or epilepsy   Syncopal episodes    seizure with episode   White matter disease     SURGICAL HISTORY: Past Surgical History:  Procedure Laterality Date   AXILLARY LYMPH NODE DISSECTION Right 09/04/2023   Procedure: REDGIE HARD;  Surgeon: Vanderbilt Ned, MD;  Location: [facility];  Service: General;  Laterality: Right;  GEN w/PEC BLOCK   BREAST LUMPECTOMY WITH RADIOACTIVE SEED LOCALIZATION Left 06/21/2020   Procedure: LEFT BREAST LUMPECTOMY WITH RADIOACTIVE SEED LOCALIZATION;  Surgeon: Belinda Cough, MD;  Location: [facility];  Service: General;   Laterality: Left;   BUNIONECTOMY Bilateral 1978   COLONOSCOPY     EYE SURGERY     MELANOMA EXCISION Right 08/02/2020   Procedure: 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;  Surgeon: Belinda Cough, MD;  Location: [facility];  Service: General;  Laterality: Right;  ROOM 8 STARTING AT 04:15PM FOR 45 MIN   SENTINEL NODE BIOPSY Right 09/04/2023   Procedure: BIOPSY, LYMPH NODE, SENTINEL;  Surgeon: Vanderbilt Ned, MD;  Location: [facility];  Service: General;  Laterality: Right;  SEED LOCALIZED RIGHT AXILLARY LYMPHNODE EXCIONSAL BIOPSY    SOCIAL HISTORY: Social History   Socioeconomic History   Marital status: Single    Spouse name: Not on file   Number of children: Not on file   Years of education: Not on file   Highest education level: Not on file  Occupational History   Not on file  Tobacco Use   Smoking status: Former    Current packs/day: 0.00    Types: Cigarettes    Quit date: 05/21/2003    Years since quitting: 20.7   Smokeless tobacco: Never  Vaping Use   Vaping status: Never Used  Substance and Sexual Activity   Alcohol use: Not Currently    Comment: 3-4x per year   Drug use: No   Sexual activity: Not Currently    Birth control/protection: Post-menopausal  Other Topics Concern   Not  on file  Social History Narrative   Are you right handed or left handed? right   Are you currently employed ? no   What is your current occupation? Retired    Do you live at home alone? Alone    Who lives with you? NA    What type of home do you live in: 1 story or 2 story?  1 story        Social Drivers of Corporate investment banker Strain: Low Risk  (01/26/2024)   Received from [facility]   Overall Financial Resource Strain (CARDIA)    How hard is it for you to pay for the very basics like food, housing, medical care, and heating?: Not very hard  Food Insecurity: No Food Insecurity (01/26/2024)   Received from [facility]   Hunger Vital  Sign    Within the past 12 months, you worried that your food would run out before you got the money to buy more.: Never true    Within the past 12 months, the food you bought just didn't last and you didn't have money to get more.: Never true  Transportation Needs: No Transportation Needs (01/26/2024)   Received from [facility] - Transportation    In the past 12 months, has lack of transportation kept you from medical appointments or from getting medications?: No    In the past 12 months, has lack of transportation kept you from meetings, work, or from getting things needed for daily living?: No  Physical Activity: Sufficiently Active (01/26/2024)   Received from [facility]   Exercise Vital Sign    On average, how many days per week do you engage in moderate to strenuous exercise (like a brisk walk)?: 5 days    On average, how many minutes do you engage in exercise at this level?: 30 min  Stress: No Stress Concern Present (01/26/2024)   Received from [facility] - Occupational Stress Questionnaire    Do you feel stress - tense, restless, nervous, or anxious, or unable to sleep at night because your mind is troubled all the time - these days?: Only a little  Social Connections: Socially Integrated (01/26/2024)   Received from [facility]   Social Network    How would you rate your social network (family, work, friends)?: Good participation with social networks  Intimate Partner Violence: Not At Risk (01/26/2024)   Received from [facility]   HITS    Over the last 12 months how often did your partner physically hurt you?: Never    Over the last 12 months how often did your partner insult you or talk down to you?: Never    Over the last 12 months how often did your partner threaten you with physical harm?: Never    Over the last 12 months how often did your partner scream or curse at you?: Never    FAMILY HISTORY: Family History   Problem Relation Age of Onset   Colon cancer Mother 84       d. 19   Prostate cancer Father 69   Lung cancer Maternal Uncle        dx >50   Cancer Paternal Aunt        unknown type; mets; d. >50   Diabetes Maternal Grandfather    Diabetes Paternal Grandmother     Review of Systems  Constitutional:  Negative for appetite change, chills, fatigue, fever  and unexpected weight change.  HENT:   Negative for hearing loss, lump/mass and trouble swallowing.   Eyes:  Negative for eye problems and icterus.  Respiratory:  Negative for chest tightness, cough and shortness of breath.   Cardiovascular:  Negative for chest pain, leg swelling and palpitations.  Gastrointestinal:  Negative for abdominal distention, abdominal pain, constipation, diarrhea, nausea and vomiting.  Endocrine: Negative for hot flashes.  Genitourinary:  Negative for difficulty urinating.   Musculoskeletal:  Negative for arthralgias.  Skin:  Negative for itching and rash.  Neurological:  Negative for dizziness, extremity weakness, headaches and numbness.  Hematological:  Negative for adenopathy. Does not bruise/bleed easily.  Psychiatric/Behavioral:  Negative for depression. The patient is not nervous/anxious.       PHYSICAL EXAMINATION   Onc Performance Status - 01/31/24 1200       KPS SCALE   KPS % SCORE Able to carry on normal activity, minor s/s of disease           Vitals:   01/31/24 1206  BP: (!) 141/75  Pulse: 94  Resp: 20  Temp: 98.3 F (36.8 C)  SpO2: 96%    Physical Exam Constitutional:      General: She is not in acute distress.    Appearance: Normal appearance. She is not toxic-appearing.  HENT:     Head: Normocephalic and atraumatic.     Mouth/Throat:     Mouth: Mucous membranes are moist.     Pharynx: Oropharynx is clear. No oropharyngeal exudate or posterior oropharyngeal erythema.  Eyes:     General: No scleral icterus. Cardiovascular:     Rate and Rhythm: Normal rate and  regular rhythm.     Pulses: Normal pulses.     Heart sounds: Normal heart sounds.  Pulmonary:     Effort: Pulmonary effort is normal.     Breath sounds: Normal breath sounds.  Chest:     Comments: CTA bilaterally Abdominal:     General: Abdomen is flat. Bowel sounds are normal. There is no distension.     Palpations: Abdomen is soft.     Tenderness: There is no abdominal tenderness.  Musculoskeletal:        General: No swelling.     Cervical back: Neck supple.  Lymphadenopathy:     Cervical: No cervical adenopathy.  Skin:    General: Skin is warm and dry.     Findings: No rash.  Neurological:     General: No focal deficit present.     Mental Status: She is alert.  Psychiatric:        Mood and Affect: Mood normal.        Behavior: Behavior normal.     LABORATORY DATA:  CBC    Component Value Date/Time   WBC 8.7 01/31/2024 1131   WBC 8.6 06/07/2014 2230   RBC 4.95 01/31/2024 1131   HGB 13.6 01/31/2024 1131   HCT 39.9 01/31/2024 1131   PLT 294 01/31/2024 1131   MCV 80.6 01/31/2024 1131   MCH 27.5 01/31/2024 1131   MCHC 34.1 01/31/2024 1131   RDW 13.4 01/31/2024 1131   LYMPHSABS 1.3 01/31/2024 1131   MONOABS 1.1 (H) 01/31/2024 1131   EOSABS 0.1 01/31/2024 1131   BASOSABS 0.0 01/31/2024 1131    CMP     Component Value Date/Time   NA 139 01/31/2024 1131   K 3.9 01/31/2024 1131   CL 107 01/31/2024 1131   CO2 22 01/31/2024 1131   GLUCOSE 99 01/31/2024 1131  BUN 31 (H) 01/31/2024 1131   CREATININE 1.06 (H) 01/31/2024 1131   CALCIUM 10.0 01/31/2024 1131   PROT 7.9 01/31/2024 1131   ALBUMIN 4.1 01/31/2024 1131   AST 21 01/31/2024 1131   ALT 8 01/31/2024 1131   ALKPHOS 125 01/31/2024 1131   BILITOT 0.3 01/31/2024 1131   GFRNONAA 53 (L) 01/31/2024 1131   GFRAA 43 (L) 06/07/2014 2230     ASSESSMENT and THERAPY PLAN:  Assessment and Plan Assessment & Plan  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  Squamous cell carcinoma of the skin Undergoing immunotherapy with main side effect being fatigue and arthralgias. She will continue on adj cemiplimab .  Bone and joint pain after immunotherapy Pain occurs four to five days post-infusion and again about a week later. Tramadol  ineffective, Advil provided relief. - Use Advil for bone and joint pain as needed, but not daily.  Shortness of breath and hoarseness of voice Some improvement in shortness of breath with prednisone , but not completely resolved. Hoarseness not attributed to prednisone . - Investigate further if hoarseness persists.  Impaired vision Potentially related to prednisone  or immunotherapy per opthalmology. Ophthalmologist noted healthy eyes, provided new prescription for glasses.  - Use new prescription glasses as needed. - Follow up with ophthalmologist in December.  Thyroid  nodule Noted on recent scan, unrelated to squamous cell carcinoma. Ultrasound pending, likely benign. - Await thyroid  ultrasound results.  Coronary artery calcification Noted on CT scan. Referred to cardiologist, appointment pending. - Await cardiology evaluation.  All questions were answered. The patient knows to call the clinic with any problems, questions or concerns. We can certainly see the patient much sooner if necessary.  Total encounter time:30 minutes*in face-to-face visit time, chart review, lab review, care coordination, order entry, and documentation of the encounter time.  *Total Encounter Time as defined by the Centers for Medicare and Medicaid Services includes, in addition to the face-to-face time of a patient visit (documented in the note above) non-face-to-face time: obtaining and reviewing outside history, ordering and reviewing medications, tests or procedures, care coordination (communications with other health care professionals or caregivers) and documentation in the medical record.

## 2024-01-31 NOTE — Patient Instructions (Signed)
 [facility] - A DEPT OF [facility]  Discharge Instructions: Thank you for choosing [facility] to provide your oncology and hematology care.   If you have a lab appointment with the Cancer Center, please go directly to the Cancer Center and check in at the registration area.   Wear comfortable clothing and clothing appropriate for easy access to any Portacath or PICC line.   We strive to give you quality time with your provider. You may need to reschedule your appointment if you arrive late (15 or more minutes).  Arriving late affects you and other patients whose appointments are after yours.  Also, if you miss three or more appointments without notifying the office, you may be dismissed from the clinic at the provider's discretion.      For prescription refill requests, have your pharmacy contact our office and allow 72 hours for refills to be completed.    Today you received the following chemotherapy and/or immunotherapy agents cemiplimab       To help prevent nausea and vomiting after your treatment, we encourage you to take your nausea medication as directed.  BELOW ARE SYMPTOMS THAT SHOULD BE REPORTED IMMEDIATELY: *FEVER GREATER THAN 100.4 F (38 C) OR HIGHER *CHILLS OR SWEATING *NAUSEA AND VOMITING THAT IS NOT CONTROLLED WITH YOUR NAUSEA MEDICATION *UNUSUAL SHORTNESS OF BREATH *UNUSUAL BRUISING OR BLEEDING *URINARY PROBLEMS (pain or burning when urinating, or frequent urination) *BOWEL PROBLEMS (unusual diarrhea, constipation, pain near the anus) TENDERNESS IN MOUTH AND THROAT WITH OR WITHOUT PRESENCE OF ULCERS (sore throat, sores in mouth, or a toothache) UNUSUAL RASH, SWELLING OR PAIN  UNUSUAL VAGINAL DISCHARGE OR ITCHING   Items with * indicate a potential emergency and should be followed up as soon as possible or go to the Emergency Department if any problems should occur.  Please show the CHEMOTHERAPY ALERT CARD or IMMUNOTHERAPY  ALERT CARD at check-in to the Emergency Department and triage nurse.  Should you have questions after your visit or need to cancel or reschedule your appointment, please contact [facility] - A DEPT OF [facility]  Dept: [phone]  and follow the prompts.  Office hours are 8:00 a.m. to 4:30 p.m. Monday - Friday. Please note that voicemails left after 4:00 p.m. may not be returned until the following business day.  We are closed weekends and major holidays. You have access to a nurse at all times for urgent questions. Please call the main number to the clinic Dept: [phone] and follow the prompts.   For any non-urgent questions, you may also contact your provider using MyChart. We now offer e-Visits for anyone 71 and older to request care online for non-urgent symptoms. For details visit [email or web address].   Also download the MyChart app! Go to the app store, search "MyChart", open the app, select [facility], and log in with your MyChart username and password.

## 2024-02-01 LAB — T4: T4, Total: 7.5 ug/dL (ref 4.5–12.0)

## 2024-02-02 ENCOUNTER — Other Ambulatory Visit: Payer: Self-pay

## 2024-02-14 ENCOUNTER — Ambulatory Visit (HOSPITAL_COMMUNITY)
Admission: RE | Admit: 2024-02-14 | Discharge: 2024-02-14 | Disposition: A | Discharge status: Home / Self Care | Source: Ambulatory Visit | Attending: Hematology and Oncology | Admitting: Hematology and Oncology

## 2024-02-14 DIAGNOSIS — E041 Nontoxic single thyroid nodule: Secondary | ICD-10-CM | POA: Diagnosis present

## 2024-02-18 ENCOUNTER — Other Ambulatory Visit: Payer: Self-pay

## 2024-02-19 ENCOUNTER — Telehealth: Payer: Self-pay

## 2024-02-19 ENCOUNTER — Other Ambulatory Visit: Payer: Self-pay

## 2024-02-19 DIAGNOSIS — R0602 Shortness of breath: Secondary | ICD-10-CM

## 2024-02-19 NOTE — Progress Notes (Signed)
 Lab orders placed for appt on 02/20/2024.

## 2024-02-20 ENCOUNTER — Other Ambulatory Visit: Payer: Self-pay

## 2024-02-20 ENCOUNTER — Ambulatory Visit: Admitting: Physician Assistant

## 2024-02-20 ENCOUNTER — Encounter (HOSPITAL_COMMUNITY): Payer: Self-pay

## 2024-02-20 ENCOUNTER — Other Ambulatory Visit: Attending: Hematology and Oncology

## 2024-02-20 ENCOUNTER — Inpatient Hospital Stay (HOSPITAL_COMMUNITY)
Admission: RE | Admit: 2024-02-20 | Discharge: 2024-02-23 | DRG: 684 | Disposition: A | Discharge status: Home / Self Care | Source: Ambulatory Visit | Attending: Internal Medicine | Admitting: Internal Medicine

## 2024-02-20 ENCOUNTER — Inpatient Hospital Stay

## 2024-02-20 VITALS — BP 124/65 | HR 83 | Resp 16

## 2024-02-20 DIAGNOSIS — E66811 Obesity, class 1: Secondary | ICD-10-CM | POA: Diagnosis present

## 2024-02-20 DIAGNOSIS — C4492 Squamous cell carcinoma of skin, unspecified: Secondary | ICD-10-CM | POA: Diagnosis not present

## 2024-02-20 DIAGNOSIS — Z885 Allergy status to narcotic agent status: Secondary | ICD-10-CM

## 2024-02-20 DIAGNOSIS — I10 Essential (primary) hypertension: Secondary | ICD-10-CM | POA: Diagnosis present

## 2024-02-20 DIAGNOSIS — T502X5A Adverse effect of carbonic-anhydrase inhibitors, benzothiadiazides and other diuretics, initial encounter: Secondary | ICD-10-CM | POA: Diagnosis present

## 2024-02-20 DIAGNOSIS — Z87891 Personal history of nicotine dependence: Secondary | ICD-10-CM

## 2024-02-20 DIAGNOSIS — Z923 Personal history of irradiation: Secondary | ICD-10-CM | POA: Insufficient documentation

## 2024-02-20 DIAGNOSIS — E041 Nontoxic single thyroid nodule: Secondary | ICD-10-CM | POA: Diagnosis present

## 2024-02-20 DIAGNOSIS — M549 Dorsalgia, unspecified: Secondary | ICD-10-CM | POA: Diagnosis present

## 2024-02-20 DIAGNOSIS — E875 Hyperkalemia: Secondary | ICD-10-CM | POA: Diagnosis present

## 2024-02-20 DIAGNOSIS — R0602 Shortness of breath: Secondary | ICD-10-CM

## 2024-02-20 DIAGNOSIS — N179 Acute kidney failure, unspecified: Principal | ICD-10-CM | POA: Diagnosis present

## 2024-02-20 DIAGNOSIS — Z85820 Personal history of malignant melanoma of skin: Secondary | ICD-10-CM

## 2024-02-20 DIAGNOSIS — Z6830 Body mass index (BMI) 30.0-30.9, adult: Secondary | ICD-10-CM

## 2024-02-20 DIAGNOSIS — K219 Gastro-esophageal reflux disease without esophagitis: Secondary | ICD-10-CM | POA: Diagnosis present

## 2024-02-20 DIAGNOSIS — Z85828 Personal history of other malignant neoplasm of skin: Secondary | ICD-10-CM | POA: Diagnosis not present

## 2024-02-20 DIAGNOSIS — Z809 Family history of malignant neoplasm, unspecified: Secondary | ICD-10-CM

## 2024-02-20 DIAGNOSIS — Z801 Family history of malignant neoplasm of trachea, bronchus and lung: Secondary | ICD-10-CM

## 2024-02-20 DIAGNOSIS — Z91041 Radiographic dye allergy status: Secondary | ICD-10-CM | POA: Diagnosis not present

## 2024-02-20 DIAGNOSIS — C44529 Squamous cell carcinoma of skin of other part of trunk: Secondary | ICD-10-CM | POA: Diagnosis present

## 2024-02-20 DIAGNOSIS — Z91013 Allergy to seafood: Secondary | ICD-10-CM | POA: Diagnosis not present

## 2024-02-20 DIAGNOSIS — Z91048 Other nonmedicinal substance allergy status: Secondary | ICD-10-CM

## 2024-02-20 DIAGNOSIS — Z91030 Bee allergy status: Secondary | ICD-10-CM | POA: Diagnosis not present

## 2024-02-20 DIAGNOSIS — T451X5A Adverse effect of antineoplastic and immunosuppressive drugs, initial encounter: Secondary | ICD-10-CM | POA: Diagnosis present

## 2024-02-20 DIAGNOSIS — R0600 Dyspnea, unspecified: Secondary | ICD-10-CM | POA: Diagnosis not present

## 2024-02-20 DIAGNOSIS — G4700 Insomnia, unspecified: Secondary | ICD-10-CM | POA: Diagnosis present

## 2024-02-20 DIAGNOSIS — T39395A Adverse effect of other nonsteroidal anti-inflammatory drugs [NSAID], initial encounter: Secondary | ICD-10-CM | POA: Diagnosis present

## 2024-02-20 DIAGNOSIS — D649 Anemia, unspecified: Secondary | ICD-10-CM | POA: Insufficient documentation

## 2024-02-20 DIAGNOSIS — F32A Depression, unspecified: Secondary | ICD-10-CM | POA: Diagnosis present

## 2024-02-20 DIAGNOSIS — R0609 Other forms of dyspnea: Secondary | ICD-10-CM | POA: Diagnosis not present

## 2024-02-20 DIAGNOSIS — R5383 Other fatigue: Secondary | ICD-10-CM | POA: Diagnosis not present

## 2024-02-20 DIAGNOSIS — Z853 Personal history of malignant neoplasm of breast: Secondary | ICD-10-CM | POA: Insufficient documentation

## 2024-02-20 DIAGNOSIS — Z800 Family history of malignant neoplasm of digestive organs: Secondary | ICD-10-CM

## 2024-02-20 DIAGNOSIS — T464X5A Adverse effect of angiotensin-converting-enzyme inhibitors, initial encounter: Secondary | ICD-10-CM | POA: Diagnosis present

## 2024-02-20 DIAGNOSIS — Z79899 Other long term (current) drug therapy: Secondary | ICD-10-CM

## 2024-02-20 DIAGNOSIS — K2100 Gastro-esophageal reflux disease with esophagitis, without bleeding: Secondary | ICD-10-CM | POA: Diagnosis not present

## 2024-02-20 DIAGNOSIS — Z833 Family history of diabetes mellitus: Secondary | ICD-10-CM

## 2024-02-20 DIAGNOSIS — R531 Weakness: Secondary | ICD-10-CM

## 2024-02-20 DIAGNOSIS — G8929 Other chronic pain: Secondary | ICD-10-CM | POA: Diagnosis present

## 2024-02-20 LAB — CMP (CANCER CENTER ONLY)
ALT: 7 U/L (ref 0–44)
AST: 24 U/L (ref 15–41)
Albumin: 3.8 g/dL (ref 3.5–5.0)
Alkaline Phosphatase: 98 U/L (ref 38–126)
Anion gap: 9 (ref 5–15)
BUN: 64 mg/dL — ABNORMAL HIGH (ref 8–23)
CO2: 25 mmol/L (ref 22–32)
Calcium: 10 mg/dL (ref 8.9–10.3)
Chloride: 103 mmol/L (ref 98–111)
Creatinine: 3.43 mg/dL — ABNORMAL HIGH (ref 0.44–1.00)
GFR, Estimated: 13 mL/min — ABNORMAL LOW (ref >60)
Glucose, Bld: 96 mg/dL (ref 70–99)
Potassium: 5.4 mmol/L — ABNORMAL HIGH (ref 3.5–5.1)
Sodium: 137 mmol/L (ref 135–145)
Total Bilirubin: 0.4 mg/dL (ref 0.0–1.2)
Total Protein: 7.7 g/dL (ref 6.5–8.1)

## 2024-02-20 LAB — COMPREHENSIVE METABOLIC PANEL WITH GFR
ALT: 7 U/L (ref 0–44)
AST: 36 U/L (ref 15–41)
Albumin: 3.7 g/dL (ref 3.5–5.0)
Alkaline Phosphatase: 109 U/L (ref 38–126)
Anion gap: 13 (ref 5–15)
BUN: 60 mg/dL — ABNORMAL HIGH (ref 8–23)
CO2: 20 mmol/L — ABNORMAL LOW (ref 22–32)
Calcium: 9.3 mg/dL (ref 8.9–10.3)
Chloride: 106 mmol/L (ref 98–111)
Creatinine, Ser: 2.83 mg/dL — ABNORMAL HIGH (ref 0.44–1.00)
GFR, Estimated: 16 mL/min — ABNORMAL LOW (ref >60)
Glucose, Bld: 91 mg/dL (ref 70–99)
Potassium: 4.7 mmol/L (ref 3.5–5.1)
Sodium: 140 mmol/L (ref 135–145)
Total Bilirubin: 0.3 mg/dL (ref 0.0–1.2)
Total Protein: 7.1 g/dL (ref 6.5–8.1)

## 2024-02-20 LAB — URINALYSIS, ROUTINE W REFLEX MICROSCOPIC
Bilirubin Urine: NEGATIVE
Glucose, UA: NEGATIVE mg/dL
Hgb urine dipstick: NEGATIVE
Ketones, ur: NEGATIVE mg/dL
Nitrite: NEGATIVE
Protein, ur: NEGATIVE mg/dL
Specific Gravity, Urine: 1.009 (ref 1.005–1.030)
pH: 6 (ref 5.0–8.0)

## 2024-02-20 LAB — CBC WITH DIFFERENTIAL (CANCER CENTER ONLY)
Abs Immature Granulocytes: 0.04 K/uL (ref 0.00–0.07)
Basophils Absolute: 0 K/uL (ref 0.0–0.1)
Basophils Relative: 0 %
Eosinophils Absolute: 0 K/uL (ref 0.0–0.5)
Eosinophils Relative: 0 %
HCT: 34.8 % — ABNORMAL LOW (ref 36.0–46.0)
Hemoglobin: 11.6 g/dL — ABNORMAL LOW (ref 12.0–15.0)
Immature Granulocytes: 1 %
Lymphocytes Relative: 9 %
Lymphs Abs: 0.8 K/uL (ref 0.7–4.0)
MCH: 27.4 pg (ref 26.0–34.0)
MCHC: 33.3 g/dL (ref 30.0–36.0)
MCV: 82.1 fL (ref 80.0–100.0)
Monocytes Absolute: 0.8 K/uL (ref 0.1–1.0)
Monocytes Relative: 10 %
Neutro Abs: 7 K/uL (ref 1.7–7.7)
Neutrophils Relative %: 80 %
Platelet Count: 272 K/uL (ref 150–400)
RBC: 4.24 MIL/uL (ref 3.87–5.11)
RDW: 13.3 % (ref 11.5–15.5)
WBC Count: 8.7 K/uL (ref 4.0–10.5)
nRBC: 0 % (ref 0.0–0.2)

## 2024-02-20 LAB — MAGNESIUM: Magnesium: 1.7 mg/dL (ref 1.7–2.4)

## 2024-02-20 LAB — SAMPLE TO BLOOD BANK

## 2024-02-20 LAB — BRAIN NATRIURETIC PEPTIDE: B Natriuretic Peptide: 13 pg/mL (ref 0.0–100.0)

## 2024-02-20 LAB — ABO/RH: ABO/RH(D): O NEG

## 2024-02-20 MED ORDER — PANTOPRAZOLE SODIUM 40 MG PO TBEC
40.0000 mg | DELAYED_RELEASE_TABLET | Freq: Every day | ORAL | Status: DC
  Filled 2024-02-20: qty 1

## 2024-02-20 MED ORDER — ACETAMINOPHEN 325 MG PO TABS: 650.0000 mg | ORAL_TABLET | Freq: Four times a day (QID) | ORAL | Status: DC | PRN

## 2024-02-20 MED ORDER — SODIUM CHLORIDE 0.9 % IV SOLN: INTRAVENOUS | Status: DC

## 2024-02-20 MED ORDER — ACETAMINOPHEN 650 MG RE SUPP: 650.0000 mg | Freq: Four times a day (QID) | RECTAL | Status: DC | PRN

## 2024-02-20 MED ORDER — ATORVASTATIN CALCIUM 40 MG PO TABS
40.0000 mg | ORAL_TABLET | Freq: Every day | ORAL | Status: DC
  Administered 2024-02-21 – 2024-02-23 (×3): 40 mg via ORAL
  Filled 2024-02-20 (×4): qty 1

## 2024-02-20 MED ORDER — ONDANSETRON HCL 4 MG/2ML IJ SOLN
4.0000 mg | Freq: Four times a day (QID) | INTRAMUSCULAR | Status: DC | PRN
  Administered 2024-02-21: 4 mg via INTRAVENOUS
  Filled 2024-02-20: qty 2

## 2024-02-20 MED ORDER — TEMAZEPAM 7.5 MG PO CAPS
7.5000 mg | ORAL_CAPSULE | Freq: Every day | ORAL | Status: DC
  Administered 2024-02-20 – 2024-02-22 (×3): 7.5 mg via ORAL
  Filled 2024-02-20 (×3): qty 1

## 2024-02-20 MED ORDER — HEPARIN SODIUM (PORCINE) 5000 UNIT/ML IJ SOLN
5000.0000 [IU] | Freq: Three times a day (TID) | INTRAMUSCULAR | Status: DC
  Administered 2024-02-20 – 2024-02-23 (×9): 5000 [IU] via SUBCUTANEOUS
  Filled 2024-02-20 (×9): qty 1

## 2024-02-20 MED ORDER — SERTRALINE HCL 50 MG PO TABS
50.0000 mg | ORAL_TABLET | Freq: Every day | ORAL | Status: DC
  Filled 2024-02-20: qty 1

## 2024-02-20 MED ORDER — ONDANSETRON HCL 4 MG PO TABS: 4.0000 mg | ORAL_TABLET | Freq: Four times a day (QID) | ORAL | Status: DC | PRN

## 2024-02-20 MED ORDER — ALBUTEROL SULFATE (2.5 MG/3ML) 0.083% IN NEBU: 2.5000 mg | INHALATION_SOLUTION | RESPIRATORY_TRACT | Status: DC | PRN

## 2024-02-20 MED ORDER — SODIUM CHLORIDE 0.9 % IV SOLN: Freq: Once | INTRAVENOUS | Status: AC

## 2024-02-20 MED ORDER — PREDNISONE 20 MG PO TABS
40.0000 mg | ORAL_TABLET | Freq: Two times a day (BID) | ORAL | Status: DC
  Administered 2024-02-20 – 2024-02-21 (×2): 40 mg via ORAL
  Filled 2024-02-20 (×2): qty 2

## 2024-02-20 MED ORDER — TRAZODONE HCL 50 MG PO TABS: 25.0000 mg | ORAL_TABLET | Freq: Every evening | ORAL | Status: DC | PRN

## 2024-02-20 NOTE — Progress Notes (Signed)
 Pt direct admitted to rm [id number] at [facility]. This RN called report to Tyana RN at 14:30. Pt was transported via w/c to admissions and then to rm 1601 at 15:25 by this RN and Mallie ORN PA-C.

## 2024-02-20 NOTE — H&P (Signed)
 History and Physical  Marcia Harvey FMW:[id number] DOB: Apr 14, 1946 DOA: 02/20/2024  PCP: [name], MD   Chief Complaint: Acute kidney injury  HPI: Marcia Harvey is a 78 y.o. 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  Patient presented for evaluation at the [facility] symptom management clinic today with complaint of progressive fatigue and shortness of breath.  She had her third and most recent immunotherapy on 01/31/2024, after that she started having shortness of breath and had an outpatient CT scan of the chest without any acute findings.  She continues to have shortness of breath and palpitations with exertion.  She has also been having some nausea.  She feels she has been drinking plenty, 48 to 64 ounces of fluids daily denies any diarrhea, vomiting, any urgency or frequency or dysuria.  She has been cutting back on her ibuprofen use, she only took 2 doses over the weekend.  She takes lisinopril  hydrochlorothiazide  and has continued take this medication, last dose this morning.  She has been dealing with fatigue since her immunotherapy treatments.  She has baseline normal creatinine, on 9/19 was noted to have a creatinine of 1.06, and on lab work today, she was found to have a creatinine of 3.43 and potassium of 5.4.  She was given 1.5 L of normal saline, and hospitalist direct admission was arranged.  Review of Systems: Please see HPI for pertinent positives and negatives. A complete 10 system review of systems are otherwise negative.  Past Medical History:  Diagnosis Date   Anxiety    Anxiety    Arthritis    Cancer (HCC) 07/2020   SCC chest   Depression    Fall from horse 01/27/2012   CT chest/Abd/Pelvis done at [facility]. Findings: Nondisplaced fx of posterior right 11th rib   GERD (gastroesophageal reflux disease)    History of radiation  therapy    10/28/23-12/05/23- Dr. Lynwood Nasuti (Right Axilla)   Hypertension    Seizures (HCC)    been through testing, no issues with seizures or epilepsy   Syncopal episodes    seizure with episode   White matter disease    Past Surgical History:  Procedure Laterality Date   AXILLARY LYMPH NODE DISSECTION Right 09/04/2023   Procedure: REDGIE HARD;  Surgeon: Vanderbilt Ned, MD;  Location: [facility];  Service: General;  Laterality: Right;  GEN w/PEC BLOCK   BREAST LUMPECTOMY WITH RADIOACTIVE SEED LOCALIZATION Left 06/21/2020   Procedure: LEFT BREAST LUMPECTOMY WITH RADIOACTIVE SEED LOCALIZATION;  Surgeon: Belinda Cough, MD;  Location: [facility];  Service: General;  Laterality: Left;   BUNIONECTOMY Bilateral 1978   COLONOSCOPY     EYE SURGERY     MELANOMA EXCISION Right 08/02/2020   Procedure: 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;  Surgeon: Belinda Cough, MD;  Location: [facility];  Service: General;  Laterality: Right;  ROOM 8 STARTING AT 04:15PM FOR 45 MIN   SENTINEL NODE BIOPSY Right 09/04/2023   Procedure: BIOPSY, LYMPH NODE, SENTINEL;  Surgeon: Vanderbilt Ned, MD;  Location: [facility];  Service: General;  Laterality: Right;  SEED LOCALIZED RIGHT AXILLARY LYMPHNODE EXCIONSAL BIOPSY   Social History:  reports that she quit smoking about 20 years ago. Her smoking use included cigarettes. She has never used smokeless tobacco. She reports that she does not currently use alcohol. She reports that  she does not use drugs.  Allergies  Allergen Reactions   Bee Pollen Anaphylaxis and Hives   Codeine Hives   Gadolinium Derivatives Anaphylaxis   Iodinated Contrast Media Anaphylaxis and Other (See Comments)    Pt had reaction in 1981 to CT contrast media, pt had to be given epinephrine     Iodine Anaphylaxis, Hives and Other (See Comments)    Contrast dye   Iohexol Hives and Other (See Comments)     Treated for anaphylactic rxn. during  CT   Shellfish Protein-Containing Drug Products Shortness Of Breath   Wound Dressing Adhesive    Tape Rash and Other (See Comments)    Irritating, red rash    Family History  Problem Relation Age of Onset   Colon cancer Mother 60       d. 42   Prostate cancer Father 51   Lung cancer Maternal Uncle        dx >50   Cancer Paternal Aunt        unknown type; mets; d. >50   Diabetes Maternal Grandfather    Diabetes Paternal Grandmother      Prior to Admission medications   Medication Sig Start Date End Date Taking? Authorizing Provider  ascorbic acid (VITAMIN C) 500 MG tablet Take by mouth.    [provider]  atorvastatin (LIPITOR) 40 MG tablet Take 40 mg by mouth daily.    [provider]  augmented betamethasone dipropionate (DIPROLENE-AF) 0.05 % cream Apply topically 2 (two) times daily.    [provider]  b complex vitamins tablet Take 1 tablet by mouth daily.    [provider]  carboxymethylcellul-glycerin (OPTIVE) 0.5-0.9 % ophthalmic solution Apply to eye. 03/24/19   [provider]  Cholecalciferol (VITAMIN D3) 2000 UNITS TABS Take 2,000 Units by mouth daily.    [provider]  gabapentin  (NEURONTIN ) 300 MG capsule Take 300 mg by mouth 3 (three) times daily. Ordered by Dr. Debby Shipper for nerve pain post op. She said he told her to take 2 at a time. Patient not taking: Reported on 01/31/2024    [provider]  lisinopril -hydrochlorothiazide  (PRINZIDE ,ZESTORETIC ) 10-12.5 MG per tablet Take 1 tablet by mouth daily. NEED VISIT, LABS!!  2nd NOTICE! 10/31/12   Genaro Creston BRAVO, PA-C  methylPREDNISolone  (MEDROL  DOSEPAK) 4 MG TBPK tablet Take as directed Patient not taking: Reported on 01/31/2024 01/24/24   Iruku, Praveena, MD  omeprazole (PRILOSEC) 40 MG capsule TAKE 1 CAPSULE BY MOUTH EVERY DAY 10/12/16   [provider]  ondansetron  (ZOFRAN ) 8 MG tablet Take 1 tablet (8  mg total) by mouth every 8 (eight) hours as needed for nausea or vomiting. 12/06/23   Iruku, Praveena, MD  sertraline (ZOLOFT) 25 MG tablet TAKE 1 TABLET(25 MG) BY MOUTH DAILY 06/22/16   [provider]  traMADol  (ULTRAM ) 50 MG tablet Take 1 tablet (50 mg total) by mouth every 12 (twelve) hours as needed. 01/14/24   Iruku, Praveena, MD  traZODone (DESYREL) 50 MG tablet 25 to 50 mg at bedtime to help with sleep Patient not taking: Reported on 01/31/2024 03/12/19   [provider]    Physical Exam: BP 113/70   Pulse 63   Temp 97.7 F (36.5 C) (Oral)   Resp 18   SpO2 97%  General:  Alert, oriented, calm, in no acute distress  Eyes: EOMI, clear conjuctivae, white sclerea Neck: supple, no masses, trachea mildline  Cardiovascular: RRR, no murmurs or rubs, no peripheral edema  Respiratory:  clear to auscultation bilaterally, no wheezes, no crackles  Abdomen: soft, nontender, nondistended, normal bowel tones heard  Skin: dry, no rashes  Musculoskeletal: no joint effusions, normal range of motion  Psychiatric: appropriate affect, normal speech  Neurologic: extraocular muscles intact, clear speech, moving all extremities with intact sensorium         Labs on Admission:  Basic Metabolic Panel: Recent Labs  Lab 02/20/24 1105  NA 137  K 5.4*  CL 103  CO2 25  GLUCOSE 96  BUN 64*  CREATININE 3.43*  CALCIUM 10.0  MG 1.7   Liver Function Tests: Recent Labs  Lab 02/20/24 1105  AST 24  ALT 7  ALKPHOS 98  BILITOT 0.4  PROT 7.7  ALBUMIN 3.8   No results for input(s): LIPASE, AMYLASE in the last 168 hours. No results for input(s): AMMONIA in the last 168 hours. CBC: Recent Labs  Lab 02/20/24 1105  WBC 8.7  NEUTROABS 7.0  HGB 11.6*  HCT 34.8*  MCV 82.1  PLT 272   Cardiac Enzymes: No results for input(s): CKTOTAL, CKMB, CKMBINDEX, TROPONINI in the last 168 hours. BNP (last 3 results) No results for input(s): BNP in the last 8760 hours.  ProBNP  (last 3 results) No results for input(s): PROBNP in the last 8760 hours.  CBG: No results for input(s): GLUCAP in the last 168 hours.  Radiological Exams on Admission: No results found.  Assessment/Plan Marcia Harvey is a 78 y.o. 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   Acute renal failure-differential at this time is broad, but strongly suspect renal injury related to immunotherapy.  Her immunotherapy agent Cemiplimab  is noted to cause acute renal failure in approximately 14% of patients.  Will workup and manage as below.  No infectious symptoms, no documented hypotension however blood pressure is low normal, in the setting of some vomiting and reduced oral intake she may have some symptomatic hypotension which would also explain her progressive fatigue and would contribute to her AKI. -Inpatient admission -IV fluids -Recheck stat CMP now -Check postvoid residual as well as renal ultrasound -Discontinue lisinopril -hydrochlorothiazide  -Dr. Loretha recommends prednisone  1 mg/kg daily, started prednisone  40 mg p.o. twice daily -P.o. 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 Loretha and receiving immunotherapy for recurrent disease in her right axillary lymph node  GERD-omeprazole  Depression-sertraline  Insomnia-recently started on Restoril by her PCP which she has only taken once but seems to work better than other options -Restoril 7.5 mg p.o. nightly  DVT prophylaxis: Subcutaneous  heparin    Code Status: Full Code  Consults called: Discussed recommendations as above with her primary oncologist Dr. Loretha.  Admission status: The appropriate patient status for this patient is INPATIENT. Inpatient status is judged to be reasonable and necessary in order to provide the required intensity of service to ensure the patient's safety. The patient's presenting symptoms, physical exam findings, and initial radiographic and laboratory data in the context of their chronic comorbidities is felt to place them at high risk for further clinical deterioration. Furthermore, it is not anticipated that the patient will  be medically stable for discharge from the hospital within 2 midnights of admission.    I certify that at the point of admission it is my clinical judgment that the patient will require inpatient hospital care spanning beyond 2 midnights from the point of admission due to high intensity of service, high risk for further deterioration and high frequency of surveillance required  Time spent: 65 minutes  Marcia Martorelli CHRISTELLA Gail MD [facility] Pager [phone]  If 7PM-7AM, please contact night-coverage [email or web address] Password [email or web address]  02/20/2024, 4:57 PM

## 2024-02-20 NOTE — Progress Notes (Signed)
 Symptom Management Consult Note [facility]    Patient Care Team: [name], MD as PCP - General (Family Medicine) Georjean Darice HERO, MD as Consulting Physician (Neurology) Tyree Nanetta SAILOR, RN as Oncology Nurse Navigator Vanderbilt Ned, MD as Consulting Physician (General Surgery) Loretha Ash, MD as Consulting Physician (Hematology and Oncology) Shannon Agent, MD as Consulting Physician (Radiation Oncology)    Name / MRN / DOB: Marcia Harvey  [id number]  06/27/1945   Date of visit: 02/20/2024   Chief Complaint/Reason for visit: fatigue and shortness of breath   Current Therapy: Libtayo   Last treatment:  Day 1   Cycle 3 on 01/31/24    ASSESSMENT AND PLAN Patient is a 78 y.o. 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 Loretha.  I have viewed most recent oncology note and lab work.   #AKI Elevated BUN/creatine, acute. 64/3.43 up from 31/1.06. Chart review shows kidney function has slowly increasing over the last month. - Patient received 1500 mL NS in clinic. - Will need admission for further work up of AKI. Discussed with oncologist Dr. Loretha who agrees with plan for direct admission. She also recommends starting patient on high dose steroids 1 mg/kg with GI prophylaxis and renal US . Steroids were not started in clinic. - Patient consuming approx 48 oz fluids daily and had had 1 episode of emesis without diarrhea, no significant GI losses reported per patient. - Acute renal failure is AE of treatment (14%). - Spoke with Dr. Roxane with hospitalist service who agrees to assume care of patient and bring into the hospital for further evaluation and management.    #Dyspnea on exertion - Clear lung exam, no BLE. No hypoxia or tachycardia. Patient ambulated in clinic without hypoxia or tachycardia as well.  - Chart review shows CT chest high resolution on 01/24/24 negative for  pneumonitis. No infection or significant pericardial effusion noted. Thyroid  function has been normal as well, last checked 01/31/24. - CBC shows mild anemia today 11.6. Patient denies bleeding.  Likely treatment related. Will check BNP as well. - Patient ambulated to bathroom after first Liter of IVF and was asymptomatic.   #Hyperkalemia - Potassium today is 5.4. Patient denies being on potassium supplements. Lab did not comment on sample being hemolyzed.  - Only mildly elevated. Will defer to admitting provider for intervention vs recheck.  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, estrogen receptor negative (HCC)   07/31/2023 Cancer Staging   Staging form: Breast, AJCC 8th Edition - Clinical stage from 07/31/2023: Stage Unknown (cTX, cN1, cM0, ER-, PR-, HER2-) - Signed by Loretha Ash, MD on 07/31/2023 Stage prefix: Initial diagnosis Laterality: Right Staged by: Pathologist and managing physician Stage used in treatment planning: Yes National guidelines used in treatment planning: Yes Type of national guideline used in treatment planning: NCCN  08/12/2023 Genetic Testing   Single, 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  Report date is 08/12/2023.   The  CancerNext-Expanded gene panel offered by [facility] and includes sequencing, rearrangement, and RNA analysis for the following 76 genes: AIP, ALK, APC, ATM, AXIN2, BAP1, BARD1, BMPR1A, BRCA1, BRCA2, BRIP1, CDC73, CDH1, CDK4, CDKN1B, CDKN2A, CEBPA, CHEK2, CTNNA1, DDX41, DICER1, ETV6, FH, FLCN, GATA2, LZTR1, MAX, MBD4, MEN1, MET, MLH1, MSH2, MSH3, MSH6, MUTYH, NF1, NF2, NTHL1, PALB2, PHOX2B, PMS2, POT1, PRKAR1A, PTCH1, PTEN, RAD51C, RAD51D, RB1, RET, RUNX1, SDHA, SDHAF2, SDHB, SDHC, SDHD, SMAD4, SMARCA4, SMARCB1, SMARCE1, STK11, SUFU, TMEM127, TP53, TSC1, TSC2, VHL, and WT1 (sequencing and deletion/duplication); EGFR, HOXB13, KIT, MITF, PDGFRA, POLD1, and POLE (sequencing only); EPCAM and GREM1 (deletion/duplication only).    Squamous cell carcinoma of skin  10/28/2023 - 12/05/2023 Radiation Therapy   First Treatment Date: 2023-10-28 Last Treatment Date: 2023-12-05   Plan Name: Axilla_R Site: Axilla, Right Technique: 3D Mode: Photon Dose Per Fraction: 1.8 Gy Prescribed Dose (Delivered / Prescribed): 50.4 Gy / 50.4 Gy Prescribed Fxs (Delivered / Prescribed): 28 / 28   11/11/2023 Initial Diagnosis   Squamous cell carcinoma of skin   12/20/2023 -  Chemotherapy   Patient is on Treatment Plan : ADVANCED CUTANEOUS SQUAMOUS CELL CARCINOMA Cemiplimab  q21d         INTERVAL HISTORY  Discussed the use of AI scribe software for clinical note transcription with the patient, who gave verbal consent to proceed.    Marcia Harvey is a 78 y.o. female with oncologic history as above presenting to [facility] today with chief complaint of fatigue and shortness of breath. She is accompanied by a friend who provides additional history.  She has been experiencing worsening shortness of breath since her third treatment on January 31, 2024. The shortness of breath initially began around January 24, 2024, prompting an extra meeting and a CT scan to check for pneumonitis, which was negative. The shortness of breath  has worsened, now occurring with slight exertion, and is accompanied by a sensation of her heart racing, denies any chest pain. These symptoms improve quickly upon resting.   She reports experiencing back pain, specifically cramping in the large muscles on each side of her spine, which has been ongoing since 2019. The pain worsens with movement and is associated with increased shortness of breath. She has been prescribed tramadol  for the pain, which she takes as needed, and previously took Advil, which provided some relief. A course of prednisone  was also completed, which helped with the back pain but not the shortness of breath she reports.  She has been experiencing nausea, particularly this week, and has been using ondansetron  (Zofran ) for relief, which has been effective. Her appetite has decreased significantly since her third treatment, and she has been primarily consuming snacks and protein drinks. She attempts to drink 64 ounces of fluids daily but often reaches about 48 ounces. She has not experienced diarrhea or urinary symptoms. She had one episode of vomiting in the last week. Patient was previously taking advil PRN for pain until oncologist recommended she cut back. Patient did take Advil twice over the weekend, one dose Saturday and Sunday.   Fatigue has been a significant issue, worsening since her third treatment. It began with radiation treatments and has progressively worsened. She finds it challenging to complete daily tasks although is still able to perform ADLs. She has not noticed any swelling in her legs, denies wheezing, cough.  ROS  All other systems are reviewed and are negative for acute change except as noted in the HPI.    Allergies  Allergen Reactions   Codeine Hives   Iodinated Contrast Media Anaphylaxis    Pt had reaction in 1981 to CT contrast media, pt had to be given epinephrine     Iohexol Hives    Treated for anaphylactic rxn. 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. Findings: Nondisplaced fx of posterior right 11th rib   GERD (gastroesophageal reflux disease)    History of radiation therapy    10/28/23-12/05/23- Dr. Lynwood Nasuti (Right Axilla)   Hypertension    Seizures (HCC)    been through testing, no issues with seizures or epilepsy   Syncopal episodes    seizure with episode   White matter disease      Past Surgical History:  Procedure Laterality Date   AXILLARY LYMPH NODE DISSECTION Right 09/04/2023   Procedure: REDGIE HARD;  Surgeon: Vanderbilt Ned, MD;  Location: [facility];  Service: General;  Laterality: Right;  GEN w/PEC BLOCK   BREAST LUMPECTOMY WITH RADIOACTIVE SEED LOCALIZATION Left 06/21/2020   Procedure: LEFT BREAST LUMPECTOMY WITH RADIOACTIVE SEED LOCALIZATION;  Surgeon: Belinda Cough, MD;  Location: [facility];  Service: General;  Laterality: Left;   BUNIONECTOMY Bilateral 1978   COLONOSCOPY     EYE SURGERY     MELANOMA EXCISION Right 08/02/2020   Procedure: 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;  Surgeon: Belinda Cough, MD;  Location: [facility];  Service: General;  Laterality: Right;  ROOM 8 STARTING AT 04:15PM FOR 45 MIN   SENTINEL NODE BIOPSY Right 09/04/2023   Procedure: BIOPSY, LYMPH NODE, SENTINEL;  Surgeon: Vanderbilt Ned, MD;  Location: [facility];  Service: General;  Laterality: Right;  SEED LOCALIZED RIGHT AXILLARY LYMPHNODE EXCIONSAL BIOPSY    Social History   Socioeconomic History   Marital status: Single    Spouse name: Not on file   Number of children: Not on file   Years of  education: Not on file   Highest education level: Not on file  Occupational History   Not on file  Tobacco Use   Smoking status: Former    Current packs/day: 0.00    Types: Cigarettes    Quit date: 05/21/2003    Years since quitting: 20.7   Smokeless tobacco: Never  Vaping Use   Vaping status: Never Used  Substance and Sexual Activity   Alcohol use: Not Currently    Comment: 3-4x per year   Drug use: No   Sexual activity: Not Currently    Birth control/protection: Post-menopausal  Other Topics Concern   Not on file  Social History Narrative   Are you right handed or left handed? right   Are you currently employed ? no   What is your current occupation? Retired    Do you live at home alone? Alone    Who lives with you? NA    What type of home do  you live in: 1 story or 2 story?  1 story        Social Drivers of Corporate investment banker Strain: Low Risk  (01/26/2024)   Received from [facility]   Overall Financial Resource Strain (CARDIA)    How hard is it for you to pay for the very basics like food, housing, medical care, and heating?: Not very hard  Food Insecurity: No Food Insecurity (01/26/2024)   Received from [facility]   Hunger Vital Sign    Within the past 12 months, you worried that your food would run out before you got the money to buy more.: Never true    Within the past 12 months, the food you bought just didn't last and you didn't have money to get more.: Never true  Transportation Needs: No Transportation Needs (01/26/2024)   Received from [facility] - Transportation    In the past 12 months, has lack of transportation kept you from medical appointments or from getting medications?: No    In the past 12 months, has lack of transportation kept you from meetings, work, or from getting things needed for daily living?: No  Physical Activity: Sufficiently Active (01/26/2024)   Received from [facility]   Exercise Vital Sign    On average, how  many days per week do you engage in moderate to strenuous exercise (like a brisk walk)?: 5 days    On average, how many minutes do you engage in exercise at this level?: 30 min  Stress: No Stress Concern Present (01/26/2024)   Received from [facility] - Occupational Stress Questionnaire    Do you feel stress - tense, restless, nervous, or anxious, or unable to sleep at night because your mind is troubled all the time - these days?: Only a little  Social Connections: Socially Integrated (01/26/2024)   Received from [facility]   Social Network    How would you rate your social network (family, work, friends)?: Good participation with social networks  Intimate Partner Violence: Not At Risk (01/26/2024)   Received from [facility]   HITS    Over the last 12 months how often did your partner physically hurt you?: Never    Over the last 12 months how often did your partner insult you or talk down to you?: Never    Over the last 12 months how often did your partner threaten you with physical harm?: Never    Over the last 12 months how often did your partner scream or curse at you?: Never    Family History  Problem Relation Age of Onset   Colon cancer Mother 30       d. 61   Prostate cancer Father 43   Lung cancer Maternal Uncle        dx >50   Cancer Paternal Aunt        unknown type; mets; d. >50   Diabetes Maternal Grandfather    Diabetes Paternal Grandmother      Current Outpatient Medications:    ascorbic acid (VITAMIN C) 500 MG tablet, Take by mouth., Disp: , Rfl:    atorvastatin (LIPITOR) 40 MG tablet, Take 40 mg by mouth daily., Disp: , Rfl:    augmented betamethasone dipropionate (DIPROLENE-AF) 0.05 % cream, Apply topically 2 (two) times daily., Disp: , Rfl:    b complex vitamins tablet, Take 1 tablet by mouth daily., Disp: , Rfl:  carboxymethylcellul-glycerin (OPTIVE) 0.5-0.9 % ophthalmic solution, Apply to eye., Disp: , Rfl:     Cholecalciferol (VITAMIN D3) 2000 UNITS TABS, Take 2,000 Units by mouth daily., Disp: , Rfl:    gabapentin  (NEURONTIN ) 300 MG capsule, Take 300 mg by mouth 3 (three) times daily. Ordered by Dr. Debby Shipper for nerve pain post op. She said he told her to take 2 at a time. (Patient not taking: Reported on 01/31/2024), Disp: , Rfl:    lisinopril -hydrochlorothiazide  (PRINZIDE ,ZESTORETIC ) 10-12.5 MG per tablet, Take 1 tablet by mouth daily. NEED VISIT, LABS!!  2nd NOTICE!, Disp: 30 tablet, Rfl: 0   methylPREDNISolone  (MEDROL  DOSEPAK) 4 MG TBPK tablet, Take as directed (Patient not taking: Reported on 01/31/2024), Disp: 21 each, Rfl: 0   omeprazole (PRILOSEC) 40 MG capsule, TAKE 1 CAPSULE BY MOUTH EVERY DAY, Disp: , Rfl:    ondansetron  (ZOFRAN ) 8 MG tablet, Take 1 tablet (8 mg total) by mouth every 8 (eight) hours as needed for nausea or vomiting., Disp: 30 tablet, Rfl: 1   sertraline (ZOLOFT) 25 MG tablet, TAKE 1 TABLET(25 MG) BY MOUTH DAILY, Disp: , Rfl:    traMADol  (ULTRAM ) 50 MG tablet, Take 1 tablet (50 mg total) by mouth every 12 (twelve) hours as needed., Disp: 30 tablet, Rfl: 0   traZODone (DESYREL) 50 MG tablet, 25 to 50 mg at bedtime to help with sleep (Patient not taking: Reported on 01/31/2024), Disp: , Rfl:   PHYSICAL EXAM ECOG FS:1 - Symptomatic but completely ambulatory    Vitals:   02/20/24 1155 02/20/24 1200 02/20/24 1205 02/20/24 1323  BP: 106/60   124/65  Pulse: 61 88 83   Resp: 16     SpO2: 99% 100% 98%    Physical Exam Vitals and nursing note reviewed.  Constitutional:      Appearance: She is not ill-appearing or toxic-appearing.  HENT:     Head: Normocephalic.     Mouth/Throat:     Mouth: Mucous membranes are dry.  Eyes:     Conjunctiva/sclera: Conjunctivae normal.  Cardiovascular:     Rate and Rhythm: Normal rate and regular rhythm.     Pulses: Normal pulses.     Heart sounds: Normal heart sounds.  Pulmonary:     Effort: Pulmonary effort is normal.     Breath  sounds: Normal breath sounds.  Abdominal:     General: There is no distension.  Musculoskeletal:     Cervical back: Normal range of motion.     Right lower leg: No edema.     Left lower leg: No edema.  Skin:    General: Skin is warm and dry.  Neurological:     Mental Status: She is alert.        LABORATORY DATA I have reviewed the data as listed    Latest Ref Rng & Units 02/20/2024   11:05 AM 01/31/2024   11:31 AM 01/10/2024   11:09 AM  CBC  WBC 4.0 - 10.5 K/uL 8.7  8.7  7.3   Hemoglobin 12.0 - 15.0 g/dL 88.3  86.3  87.5   Hematocrit 36.0 - 46.0 % 34.8  39.9  37.9   Platelets 150 - 400 K/uL 272  294  269         Latest Ref Rng & Units 02/20/2024   11:05 AM 01/31/2024   11:31 AM 01/10/2024   11:09 AM  CMP  Glucose 70 - 99 mg/dL 96  99  89   BUN 8 - 23 mg/dL 64  31  30   Creatinine 0.44 - 1.00 mg/dL 6.56  8.93  9.15   Sodium 135 - 145 mmol/L 137  139  140   Potassium 3.5 - 5.1 mmol/L 5.4  3.9  4.3   Chloride 98 - 111 mmol/L 103  107  106   CO2 22 - 32 mmol/L 25  22  26    Calcium 8.9 - 10.3 mg/dL 89.9  89.9  9.3   Total Protein 6.5 - 8.1 g/dL 7.7  7.9  7.4   Total Bilirubin 0.0 - 1.2 mg/dL 0.4  0.3  0.4   Alkaline Phos 38 - 126 U/L 98  125  130   AST 15 - 41 U/L 24  21  29    ALT 0 - 44 U/L 7  8  10         RADIOGRAPHIC STUDIES (from last 24 hours if applicable) I have personally reviewed the radiological images as listed and agreed with the findings in the report. No results found.      Visit Diagnosis: 1. Shortness of breath   2. Squamous cell carcinoma of skin   3. Fatigue, unspecified type      Orders Placed This Encounter  Procedures   BNP (Brain natriuretic peptide)    Standing Status:   Future    Number of Occurrences:   1    Expiration Date:   02/19/2025    All questions were answered. The patient knows to call the clinic with any problems, questions or concerns. No barriers to learning was detected.  A total of more than 40 minutes were  spent on this encounter with face-to-face time and non-face-to-face time, including preparing to see the patient, ordering tests and/or medications, counseling the patient and coordination of care as outlined above.    Thank you for allowing me to participate in the care of this patient.    Laurel Smeltz E  Walisiewicz, PA-C Department of Hematology/Oncology [facility] Phone: [phone]  [phone]    02/20/2024 2:34 PM

## 2024-02-20 NOTE — Patient Instructions (Signed)

## 2024-02-21 ENCOUNTER — Inpatient Hospital Stay

## 2024-02-21 ENCOUNTER — Encounter (HOSPITAL_COMMUNITY): Payer: Self-pay | Admitting: Internal Medicine

## 2024-02-21 ENCOUNTER — Other Ambulatory Visit

## 2024-02-21 ENCOUNTER — Ambulatory Visit: Admitting: Adult Health

## 2024-02-21 ENCOUNTER — Ambulatory Visit: Admitting: Hematology and Oncology

## 2024-02-21 ENCOUNTER — Inpatient Hospital Stay (HOSPITAL_COMMUNITY)

## 2024-02-21 DIAGNOSIS — C4492 Squamous cell carcinoma of skin, unspecified: Secondary | ICD-10-CM

## 2024-02-21 DIAGNOSIS — R0600 Dyspnea, unspecified: Secondary | ICD-10-CM | POA: Diagnosis present

## 2024-02-21 DIAGNOSIS — G8929 Other chronic pain: Secondary | ICD-10-CM | POA: Diagnosis present

## 2024-02-21 DIAGNOSIS — K219 Gastro-esophageal reflux disease without esophagitis: Secondary | ICD-10-CM | POA: Diagnosis present

## 2024-02-21 DIAGNOSIS — N179 Acute kidney failure, unspecified: Secondary | ICD-10-CM | POA: Diagnosis not present

## 2024-02-21 LAB — BASIC METABOLIC PANEL WITH GFR
Anion gap: 12 (ref 5–15)
BUN: 49 mg/dL — ABNORMAL HIGH (ref 8–23)
CO2: 21 mmol/L — ABNORMAL LOW (ref 22–32)
Calcium: 9.3 mg/dL (ref 8.9–10.3)
Chloride: 108 mmol/L (ref 98–111)
Creatinine, Ser: 2.16 mg/dL — ABNORMAL HIGH (ref 0.44–1.00)
GFR, Estimated: 23 mL/min — ABNORMAL LOW (ref >60)
Glucose, Bld: 88 mg/dL (ref 70–99)
Potassium: 4.3 mmol/L (ref 3.5–5.1)
Sodium: 140 mmol/L (ref 135–145)

## 2024-02-21 LAB — CBC
HCT: 37.3 % (ref 36.0–46.0)
Hemoglobin: 11.2 g/dL — ABNORMAL LOW (ref 12.0–15.0)
MCH: 26.2 pg (ref 26.0–34.0)
MCHC: 30 g/dL (ref 30.0–36.0)
MCV: 87.1 fL (ref 80.0–100.0)
Platelets: 253 K/uL (ref 150–400)
RBC: 4.28 MIL/uL (ref 3.87–5.11)
RDW: 13.3 % (ref 11.5–15.5)
WBC: 5 K/uL (ref 4.0–10.5)
nRBC: 0 % (ref 0.0–0.2)

## 2024-02-21 MED ORDER — SODIUM CHLORIDE 0.9 % IV SOLN: INTRAVENOUS | Status: DC

## 2024-02-21 MED ORDER — TRAMADOL HCL 50 MG PO TABS
50.0000 mg | ORAL_TABLET | Freq: Four times a day (QID) | ORAL | Status: DC | PRN
  Administered 2024-02-21 – 2024-02-23 (×2): 50 mg via ORAL
  Filled 2024-02-21 (×2): qty 1

## 2024-02-21 MED ORDER — PANTOPRAZOLE SODIUM 40 MG PO TBEC
40.0000 mg | DELAYED_RELEASE_TABLET | Freq: Two times a day (BID) | ORAL | Status: DC
  Administered 2024-02-21: 40 mg via ORAL
  Filled 2024-02-21: qty 1

## 2024-02-21 MED ORDER — METHOCARBAMOL 500 MG PO TABS
500.0000 mg | ORAL_TABLET | Freq: Three times a day (TID) | ORAL | Status: DC
  Administered 2024-02-21 – 2024-02-23 (×5): 500 mg via ORAL
  Filled 2024-02-21 (×6): qty 1

## 2024-02-21 MED ORDER — SERTRALINE HCL 25 MG PO TABS
25.0000 mg | ORAL_TABLET | Freq: Every day | ORAL | Status: DC
  Administered 2024-02-21 – 2024-02-23 (×3): 25 mg via ORAL
  Filled 2024-02-21 (×3): qty 1

## 2024-02-21 NOTE — Plan of Care (Signed)
  Problem: Coping: Goal: Level of anxiety will decrease Outcome: Progressing   Problem: Activity: Goal: Risk for activity intolerance will decrease Outcome: Completed/Met   Problem: Nutrition: Goal: Adequate nutrition will be maintained Outcome: Completed/Met

## 2024-02-21 NOTE — Progress Notes (Signed)
 [facility]                                                                              Marcia Harvey, is a 78 y.o. female, DOB - 11-15-1945, FMW:[id number] Admit date - 02/20/2024    Outpatient Primary MD for the patient is Marcia Lenis, MD  LOS - 1  days  No chief complaint on file.      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  Patient presented to [facility] on 10[facility]9 with complaint of progressive fatigue and shortness of breath.  She had her thyroid  and most recent immunotherapy on 01/31/2024, after that she started having intermittent shortness of breath and had an outpatient CT scan of the chest without any acute findings.  Patient reported shortness of breath and palpitations with exertion, intermittent nausea although she has been eating and drinking.  She has been cutting back on the ibuprofen use, takes lisinopril -HCTZ for BP, last dose on the day of admission 02/20/2024. On 9/19 she had a creatinine of 1.06 and lab workup on admission showed creatinine of 3.43, potassium 5.4. Patient was admitted for further workup.  Assessment & Plan      AKI (acute kidney injury) -In the setting of NSAIDs, lisinopril , HCTZ use, immunotherapy agents, probable poor p.o. intake due to nausea.  No excessive vomiting or diarrhea. - No NSAIDs, lisinopril  HCTZ held.  UA negative for UTI - Renal ultrasound pending -On prednisone , 40 mg twice daily per oncology due to possible concern for nephritis - Creatinine improving, 2.1 today, will continue IVF  Hyperkalemia - Potassium 5.4 on admission, received Lokelma, currently resolved  Intermittent shortness of breath with palpitations, exertional - Unclear etiology, BNP 13.0.  No hypoxia or tachycardia. - Lungs clear, no wheezing or rales, HRCT on 01/24/2024 had shown no acute intrapulmonary abnormality to suggest acute pneumonitis, several  nonspecific bilateral scattered pulmonary micronodules. - Continue steroids - Discussed with patient's oncologist, Dr. Loretha, does not suspect PE at this time - Will check 2D echo.  EKG normal    Squamous cell carcinoma of skin - Management per oncology, undergoing immunotherapy  GERD Continue PPI  Depression Continue sertraline   Hypertension -Currently BP stable, lisinopril , HCTZ held - If BP starts to rise, will place on low-dose Norvasc  Insomnia - Continue Restoril  Chronic back pain - Currently stable -Avoid NSAIDs, will place on tramadol  and Robaxin     Estimated body mass index is 30.2 kg/m (pended) as calculated from the following:   Height as of 01/02/24: (P) 5' 3 (1.6 m).   Weight as of 01/31/24: 77.3 kg.  Code Status: Full code DVT Prophylaxis:  heparin injection 5,000 Units Start: 02/20/24 1745   Level of Care: Level of care: Med-Surg Family Communication: Updated patient's family member at the bedside Disposition Plan:      Remains inpatient appropriate:      Procedures:    Consultants:   Oncology  Antimicrobials:   Anti-infectives (From admission, onward)    None          Medications  atorvastatin  40 mg Oral Daily   heparin  5,000 Units Subcutaneous Q8H   pantoprazole  40 mg Oral BID AC   predniSONE   40 mg Oral BID WC   sertraline  25 mg Oral Daily   temazepam  7.5 mg Oral QHS      Subjective:   Marcia Harvey was seen and examined today.  Currently no acute complaints however per patient, this morning while she was coming out from the bathroom, she had intermittent nausea with shortness of breath.  Having these episodes off and on outpatient as well since starting immunotherapy.  No vomiting, abdominal pain, diarrhea or chest pain.  Objective:   Vitals:   02/20/24 1548 02/20/24 2050 02/21/24 0607  BP: 113/70 121/67 123/64  Pulse: 63 80 70  Resp: 18 16   Temp: 97.7 F (36.5 C) 98.3 F (36.8 C) 97.9 F (36.6 C)   TempSrc: Oral    SpO2: 97% 94% 100%    Intake/Output Summary (Last 24 hours) at 02/21/2024 1142 Last data filed at 02/21/2024 0627 Gross per 24 hour  Intake 1628.68 ml  Output --  Net 1628.68 ml     Wt Readings from Last 3 Encounters:  01/31/24 77.3 kg  01/24/24 77.5 kg  01/10/24 78.6 kg     Exam General: Alert and oriented x 3, NAD Cardiovascular: S1 S2 auscultated,  RRR Respiratory: Clear to auscultation bilaterally, no wheezing, rales  Gastrointestinal: Soft, nontender, nondistended, + bowel sounds Ext: no pedal edema bilaterally Neuro: No new deficits Psych: Normal affect     Data Reviewed:  I have personally reviewed following labs    CBC Lab Results  Component Value Date   WBC 5.0 02/21/2024   RBC 4.28 02/21/2024   HGB 11.2 (L) 02/21/2024   HCT 37.3 02/21/2024   MCV 87.1 02/21/2024   MCH 26.2 02/21/2024   PLT 253 02/21/2024   MCHC 30.0 02/21/2024   RDW 13.3 02/21/2024   LYMPHSABS 0.8 02/20/2024   MONOABS 0.8 02/20/2024   EOSABS 0.0 02/20/2024   BASOSABS 0.0 02/20/2024     Last metabolic panel Lab Results  Component Value Date   NA 140 02/21/2024   K 4.3 02/21/2024   CL 108 02/21/2024   CO2 21 (L) 02/21/2024   BUN 49 (H) 02/21/2024   CREATININE 2.16 (H) 02/21/2024   GLUCOSE 88 02/21/2024   GFRNONAA 23 (L) 02/21/2024   GFRAA 43 (L) 06/07/2014   CALCIUM 9.3 02/21/2024   PROT 7.1 02/20/2024   ALBUMIN 3.7 02/20/2024   BILITOT 0.3 02/20/2024   ALKPHOS 109 02/20/2024   AST 36 02/20/2024   ALT 7 02/20/2024   ANIONGAP 12 02/21/2024    CBG (last 3)  No results for input(s): GLUCAP in the last 72 hours.    Coagulation Profile: No results for input(s): INR, PROTIME in the last 168 hours.   Radiology Studies: I have personally reviewed the imaging studies  No results found.     Marcia Harvey M.D. [facility] 02/21/2024, 11:42 AM  Available via Epic secure chat 7am-7pm After 7 pm, please refer to night coverage  provider listed on amion.

## 2024-02-21 NOTE — Progress Notes (Signed)
 [facility] Cancer Follow up:    [name], MD [address] [place] [address]   DIAGNOSIS:  Cancer Staging  No matching staging information was found for the patient.    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, estrogen receptor negative (HCC)   07/31/2023 Cancer Staging   Staging form: Breast, AJCC 8th Edition - Clinical stage from 07/31/2023: Stage Unknown (cTX, cN1, cM0, ER-, PR-, HER2-) - Signed by Loretha Ash, MD on 07/31/2023 Stage prefix: Initial diagnosis Laterality: Right Staged by: Pathologist and managing physician Stage used in treatment planning: Yes National guidelines used in treatment planning: Yes Type of national guideline used in treatment planning: NCCN   08/12/2023 Genetic Testing   Single, 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  Report date is 08/12/2023.   The CancerNext-Expanded gene panel offered by [facility] and includes sequencing, rearrangement, and RNA analysis for the following 76 genes: AIP, ALK, APC, ATM, AXIN2, BAP1, BARD1, BMPR1A, BRCA1, BRCA2, BRIP1, CDC73, CDH1, CDK4, CDKN1B, CDKN2A, CEBPA, CHEK2, CTNNA1, DDX41, DICER1, ETV6, FH, FLCN, GATA2, LZTR1, MAX, MBD4, MEN1, MET, MLH1, MSH2, MSH3, MSH6, MUTYH, NF1, NF2, NTHL1, PALB2, PHOX2B, PMS2, POT1, PRKAR1A, PTCH1, PTEN, RAD51C, RAD51D, RB1,  RET, RUNX1, SDHA, SDHAF2, SDHB, SDHC, SDHD, SMAD4, SMARCA4, SMARCB1, SMARCE1, STK11, SUFU, TMEM127, TP53, TSC1, TSC2, VHL, and WT1 (sequencing and deletion/duplication); EGFR, HOXB13, KIT, MITF, PDGFRA, POLD1, and POLE (sequencing only); EPCAM and GREM1 (deletion/duplication only).    Squamous cell carcinoma of skin  10/28/2023 - 12/05/2023 Radiation Therapy   First Treatment Date: 2023-10-28 Last Treatment Date: 2023-12-05   Plan Name: Axilla_R Site: Axilla, Right Technique: 3D Mode: Photon Dose Per Fraction: 1.8 Gy Prescribed Dose (Delivered / Prescribed): 50.4 Gy / 50.4 Gy Prescribed Fxs (Delivered / Prescribed): 28 / 28   11/11/2023 Initial Diagnosis   Squamous cell carcinoma of skin   12/20/2023 -  Chemotherapy   Patient is on Treatment Plan : ADVANCED CUTANEOUS SQUAMOUS CELL CARCINOMA Cemiplimab  q21d       CURRENT THERAPY: Cemiplimab   INTERVAL HISTORY: History of Present Illness   Marcia Harvey is a 78 year old female on adj cemiplimab  for SCC skin who is admitted with AKI She is doing better, feeling well, ate some chicken salad today She tells me that she had this one episode of SOB today morning along with some nausea but this has since resolved She hasn't had a BM today, but passing gas. No other complaints.   Patient Active Problem List   Diagnosis Date Noted   GERD (gastroesophageal reflux disease) 02/21/2024   Chronic back pain 02/21/2024   Dyspnea 02/21/2024   AKI (acute kidney injury) 02/20/2024   Squamous cell carcinoma of skin 11/11/2023   Recurrent skin cancer 10/02/2023   Genetic testing 08/21/2023   Spondylosis without myelopathy or radiculopathy, cervical region 07/27/2019   Other intervertebral disc degeneration, lumbar region 07/27/2019   Fall from horse 01/27/2012  is allergic to bee pollen, bee venom, codeine, iodinated contrast media, iodine, iohexol, shellfish protein-containing drug products, tape, and wound dressing adhesive.  MEDICAL  HISTORY: Past Medical History:  Diagnosis Date   Anxiety    Anxiety    Arthritis    Cancer (HCC) 07/2020   SCC chest   Depression    Fall from horse 01/27/2012   CT chest/Abd/Pelvis done at [facility]. Findings: Nondisplaced fx of posterior right 11th rib   GERD (gastroesophageal reflux disease)    History of radiation therapy    10/28/23-12/05/23- Dr. Lynwood Nasuti (Right Axilla)   Hypertension    Seizures (HCC)    been through testing, no issues with seizures or epilepsy   Syncopal episodes    seizure with episode   White matter disease     SURGICAL HISTORY: Past Surgical History:  Procedure Laterality Date   AXILLARY LYMPH NODE DISSECTION Right 09/04/2023   Procedure: REDGIE HARD;  Surgeon: Vanderbilt Ned, MD;  Location: [facility];  Service: General;  Laterality: Right;  GEN w/PEC BLOCK   BREAST LUMPECTOMY WITH RADIOACTIVE SEED LOCALIZATION Left 06/21/2020   Procedure: LEFT BREAST LUMPECTOMY WITH RADIOACTIVE SEED LOCALIZATION;  Surgeon: Belinda Cough, MD;  Location: [facility];  Service: General;  Laterality: Left;   BUNIONECTOMY Bilateral 1978   COLONOSCOPY     EYE SURGERY     MELANOMA EXCISION Right 08/02/2020   Procedure: 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;  Surgeon: Belinda Cough, MD;  Location: [facility];  Service: General;  Laterality: Right;  ROOM 8 STARTING AT 04:15PM FOR 45 MIN   SENTINEL NODE BIOPSY Right 09/04/2023   Procedure: BIOPSY, LYMPH NODE, SENTINEL;  Surgeon: Vanderbilt Ned, MD;  Location: [facility];  Service: General;  Laterality: Right;  SEED LOCALIZED RIGHT AXILLARY LYMPHNODE EXCIONSAL BIOPSY    SOCIAL HISTORY: Social History   Socioeconomic History   Marital status: Single    Spouse name: Not on file   Number of children: Not on file   Years of education: Not on file   Highest education level: Not on file  Occupational History   Not on file   Tobacco Use   Smoking status: Former    Current packs/day: 0.00    Types: Cigarettes    Quit date: 05/21/2003    Years since quitting: 20.7   Smokeless tobacco: Never  Vaping Use   Vaping status: Never Used  Substance and Sexual Activity   Alcohol use: Not Currently    Comment: 3-4x per year   Drug use: No   Sexual activity: Not Currently    Birth control/protection: Post-menopausal  Other Topics Concern   Not on file  Social History Narrative   Are you right handed or left handed? right   Are you currently employed ? no   What is your current occupation? Retired    Do you live at home alone? Alone    Who lives with you? NA    What type of home do you live in: 1 story or 2 story?  1 story        Social Drivers of Corporate investment banker Strain: Low Risk  (01/26/2024)   Received from [facility]   Overall Financial Resource Strain (CARDIA)    How hard is it for you to pay for the very basics like food, housing, medical care, and heating?: Not very hard  Food Insecurity: No Food Insecurity (02/20/2024)   Hunger  Vital Sign    Worried About Programme researcher, broadcasting/film/video in the Last Year: Never true    Ran Out of Food in the Last Year: Never true  Transportation Needs: No Transportation Needs (02/20/2024)   PRAPARE - Administrator, Civil Service (Medical): No    Lack of Transportation (Non-Medical): No  Physical Activity: Sufficiently Active (01/26/2024)   Received from [facility]   Exercise Vital Sign    On average, how many days per week do you engage in moderate to strenuous exercise (like a brisk walk)?: 5 days    On average, how many minutes do you engage in exercise at this level?: 30 min  Stress: No Stress Concern Present (01/26/2024)   Received from [facility] - Occupational Stress Questionnaire    Do you feel stress - tense, restless, nervous, or anxious, or unable to sleep at night because your mind is troubled  all the time - these days?: Only a little  Social Connections: Unknown (02/20/2024)   Social Connection and Isolation Panel    Frequency of Communication with Friends and Family: Not on file    Frequency of Social Gatherings with Friends and Family: Not on file    Attends Religious Services: Not on file    Active Member of Clubs or Organizations: Not on file    Attends Banker Meetings: Not on file    Marital Status: Never married  Intimate Partner Violence: Not At Risk (02/20/2024)   Humiliation, Afraid, Rape, and Kick questionnaire    Fear of Current or Ex-Partner: No    Emotionally Abused: No    Physically Abused: No    Sexually Abused: No    FAMILY HISTORY: Family History  Problem Relation Age of Onset   Colon cancer Mother 70       d. 67   Prostate cancer Father 39   Lung cancer Maternal Uncle        dx >50   Cancer Paternal Aunt        unknown type; mets; d. >50   Diabetes Maternal Grandfather    Diabetes Paternal Grandmother     Review of Systems  Constitutional:  Negative for appetite change, chills, fatigue, fever and unexpected weight change.  HENT:   Negative for hearing loss, lump/mass and trouble swallowing.   Eyes:  Negative for eye problems and icterus.  Respiratory:  Negative for chest tightness, cough and shortness of breath.   Cardiovascular:  Negative for chest pain, leg swelling and palpitations.  Gastrointestinal:  Negative for abdominal distention, abdominal pain, constipation, diarrhea, nausea and vomiting.  Endocrine: Negative for hot flashes.  Genitourinary:  Negative for difficulty urinating.   Musculoskeletal:  Negative for arthralgias.  Skin:  Negative for itching and rash.  Neurological:  Negative for dizziness, extremity weakness, headaches and numbness.  Hematological:  Negative for adenopathy. Does not bruise/bleed easily.  Psychiatric/Behavioral:  Negative for depression. The patient is not nervous/anxious.       PHYSICAL  EXAMINATION      Vitals:   02/21/24 0607 02/21/24 1309  BP: 123/64 125/79  Pulse: 70 74  Resp:  20  Temp: 97.9 F (36.6 C) 98.6 F (37 C)  SpO2: 100% 95%    Physical Exam Constitutional:      General: She is not in acute distress.    Appearance: Normal appearance.  HENT:     Head: Normocephalic and atraumatic.  Pulmonary:  Effort: Pulmonary effort is normal.     Breath sounds: Normal breath sounds.  Chest:     Comments: CTA bilaterally Abdominal:     General: Abdomen is flat. Bowel sounds are normal. There is no distension.     Palpations: Abdomen is soft.     Tenderness: There is no abdominal tenderness.  Musculoskeletal:        General: No swelling.     Cervical back: Neck supple.  Lymphadenopathy:     Cervical: No cervical adenopathy.  Skin:    General: Skin is warm and dry.  Neurological:     General: No focal deficit present.     Mental Status: She is alert.  Psychiatric:        Mood and Affect: Mood normal.        Behavior: Behavior normal.     LABORATORY DATA:  CBC    Component Value Date/Time   WBC 5.0 02/21/2024 0539   RBC 4.28 02/21/2024 0539   HGB 11.2 (L) 02/21/2024 0539   HGB 11.6 (L) 02/20/2024 1105   HCT 37.3 02/21/2024 0539   PLT 253 02/21/2024 0539   PLT 272 02/20/2024 1105   MCV 87.1 02/21/2024 0539   MCH 26.2 02/21/2024 0539   MCHC 30.0 02/21/2024 0539   RDW 13.3 02/21/2024 0539   LYMPHSABS 0.8 02/20/2024 1105   MONOABS 0.8 02/20/2024 1105   EOSABS 0.0 02/20/2024 1105   BASOSABS 0.0 02/20/2024 1105    CMP     Component Value Date/Time   NA 140 02/21/2024 0539   K 4.3 02/21/2024 0539   CL 108 02/21/2024 0539   CO2 21 (L) 02/21/2024 0539   GLUCOSE 88 02/21/2024 0539   BUN 49 (H) 02/21/2024 0539   CREATININE 2.16 (H) 02/21/2024 0539   CREATININE 3.43 (H) 02/20/2024 1105   CALCIUM 9.3 02/21/2024 0539   PROT 7.1 02/20/2024 1646   ALBUMIN 3.7 02/20/2024 1646   AST 36 02/20/2024 1646   AST 24 02/20/2024 1105   ALT 7  02/20/2024 1646   ALT 7 02/20/2024 1105   ALKPHOS 109 02/20/2024 1646   BILITOT 0.3 02/20/2024 1646   BILITOT 0.4 02/20/2024 1105   GFRNONAA 23 (L) 02/21/2024 0539   GFRNONAA 13 (L) 02/20/2024 1105   GFRAA 43 (L) 06/07/2014 2230     ASSESSMENT and THERAPY PLAN:  Assessment and Plan Assessment & Plan  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  Squamous cell carcinoma of the skin Undergoing immunotherapy with main side effect being fatigue and arthralgias. She is admitted with AKI, likely prerenal. She didn't eat well or drink fluids for the past one week atleast. BUN and creatinine improving with IVF Will discontinue steroids, less likely immune mediated nephritis. US  renal pending.  Thyroid  nodule Noted on recent scan, unrelated to squamous cell carcinoma. US  showed solitary 2.8 cm left inferior TR 4 thyroid  nodule. FNA recommended, will arrange for this outpt once discharged. This is non urgent.  SOB, she had one episode of SOB she said No distress, CTA bilaterally ECHO is reasonable. Low suspicion for PE, can consider if she shows other symptoms or signs. HRCT done early sep with no pneumonitis.  All questions were answered. The patient knows to call the clinic with any problems, questions or concerns. We can certainly see the patient much sooner if necessary.  Total encounter time:30 minutes*in face-to-face visit time, chart review, lab review, care coordination, order entry, and documentation of the encounter  time.  *Total Encounter Time as defined by the Centers for Medicare and Medicaid Services includes, in addition to the face-to-face time of a patient visit (documented in the note above) non-face-to-face time: obtaining and reviewing outside history, ordering and reviewing medications, tests or procedures, care coordination (communications with other health care professionals or  caregivers) and documentation in the medical record.

## 2024-02-21 NOTE — Plan of Care (Signed)
  Problem: Clinical Measurements: Goal: Ability to maintain clinical measurements within normal limits will improve Outcome: Progressing Goal: Will remain free from infection Outcome: Progressing Goal: Diagnostic test results will improve Outcome: Progressing   Problem: Elimination: Goal: Will not experience complications related to bowel motility Outcome: Progressing Goal: Will not experience complications related to urinary retention Outcome: Progressing   Problem: Safety: Goal: Ability to remain free from injury will improve Outcome: Progressing   Problem: Skin Integrity: Goal: Risk for impaired skin integrity will decrease Outcome: Progressing

## 2024-02-21 NOTE — Progress Notes (Signed)
   02/21/24 1033  TOC Brief Assessment  Insurance and Status Reviewed  Patient has primary care physician Yes  Home environment has been reviewed home alone  Prior level of function: independent  Prior/Current Home Services No current home services  Social Drivers of Health Review SDOH reviewed no interventions necessary  Readmission risk has been reviewed Yes  Transition of care needs no transition of care needs at this time

## 2024-02-22 ENCOUNTER — Inpatient Hospital Stay (HOSPITAL_COMMUNITY)

## 2024-02-22 DIAGNOSIS — R0609 Other forms of dyspnea: Secondary | ICD-10-CM | POA: Diagnosis not present

## 2024-02-22 DIAGNOSIS — R0600 Dyspnea, unspecified: Secondary | ICD-10-CM

## 2024-02-22 DIAGNOSIS — N179 Acute kidney failure, unspecified: Secondary | ICD-10-CM | POA: Diagnosis not present

## 2024-02-22 DIAGNOSIS — C4492 Squamous cell carcinoma of skin, unspecified: Secondary | ICD-10-CM | POA: Diagnosis not present

## 2024-02-22 DIAGNOSIS — K2100 Gastro-esophageal reflux disease with esophagitis, without bleeding: Secondary | ICD-10-CM | POA: Diagnosis not present

## 2024-02-22 LAB — RENAL FUNCTION PANEL
Albumin: 3.6 g/dL (ref 3.5–5.0)
Anion gap: 11 (ref 5–15)
BUN: 40 mg/dL — ABNORMAL HIGH (ref 8–23)
CO2: 20 mmol/L — ABNORMAL LOW (ref 22–32)
Calcium: 9.2 mg/dL (ref 8.9–10.3)
Chloride: 110 mmol/L (ref 98–111)
Creatinine, Ser: 1.57 mg/dL — ABNORMAL HIGH (ref 0.44–1.00)
GFR, Estimated: 33 mL/min — ABNORMAL LOW (ref >60)
Glucose, Bld: 104 mg/dL — ABNORMAL HIGH (ref 70–99)
Phosphorus: 2.1 mg/dL — ABNORMAL LOW (ref 2.5–4.6)
Potassium: 4.1 mmol/L (ref 3.5–5.1)
Sodium: 141 mmol/L (ref 135–145)

## 2024-02-22 LAB — CBC
HCT: 34.3 % — ABNORMAL LOW (ref 36.0–46.0)
Hemoglobin: 10.7 g/dL — ABNORMAL LOW (ref 12.0–15.0)
MCH: 26.9 pg (ref 26.0–34.0)
MCHC: 31.2 g/dL (ref 30.0–36.0)
MCV: 86.2 fL (ref 80.0–100.0)
Platelets: 257 K/uL (ref 150–400)
RBC: 3.98 MIL/uL (ref 3.87–5.11)
RDW: 13.2 % (ref 11.5–15.5)
WBC: 6 K/uL (ref 4.0–10.5)
nRBC: 0 % (ref 0.0–0.2)

## 2024-02-22 LAB — ECHOCARDIOGRAM COMPLETE
Area-P 1/2: 3.33 cm2
S' Lateral: 2.1 cm

## 2024-02-22 NOTE — Plan of Care (Signed)
  Problem: Education: Goal: Knowledge of General Education information will improve Description: Including pain rating scale, medication(s)/side effects and non-pharmacologic comfort measures Outcome: Progressing   Problem: Health Behavior/Discharge Planning: Goal: Ability to manage health-related needs will improve Outcome: Progressing   Problem: Clinical Measurements: Goal: Ability to maintain clinical measurements within normal limits will improve Outcome: Progressing   Problem: Pain Managment: Goal: General experience of comfort will improve and/or be controlled Outcome: Progressing   Problem: Safety: Goal: Ability to remain free from injury will improve Outcome: Progressing

## 2024-02-22 NOTE — Evaluation (Signed)
 Physical Therapy Brief Evaluation and Discharge Note Patient Details Name: Marcia Harvey MRN: [id number] DOB: 10-02-1945 Today's Date: 02/22/2024   History of Present Illness  Pt is a 78yo female presenting with SCC on immunotherpay admitted with acute renal failure, progressive fatigue and SOB, and heart palpitations with exertion. Renal US  with no acute abnormalities.    PMH: anxiety, hx of SCC cancer hx of radiation, GERD, HTN, hx of seizures, syncopal episodes,  Clinical Impression  Patient evaluated by Physical Therapy with no further acute PT needs identified. All education has been completed and the patient has no further questions. Pt demonstrated bed mobility and transfers mod IND for increased time, toileted mod IND with PT on the other side of the door per pt request. Pt ambulated without device 521ft with SUP for safety. Pt completed dynamic gait index balance screen scoring 23/24 indicating pt is not at increased risk of falls at this time. Pt demonstrated safety with stair mobility to be able to ingress/egress her home. See below for any follow-up Physical Therapy or equipment needs. PT is signing off. Thank you for this referral.       PT Assessment    Assistance Needed at Discharge       Equipment Recommendations None recommended by PT  Recommendations for Other Services       Precautions/Restrictions Precautions Precautions: None Recall of Precautions/Restrictions: Intact Precaution/Restrictions Comments: Fall in the last six months: caught toe of my boot face down on the ground, six episodes Restrictions Weight Bearing Restrictions Per Provider Order: No        Mobility  Bed Mobility          Transfers Overall transfer level: Modified independent Equipment used: None                    Ambulation/Gait Ambulation/Gait assistance: Modified independent (Device/Increase time) Gait Distance (Feet): 500 Feet Assistive device: None Gait  Pattern/deviations: WFL(Within Functional Limits)      Home Activity Instructions    Stairs Stairs: Yes Stairs assistance: Contact guard assist Stair Management: No rails, Alternating pattern, Forwards Number of Stairs: 3    Modified Rankin (Stroke Patients Only)        Balance                   Dynamic Gait Index Level Surface: Normal Change in Gait Speed: Normal Gait with Horizontal Head Turns: Normal Gait with Vertical Head Turns: Normal Gait and Pivot Turn: Normal Step Over Obstacle: Normal Step Around Obstacles: Normal Steps: Mild Impairment Total Score: 23      Pertinent Vitals/Pain   Pain Assessment Pain Assessment: No/denies pain     Home Living                    Prior Function        UE/LE Assessment               Communication   Communication Communication: No apparent difficulties     Cognition         General Comments General comments (skin integrity, edema, etc.): Friend Becky present    Exercises     Assessment/Plan    PT Problem List         PT Visit Diagnosis Unsteadiness on feet (R26.81)    No Skilled PT     Co-evaluation                AMPAC 6 Clicks Help needed turning  from your back to your side while in a flat bed without using bedrails?: None Help needed moving from lying on your back to sitting on the side of a flat bed without using bedrails?: None Help needed moving to and from a bed to a chair (including a wheelchair)?: None Help needed standing up from a chair using your arms (e.g., wheelchair or bedside chair)?: None Help needed to walk in hospital room?: None Help needed climbing 3-5 steps with a railing? : A Little 6 Click Score: 23      End of Session Equipment Utilized During Treatment: Gait belt Activity Tolerance: Patient tolerated treatment well;No increased pain Patient left: in bed;with call bell/phone within reach;with family/visitor present Nurse Communication:  Mobility status PT Visit Diagnosis: Unsteadiness on feet (R26.81)     Time: 8784-8756 PT Time Calculation (min) (ACUTE ONLY): 28 min  Charges:   PT Evaluation $PT Eval Low Complexity: 1 Low PT Treatments $Gait Training: 8-22 mins    Elsie Grieves, PT, DPT [facility] Department Office: [phone]  Elsie Grieves  02/22/2024, 1:03 PM

## 2024-02-22 NOTE — Progress Notes (Signed)
  Echocardiogram 2D Echocardiogram has been performed.  Tinnie FORBES Gosling RDCS 02/22/2024, 9:51 AM

## 2024-02-22 NOTE — Progress Notes (Signed)
 [facility]                                                                              Marcia Harvey, is a 78 y.o. female, DOB - 1945/08/14, FMW:[id number] Admit date - 02/20/2024    Outpatient Primary MD for the patient is [name], MD  LOS - 2  days  No chief complaint on file.      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  Patient presented to [facility] on 10/9 with complaint of progressive fatigue and shortness of breath.  She had her thyroid  and most recent immunotherapy on 01/31/2024, after that she started having intermittent shortness of breath and had an outpatient CT scan of the chest without any acute findings.  Patient reported shortness of breath and palpitations with exertion, intermittent nausea although she has been eating and drinking.  She has been cutting back on the ibuprofen use, takes lisinopril -HCTZ for BP, last dose on the day of admission 02/20/2024. On 9/19 she had a creatinine of 1.06 and lab workup on admission showed creatinine of 3.43, potassium 5.4. Patient was admitted for further workup.  Assessment & Plan      AKI (acute kidney injury) -In the setting of NSAIDs, lisinopril , HCTZ use, immunotherapy agents, probable poor p.o. intake due to nausea.  No excessive vomiting or diarrhea. - No NSAIDs, lisinopril  HCTZ held.  UA negative for UTI -On prednisone , 40 mg twice daily per oncology due to possible concern for nephritis - Creatinine improving to 1.57 today, continue gentle hydration - Renal ultrasound normal, no obstructive uropathy  Hyperkalemia - Resolved  Intermittent shortness of breath with palpitations, exertional - Unclear etiology, BNP 13.0.  No hypoxia or tachycardia. - HRCT on 01/24/2024 had shown no acute intrapulmonary abnormality to suggest acute pneumonitis, several nonspecific bilateral scattered pulmonary micronodules. -  Continue steroids - Follow 2D echo    Squamous cell carcinoma of skin - Management per oncology, undergoing immunotherapy  GERD Continue PPI  Depression Continue sertraline   Hypertension -Currently BP stable, lisinopril , HCTZ held - If BP starts to rise, will place on low-dose Norvasc  Insomnia - Continue Restoril  Chronic back pain - Currently stable -Avoid NSAIDs, will place on tramadol  and Robaxin     Estimated body mass index is 30.2 kg/m (pended) as calculated from the following:   Height as of 01/02/24: (P) 5' 3 (1.6 m).   Weight as of 01/31/24: 77.3 kg.  Code Status: Full code DVT Prophylaxis:  heparin injection 5,000 Units Start: 02/20/24 1745   Level of Care: Level of care: Med-Surg Family Communication: Updated patient's family member at the bedside on 10/10, Disposition Plan:      Remains inpatient appropriate:   Pending echo, creatinine improving, likely DC home in a.m.   Procedures:    Consultants:   Oncology  Antimicrobials:   Anti-infectives (From admission, onward)    None          Medications  atorvastatin  40 mg Oral Daily   heparin  5,000 Units Subcutaneous Q8H  methocarbamol  500 mg Oral Q8H   sertraline  25 mg Oral Daily   temazepam  7.5 mg Oral QHS      Subjective:   Marcia Harvey was seen and examined today.  Feeling better, no acute complaints today.  No active shortness of breath, chest pain, nausea or vomiting.  States able to walk to the bathroom and back without any shortness of breath.  Objective:   Vitals:   02/21/24 0607 02/21/24 1309 02/21/24 2059 02/22/24 0653  BP: 123/64 125/79 (!) 154/82 135/77  Pulse: 70 74 82 70  Resp:  20 20 18   Temp: 97.9 F (36.6 C) 98.6 F (37 C) 98 F (36.7 C) 97.6 F (36.4 C)  TempSrc:  Oral    SpO2: 100% 95% 95% 98%    Intake/Output Summary (Last 24 hours) at 02/22/2024 1304 Last data filed at 02/22/2024 0903 Gross per 24 hour  Intake 259.89 ml  Output --  Net  259.89 ml     Wt Readings from Last 3 Encounters:  01/31/24 77.3 kg  01/24/24 77.5 kg  01/10/24 78.6 kg    Physical Exam General: Alert and oriented x 3, NAD Cardiovascular: S1 S2 clear, RRR.  Respiratory: CTAB, no wheezing Gastrointestinal: Soft, nontender, nondistended, NBS Ext: no pedal edema bilaterally Neuro: no new deficits Psych: Normal affect     Data Reviewed:  I have personally reviewed following labs    CBC Lab Results  Component Value Date   WBC 6.0 02/22/2024   RBC 3.98 02/22/2024   HGB 10.7 (L) 02/22/2024   HCT 34.3 (L) 02/22/2024   MCV 86.2 02/22/2024   MCH 26.9 02/22/2024   PLT 257 02/22/2024   MCHC 31.2 02/22/2024   RDW 13.2 02/22/2024   LYMPHSABS 0.8 02/20/2024   MONOABS 0.8 02/20/2024   EOSABS 0.0 02/20/2024   BASOSABS 0.0 02/20/2024     Last metabolic panel Lab Results  Component Value Date   NA 141 02/22/2024   K 4.1 02/22/2024   CL 110 02/22/2024   CO2 20 (L) 02/22/2024   BUN 40 (H) 02/22/2024   CREATININE 1.57 (H) 02/22/2024   GLUCOSE 104 (H) 02/22/2024   GFRNONAA 33 (L) 02/22/2024   GFRAA 43 (L) 06/07/2014   CALCIUM 9.2 02/22/2024   PHOS 2.1 (L) 02/22/2024   PROT 7.1 02/20/2024   ALBUMIN 3.6 02/22/2024   BILITOT 0.3 02/20/2024   ALKPHOS 109 02/20/2024   AST 36 02/20/2024   ALT 7 02/20/2024   ANIONGAP 11 02/22/2024    CBG (last 3)  No results for input(s): GLUCAP in the last 72 hours.    Coagulation Profile: No results for input(s): INR, PROTIME in the last 168 hours.   Radiology Studies: I have personally reviewed the imaging studies  US  RENAL Result Date: 02/21/2024 CLINICAL DATA:  Acute renal injury EXAM: RENAL / URINARY TRACT ULTRASOUND COMPLETE COMPARISON:  None Available. FINDINGS: Right Kidney: Renal measurements: 9.1 x 4.5 x 5.3 cm. = volume: 114 mL. Echogenicity within normal limits. No mass or hydronephrosis visualized. Left Kidney: Renal measurements: 8.9 x 5.1 x 4.2 cm. = volume: 99 mL. Echogenicity  within normal limits. No mass or hydronephrosis visualized. Bladder: Decompressed Other: None. IMPRESSION: No acute abnormality in the kidneys. Electronically Signed   By: Oneil Devonshire M.D.   On: 02/21/2024 21:42       Marcia Harvey M.D. [facility] 02/22/2024, 1:04 PM  Available via Epic secure chat 7am-7pm After 7 pm, please refer to night coverage provider listed on  amion.

## 2024-02-22 NOTE — Plan of Care (Signed)
  Problem: Education: Goal: Knowledge of General Education information will improve Description: Including pain rating scale, medication(s)/side effects and non-pharmacologic comfort measures Outcome: Progressing   Problem: Health Behavior/Discharge Planning: Goal: Ability to manage health-related needs will improve Outcome: Progressing   Problem: Clinical Measurements: Goal: Ability to maintain clinical measurements within normal limits will improve Outcome: Progressing Goal: Will remain free from infection Outcome: Progressing Goal: Diagnostic test results will improve Outcome: Progressing Goal: Respiratory complications will improve Outcome: Progressing Goal: Cardiovascular complication will be avoided Outcome: Progressing   Problem: Coping: Goal: Level of anxiety will decrease Outcome: Progressing   Problem: Elimination: Goal: Will not experience complications related to bowel motility Outcome: Progressing Goal: Will not experience complications related to urinary retention Outcome: Progressing   Problem: Pain Managment: Goal: General experience of comfort will improve and/or be controlled Outcome: Progressing   Problem: Safety: Goal: Ability to remain free from injury will improve Outcome: Progressing   Problem: Skin Integrity: Goal: Risk for impaired skin integrity will decrease Outcome: Progressing

## 2024-02-23 DIAGNOSIS — N179 Acute kidney failure, unspecified: Secondary | ICD-10-CM | POA: Diagnosis not present

## 2024-02-23 DIAGNOSIS — K2100 Gastro-esophageal reflux disease with esophagitis, without bleeding: Secondary | ICD-10-CM | POA: Diagnosis not present

## 2024-02-23 DIAGNOSIS — C4492 Squamous cell carcinoma of skin, unspecified: Secondary | ICD-10-CM | POA: Diagnosis not present

## 2024-02-23 DIAGNOSIS — R0600 Dyspnea, unspecified: Secondary | ICD-10-CM | POA: Diagnosis not present

## 2024-02-23 LAB — CBC
HCT: 35.2 % — ABNORMAL LOW (ref 36.0–46.0)
Hemoglobin: 10.9 g/dL — ABNORMAL LOW (ref 12.0–15.0)
MCH: 27.5 pg (ref 26.0–34.0)
MCHC: 31 g/dL (ref 30.0–36.0)
MCV: 88.7 fL (ref 80.0–100.0)
Platelets: 230 K/uL (ref 150–400)
RBC: 3.97 MIL/uL (ref 3.87–5.11)
RDW: 13.6 % (ref 11.5–15.5)
WBC: 4.2 K/uL (ref 4.0–10.5)
nRBC: 0 % (ref 0.0–0.2)

## 2024-02-23 LAB — RENAL FUNCTION PANEL
Albumin: 3.3 g/dL — ABNORMAL LOW (ref 3.5–5.0)
Anion gap: 12 (ref 5–15)
BUN: 30 mg/dL — ABNORMAL HIGH (ref 8–23)
CO2: 17 mmol/L — ABNORMAL LOW (ref 22–32)
Calcium: 8.8 mg/dL — ABNORMAL LOW (ref 8.9–10.3)
Chloride: 115 mmol/L — ABNORMAL HIGH (ref 98–111)
Creatinine, Ser: 1.32 mg/dL — ABNORMAL HIGH (ref 0.44–1.00)
GFR, Estimated: 41 mL/min — ABNORMAL LOW (ref >60)
Glucose, Bld: 78 mg/dL (ref 70–99)
Phosphorus: 2.8 mg/dL (ref 2.5–4.6)
Potassium: 4.3 mmol/L (ref 3.5–5.1)
Sodium: 143 mmol/L (ref 135–145)

## 2024-02-23 MED ORDER — METHOCARBAMOL 500 MG PO TABS: 500.0000 mg | ORAL_TABLET | Freq: Three times a day (TID) | ORAL | 0 refills | Status: DC | PRN

## 2024-02-23 MED ORDER — ONDANSETRON HCL 8 MG PO TABS: 8.0000 mg | ORAL_TABLET | Freq: Three times a day (TID) | ORAL | 1 refills | Status: DC | PRN

## 2024-02-23 MED ORDER — AMLODIPINE BESYLATE 2.5 MG PO TABS: 2.5000 mg | ORAL_TABLET | Freq: Every day | ORAL | 3 refills | Status: AC

## 2024-02-23 MED ORDER — AMLODIPINE BESYLATE 5 MG PO TABS
2.5000 mg | ORAL_TABLET | Freq: Every day | ORAL | Status: DC
  Administered 2024-02-23: 2.5 mg via ORAL
  Filled 2024-02-23: qty 1

## 2024-02-23 NOTE — Plan of Care (Signed)
  Problem: Education: Goal: Knowledge of General Education information will improve Description: Including pain rating scale, medication(s)/side effects and non-pharmacologic comfort measures Outcome: Adequate for Discharge   Problem: Health Behavior/Discharge Planning: Goal: Ability to manage health-related needs will improve Outcome: Adequate for Discharge   Problem: Clinical Measurements: Goal: Ability to maintain clinical measurements within normal limits will improve Outcome: Adequate for Discharge Goal: Will remain free from infection Outcome: Adequate for Discharge Goal: Diagnostic test results will improve Outcome: Adequate for Discharge Goal: Respiratory complications will improve Outcome: Adequate for Discharge Goal: Cardiovascular complication will be avoided Outcome: Adequate for Discharge   Problem: Coping: Goal: Level of anxiety will decrease Outcome: Adequate for Discharge   Problem: Elimination: Goal: Will not experience complications related to bowel motility Outcome: Adequate for Discharge Goal: Will not experience complications related to urinary retention Outcome: Adequate for Discharge   Problem: Pain Managment: Goal: General experience of comfort will improve and/or be controlled Outcome: Adequate for Discharge   Problem: Safety: Goal: Ability to remain free from injury will improve Outcome: Adequate for Discharge   Problem: Skin Integrity: Goal: Risk for impaired skin integrity will decrease Outcome: Adequate for Discharge

## 2024-02-23 NOTE — Discharge Summary (Signed)
 Physician Discharge Summary   Patient: Marcia Harvey MRN: [id number] DOB: Feb 18, 1946  Admit date:     02/20/2024  Discharge date: 02/23/24  Discharge Physician: [name], MD    PCP: [name], MD   Recommendations at discharge:   Lisinopril  HCTZ discontinued Norvasc 2.5 mg p.o. daily Patient will keep BP log readings and please adjust medications outpatient  Discharge Diagnoses:    AKI (acute kidney injury)   Squamous cell carcinoma of skin   GERD (gastroesophageal reflux disease)   Chronic back pain   Dyspnea   Hospital Course:  Patient is a 78 year old female with high risk of cutaneous squamous cell carcinoma on immunotherapy.  Patient presented to [facility] on 10/9 with complaint of progressive fatigue and shortness of breath.  She had her thyroid  and most recent immunotherapy on 01/31/2024, after that she started having intermittent shortness of breath and had an outpatient CT scan of the chest without any acute findings.  Patient reported shortness of breath and palpitations with exertion, intermittent nausea although she has been eating and drinking.  She has been cutting back on the ibuprofen use, takes lisinopril -HCTZ for BP, last dose on the day of admission 02/20/2024. On 9/19 she had a creatinine of 1.06 and lab workup on admission showed creatinine of 3.43, potassium 5.4. Patient was admitted for further workup.   Assessment and Plan:  AKI (acute kidney injury) -In the setting of NSAIDs, lisinopril , HCTZ use, immunotherapy agents, probable poor p.o. intake due to nausea.  No excessive vomiting or diarrhea.  Creatinine 3.43 on admission - No NSAIDs, lisinopril  HCTZ held.  UA negative for UTI -On prednisone , 40 mg twice daily per oncology due to possible concern for nephritis - Renal ultrasound normal, no obstructive uropathy -Patient was placed on gentle hydration, creatinine has improved to 1.3 at discharge. -Continue to hold lisinopril ,  HCTZ.   Hyperkalemia - Potassium 5.4 on admission,  - resolved, 4.3 on discharge.   Intermittent shortness of breath with palpitations, exertional - Unclear etiology, BNP 13.0.  No hypoxia or tachycardia. - HRCT on 01/24/2024 had shown no acute intrapulmonary abnormality to suggest acute pneumonitis, several nonspecific bilateral scattered pulmonary micronodules. - Patient was seen by Dr. Loretha, her oncologist, discontinued steroids - 2D echo showed EF of 65 to 70%, normal function, no regional wall motion abnormalities, no valve disease.     Squamous cell carcinoma of skin - Management per oncology, Dr Loretha undergoing immunotherapy   GERD Continue PPI   Depression Continue sertraline     Hypertension - BP stable -Lisinopril , HCTZ discontinued due to #1 - Placed on very low-dose Norvasc 2.5 mg daily.   Insomnia - Continue Restoril   Chronic back pain - Currently stable -Avoid NSAIDs, continue tramadol , Robaxin  Obesity class I Estimated body mass index is 30.2 kg/m (pended) as calculated from the following:   Height as of 01/02/24: (P) 5' 3 (1.6 m).   Weight as of 01/31/24: 77.3 kg.       Pain control - [place]  Controlled Substance Reporting System database was reviewed. and patient was instructed, not to drive, operate heavy machinery, perform activities at heights, swimming or participation in water activities or provide baby-sitting services while on Pain, Sleep and Anxiety Medications; until their outpatient Physician has advised to do so again. Also recommended to not to take more than prescribed Pain, Sleep and Anxiety Medications.  Consultants: Oncology Procedures performed: None Disposition: Home Diet recommendation:  Discharge Diet Orders (From admission, onward)  Start     Ordered   02/23/24 0000  Diet general        02/23/24 1201            DISCHARGE MEDICATION: Allergies as of 02/23/2024       Reactions   Bee Pollen Anaphylaxis,  Hives   Bee Venom Anaphylaxis, Hives   Codeine Hives   Iodinated Contrast Media Anaphylaxis, Hives, Other (See Comments)   Pt had reaction in 1981 to CT contrast media, pt had to be given epinephrine  and benadryl   Iodine Anaphylaxis, Hives, Other (See Comments)   Contrast dye   Iohexol Hives, Other (See Comments)   Treated for anaphylactic rxn. during  CT (1981)   Shellfish Protein-containing Drug Products Hives, Other (See Comments)   Shrimp   Tape Rash, Other (See Comments)   Irritating, red rash and Tape breaks out the skin- clear tape and Band-Aids = (skin redness, pain, and breakouts)   Wound Dressing Adhesive Rash, Other (See Comments)   Tape breaks out the skin- clear tape and Band-Aids = (skin redness, pain, and breakouts)        Medication List     STOP taking these medications    lisinopril -hydrochlorothiazide  10-12.5 MG tablet Commonly known as: ZESTORETIC    lisinopril -hydrochlorothiazide  20-25 MG tablet Commonly known as: ZESTORETIC        TAKE these medications    amLODipine 2.5 MG tablet Commonly known as: NORVASC Take 1 tablet (2.5 mg total) by mouth daily.   ascorbic acid 500 MG tablet Commonly known as: VITAMIN C Take 500 mg by mouth daily.   atorvastatin 40 MG tablet Commonly known as: LIPITOR Take 40 mg by mouth daily.   augmented betamethasone dipropionate 0.05 % cream Commonly known as: DIPROLENE-AF Apply 1 application  topically 2 (two) times daily as needed (for itching/irritation).   b complex vitamins tablet Take 1 tablet by mouth daily.   methocarbamol 500 MG tablet Commonly known as: ROBAXIN Take 1 tablet (500 mg total) by mouth every 8 (eight) hours as needed for muscle spasms.   omeprazole 40 MG capsule Commonly known as: PRILOSEC Take 40 mg by mouth daily before breakfast.   ondansetron  8 MG tablet Commonly known as: Zofran  Take 1 tablet (8 mg total) by mouth every 8 (eight) hours as needed for nausea or vomiting.    sertraline 25 MG tablet Commonly known as: ZOLOFT Take 25 mg by mouth See admin instructions. Take 25 mg by mouth in the morning and wean as tolerated   Systane Hydration PF 0.4-0.3 % Soln Generic drug: Polyethyl Glyc-Propyl Glyc PF Place 1 drop into both eyes See admin instructions. Instill 1 drop into both eyes four to six times a day   temazepam 7.5 MG capsule Commonly known as: RESTORIL Take 7.5 mg by mouth at bedtime as needed for sleep.   traMADol  50 MG tablet Commonly known as: ULTRAM  Take 1 tablet (50 mg total) by mouth every 12 (twelve) hours as needed. What changed: reasons to take this   Vitamin D3 50 MCG (2000 UT) Tabs Take 2,000 Units by mouth daily.        Follow-up Information     [name], MD. Schedule an appointment as soon as possible for a visit in 2 week(s).   Specialty: Family Medicine Why: for hospital follow-up Contact information: [address] [facility] [address] [phone]  Discharge Exam:  S: No acute complaints, feeling better, has been ambulating.  Cleared by PT.  Looking forward to go home today   BP (!) 149/87 (BP Location: Left Arm)   Pulse 78   Temp 98 F (36.7 C) (Oral)   Resp 20   SpO2 96%   Physical Exam General: Alert and oriented x 3, NAD Cardiovascular: S1 S2 clear, RRR.  Respiratory: CTAB, no wheezing, rales or rhonchi Gastrointestinal: Soft, nontender, nondistended, NBS Ext: no pedal edema bilaterally Neuro: no new deficits Psych: Normal affect     Condition at discharge: fair  The results of significant diagnostics from this hospitalization (including imaging, microbiology, ancillary and laboratory) are listed below for reference.   Imaging Studies: ECHOCARDIOGRAM COMPLETE Result Date: 02/22/2024    ECHOCARDIOGRAM REPORT   Patient Name:   ARIELYS WANDERSEE Date of Exam: 02/22/2024 Medical Rec #:  [id number]       Height:       63.0 in Accession  #:    [id number]      Weight:       170.5 lb Date of Birth:  1945/11/10       BSA:          1.807 m Patient Age:    78 years        BP:           135/77 mmHg Patient Gender: F               HR:           78 bpm. Exam Location:  Inpatient Procedure: 2D Echo, Color Doppler and Cardiac Doppler (Both Spectral and Color            Flow Doppler were utilized during procedure). Indications:    Dyspnea R06.00  History:        Patient has no prior history of Echocardiogram examinations.  Sonographer:    Tinnie Gosling RDCS Referring Phys: [phone] Daishon Chui K Marcella Charlson IMPRESSIONS  1. Left ventricular ejection fraction, by estimation, is 65 to 70%. The left ventricle has normal function. The left ventricle has no regional wall motion abnormalities. Left ventricular diastolic parameters were normal.  2. Right ventricular systolic function is normal. The right ventricular size is normal. Tricuspid regurgitation signal is inadequate for assessing PA pressure.  3. The mitral valve is normal in structure. No evidence of mitral valve regurgitation. No evidence of mitral stenosis.  4. The aortic valve is tricuspid. There is mild thickening of the aortic valve. Aortic valve regurgitation is not visualized. Aortic valve sclerosis is present, with no evidence of aortic valve stenosis.  5. The inferior vena cava is normal in size with greater than 50% respiratory variability, suggesting right atrial pressure of 3 mmHg. FINDINGS  Left Ventricle: Left ventricular ejection fraction, by estimation, is 65 to 70%. The left ventricle has normal function. The left ventricle has no regional wall motion abnormalities. The left ventricular internal cavity size was normal in size. There is  no left ventricular hypertrophy. Left ventricular diastolic parameters were normal. Right Ventricle: The right ventricular size is normal. No increase in right ventricular wall thickness. Right ventricular systolic function is normal. Tricuspid regurgitation signal is  inadequate for assessing PA pressure. Left Atrium: Left atrial size was normal in size. Right Atrium: Right atrial size was normal in size. Pericardium: There is no evidence of pericardial effusion. Mitral Valve: The mitral valve is normal in structure. No evidence of mitral valve regurgitation. No evidence of mitral  valve stenosis. Tricuspid Valve: The tricuspid valve is normal in structure. Tricuspid valve regurgitation is not demonstrated. No evidence of tricuspid stenosis. Aortic Valve: The aortic valve is tricuspid. There is mild thickening of the aortic valve. Aortic valve regurgitation is not visualized. Aortic valve sclerosis is present, with no evidence of aortic valve stenosis. Pulmonic Valve: The pulmonic valve was normal in structure. Pulmonic valve regurgitation is not visualized. No evidence of pulmonic stenosis. Aorta: The aortic root and ascending aorta are structurally normal, with no evidence of dilitation. Venous: The inferior vena cava is normal in size with greater than 50% respiratory variability, suggesting right atrial pressure of 3 mmHg. IAS/Shunts: No atrial level shunt detected by color flow Doppler.  LEFT VENTRICLE PLAX 2D LVIDd:         4.30 cm Diastology LVIDs:         2.10 cm LV e' medial:    7.62 cm/s LV PW:         1.10 cm LV E/e' medial:  12.5 LV IVS:        0.80 cm LV e' lateral:   7.72 cm/s                        LV E/e' lateral: 12.4  RIGHT VENTRICLE             IVC RV S prime:     12.80 cm/s  IVC diam: 1.20 cm LEFT ATRIUM             Index        RIGHT ATRIUM           Index LA diam:        3.50 cm 1.94 cm/m   RA Area:     10.30 cm LA Vol (A2C):   42.9 ml 23.74 ml/m  RA Volume:   21.60 ml  11.95 ml/m LA Vol (A4C):   28.2 ml 15.61 ml/m LA Biplane Vol: 36.6 ml 20.26 ml/m  AORTIC VALVE LVOT Vmax:   149.00 cm/s LVOT Vmean:  103.000 cm/s LVOT VTI:    0.298 m  AORTA Ao Asc diam: 3.50 cm MITRAL VALVE MV Area (PHT): 3.33 cm    SHUNTS MV Decel Time: 228 msec    Systemic VTI: 0.30  m MV E velocity: 95.50 cm/s MV A velocity: 95.50 cm/s MV E/A ratio:  1.00 Mihai Croitoru MD Electronically signed by Jerel Balding MD Signature Date/Time: 02/22/2024/2:23:16 PM    Final    US  RENAL Result Date: 02/21/2024 CLINICAL DATA:  Acute renal injury EXAM: RENAL / URINARY TRACT ULTRASOUND COMPLETE COMPARISON:  None Available. FINDINGS: Right Kidney: Renal measurements: 9.1 x 4.5 x 5.3 cm. = volume: 114 mL. Echogenicity within normal limits. No mass or hydronephrosis visualized. Left Kidney: Renal measurements: 8.9 x 5.1 x 4.2 cm. = volume: 99 mL. Echogenicity within normal limits. No mass or hydronephrosis visualized. Bladder: Decompressed Other: None. IMPRESSION: No acute abnormality in the kidneys. Electronically Signed   By: Oneil Devonshire M.D.   On: 02/21/2024 21:42   US  THYROID  Result Date: 02/14/2024 CLINICAL DATA:  Incidental on CT.  Thyroid  nodule EXAM: THYROID  ULTRASOUND TECHNIQUE: Ultrasound examination of the thyroid  gland and adjacent soft tissues was performed. COMPARISON:  CT chest, 02/01/2024. FINDINGS: Parenchymal Echotexture: Normal Isthmus: 0.1 cm Right lobe: 4.9 x 0.9 x 1.6 cm Left lobe: 5.1 x 1.4 x 1.2 cm _________________________________________________________ Estimated total number of nodules >/= 1 cm: 1 Number of spongiform nodules >/=  2 cm not described below (TR1): 0 Number of mixed cystic and solid nodules >/= 1.5 cm not described below (TR2): 0 _________________________________________________________ Nodule # 1: Location: LEFT; Inferior Maximum size: 2.8 cm; Other 2 dimensions: 2.2 x 1.7 cm Composition: solid/almost completely solid (2) Echogenicity: hypoechoic (2) Shape: not taller-than-wide (0) Margins: ill-defined (0) Echogenic foci: macrocalcifications (1) ACR TI-RADS total points: 5. ACR TI-RADS risk category: TR4 (4-6 points). ACR TI-RADS recommendations: **Given size (>/= 1.5 cm) and appearance, fine needle aspiration of this moderately suspicious nodule should be  considered based on TI-RADS criteria. _________________________________________________________ No cervical adenopathy or abnormal fluid collection within the imaged neck. IMPRESSION: Solitary, 2.8 cm LEFT inferior TR-4 thyroid  nodule. Fine needle aspiration of this moderately suspicious nodule should be considered based on TI-RADS criteria. The above is in keeping with the ACR TI-RADS recommendations - J Am Coll Radiol 2017;14:587-595. Electronically Signed   By: Thom Hall M.D.   On: 02/14/2024 11:30   CT CHEST HIGH RESOLUTION Result Date: 01/24/2024 CLINICAL DATA:  suspect cemiplimab  induced pneumonitis. EXAM: CT CHEST WITHOUT CONTRAST TECHNIQUE: Multidetector CT imaging of the chest was performed following the standard protocol without intravenous contrast. High resolution imaging of the lungs, as well as inspiratory and expiratory imaging, was performed. RADIATION DOSE REDUCTION: This exam was performed according to the departmental dose-optimization program which includes automated exposure control, adjustment of the mA and/or kV according to patient size and/or use of iterative reconstruction technique. COMPARISON:  PET CT 08/05/2023 FINDINGS: Cardiovascular: Normal heart size. No significant pericardial effusion. The thoracic aorta is normal in caliber. Mild-to-moderate atherosclerotic plaque of the thoracic aorta. Four-vessel coronary artery calcifications. Mediastinum/Nodes Slightly irregular with punctate calcification left thyroid  gland nodule poorly visualized. Interval right axillary lymph node dissection. Previously identified right axillary mass no longer identified. No gross hilar adenopathy, noting limited sensitivity for the detection of hilar adenopathy on this noncontrast study. No enlarged mediastinal or axillary lymph nodes. Trachea and esophagus demonstrate no significant findings. Small hiatal hernia. Lungs/Pleura: Trace biapical pleural/pulmonary scarring. No focal consolidation.  Several bilateral scattered pulmonary micronodules (4:67, 97, 115, 146, 159, 160, 218). Largest measures up to 4 mm within left lower lobe in the subpleural space. No pulmonary mass. No pleural effusion. No pneumothorax. Upper Abdomen: No acute abnormality. Musculoskeletal: No chest wall abnormality. No suspicious lytic or blastic osseous lesions. No acute displaced fracture. Multilevel degenerative changes of the spine. Severe degenerative changes of the T9-T10 level with loss of intervertebral disc space as well as endplate sclerosis. IMPRESSION: 1. No acute intrapulmonary abnormality to suggest pneumonitis. 2. Several nonspecific bilateral scattered pulmonary micronodules. Recommend attention on follow-up. 3. Left thyroid  slightly irregular with punctate calcification. Consider outpatient thyroid  US  (ref: J Am Coll Radiol. 2015 Feb;12(2): 143-50). 4. Small hiatal hernia. 5. Aortic Atherosclerosis (ICD10-I70.0) -including four-vessel coronary artery calcification. Electronically Signed   By: Morgane  Naveau M.D.   On: 01/24/2024 19:08    Microbiology: Results for orders placed or performed during the hospital encounter of 07/29/20  SARS CORONAVIRUS 2 (TAT 6-24 HRS) Nasopharyngeal Nasopharyngeal Swab     Status: None   Collection Time: 07/29/20  8:50 AM   Specimen: Nasopharyngeal Swab  Result Value Ref Range Status   SARS Coronavirus 2 NEGATIVE NEGATIVE Final    Comment: (NOTE) SARS-CoV-2 target nucleic acids are NOT DETECTED.  The SARS-CoV-2 RNA is generally detectable in upper and lower respiratory specimens during the acute phase of infection. Negative results do not preclude SARS-CoV-2 infection, do not rule out co-infections with other  pathogens, and should not be used as the sole basis for treatment or other patient management decisions. Negative results must be combined with clinical observations, patient history, and epidemiological information. The expected result is Negative.  Fact  Sheet for Patients: HairSlick.no  Fact Sheet for Healthcare Providers: quierodirigir.com  This test is not yet approved or cleared by the [place]  FDA and  has been authorized for detection and/or diagnosis of SARS-CoV-2 by FDA under an Emergency Use Authorization (EUA). This EUA will remain  in effect (meaning this test can be used) for the duration of the COVID-19 declaration under Se ction 564(b)(1) of the Act, 21 U.S.C. section 360bbb-3(b)(1), unless the authorization is terminated or revoked sooner.  Performed at [facility], [address] [place], [place] [address]     Labs: CBC: Recent Labs  Lab 02/20/24 1105 02/21/24 0539 02/22/24 0547 02/23/24 0638  WBC 8.7 5.0 6.0 4.2  NEUTROABS 7.0  --   --   --   HGB 11.6* 11.2* 10.7* 10.9*  HCT 34.8* 37.3 34.3* 35.2*  MCV 82.1 87.1 86.2 88.7  PLT 272 253 257 230   Basic Metabolic Panel: Recent Labs  Lab 02/20/24 1105 02/20/24 1646 02/21/24 0539 02/22/24 0547 02/23/24 0638  NA 137 140 140 141 143  K 5.4* 4.7 4.3 4.1 4.3  CL 103 106 108 110 115*  CO2 25 20* 21* 20* 17*  GLUCOSE 96 91 88 104* 78  BUN 64* 60* 49* 40* 30*  CREATININE 3.43* 2.83* 2.16* 1.57* 1.32*  CALCIUM 10.0 9.3 9.3 9.2 8.8*  MG 1.7  --   --   --   --   PHOS  --   --   --  2.1* 2.8   Liver Function Tests: Recent Labs  Lab 02/20/24 1105 02/20/24 1646 02/22/24 0547 02/23/24 0638  AST 24 36  --   --   ALT 7 7  --   --   ALKPHOS 98 109  --   --   BILITOT 0.4 0.3  --   --   PROT 7.7 7.1  --   --   ALBUMIN 3.8 3.7 3.6 3.3*   CBG: No results for input(s): GLUCAP in the last 168 hours.  Discharge time spent: greater than 30 minutes.  Signed: [name], MD [facility] 02/23/2024

## 2024-02-23 NOTE — Plan of Care (Signed)

## 2024-03-01 ENCOUNTER — Other Ambulatory Visit: Payer: Self-pay

## 2024-03-09 ENCOUNTER — Ambulatory Visit: Attending: Surgery | Admitting: Rehabilitation

## 2024-03-09 DIAGNOSIS — C773 Secondary and unspecified malignant neoplasm of axilla and upper limb lymph nodes: Secondary | ICD-10-CM | POA: Insufficient documentation

## 2024-03-09 DIAGNOSIS — Z483 Aftercare following surgery for neoplasm: Secondary | ICD-10-CM | POA: Insufficient documentation

## 2024-03-09 NOTE — Therapy (Signed)
 OUTPATIENT PHYSICAL THERAPY SOZO SCREENING NOTE   Patient Name: Marcia Harvey MRN: [id number] DOB:17-May-1945, 78 y.o., female Today's Date: 03/09/2024  PCP: [name], MD REFERRING PROVIDER: Vanderbilt Ned, MD   PT End of Session - 03/09/24 1009     Visit Number 6   screen only   PT Start Time 1005    PT Stop Time 1009    PT Time Calculation (min) 4 min    Activity Tolerance Patient tolerated treatment well          Past Medical History:  Diagnosis Date   Anxiety    Anxiety    Arthritis    Cancer (HCC) 07/2020   SCC chest   Depression    Fall from horse 01/27/2012   CT chest/Abd/Pelvis done at [facility]. Findings: Nondisplaced fx of posterior right 11th rib   GERD (gastroesophageal reflux disease)    History of radiation therapy    10/28/23-12/05/23- Dr. Lynwood Nasuti (Right Axilla)   Hypertension    Seizures (HCC)    been through testing, no issues with seizures or epilepsy   Syncopal episodes    seizure with episode   White matter disease    Past Surgical History:  Procedure Laterality Date   AXILLARY LYMPH NODE DISSECTION Right 09/04/2023   Procedure: REDGIE HARD;  Surgeon: Vanderbilt Ned, MD;  Location: [facility];  Service: General;  Laterality: Right;  GEN w/PEC BLOCK   BREAST LUMPECTOMY WITH RADIOACTIVE SEED LOCALIZATION Left 06/21/2020   Procedure: LEFT BREAST LUMPECTOMY WITH RADIOACTIVE SEED LOCALIZATION;  Surgeon: Belinda Cough, MD;  Location: [facility];  Service: General;  Laterality: Left;   BUNIONECTOMY Bilateral 1978   COLONOSCOPY     EYE SURGERY     MELANOMA EXCISION Right 08/02/2020   Procedure: 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;  Surgeon: Belinda Cough, MD;  Location: [facility];  Service: General;  Laterality: Right;  ROOM 8 STARTING AT 04:15PM FOR 45 MIN   SENTINEL NODE BIOPSY Right 09/04/2023   Procedure: BIOPSY, LYMPH NODE, SENTINEL;  Surgeon: Vanderbilt Ned, MD;  Location: [facility];  Service: General;  Laterality: Right;  SEED LOCALIZED RIGHT AXILLARY LYMPHNODE EXCIONSAL BIOPSY   Patient Active Problem List   Diagnosis Date Noted   GERD (gastroesophageal reflux disease) 02/21/2024   Chronic back pain 02/21/2024   Dyspnea 02/21/2024   AKI (acute kidney injury) 02/20/2024   Squamous cell carcinoma of skin 11/11/2023   Recurrent skin cancer 10/02/2023   Genetic testing 08/21/2023   Spondylosis without myelopathy or radiculopathy, 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 09/04/23 axillary node dissection with 6 negative nodes removed and 1 large cystic node - showed metastatic squamous cell carcinoma from a previous skin cancer on   PRECAUTIONS: right UE Lymphedema risk, None  SUBJECTIVE: nothing new  PAIN:  Are you having pain? No  SOZO SCREENING: Patient was assessed today using the SOZO machine to determine the lymphedema index score. This was compared to her baseline score. It was determined that she is within the recommended range when compared to her baseline and no further action is needed at this time. She  will continue SOZO screenings. These are done every 3 months for 2 years post operatively followed by every 6 months for 2 years, and then annually.  Saddie Raw, PT 03/09/24, 10:11 AM     Raw Saddie SAUNDERS, PT 03/09/2024, 10:10 AM

## 2024-03-10 ENCOUNTER — Other Ambulatory Visit: Payer: Self-pay

## 2024-03-11 ENCOUNTER — Other Ambulatory Visit: Payer: Self-pay

## 2024-03-11 ENCOUNTER — Telehealth: Payer: Self-pay

## 2024-03-11 DIAGNOSIS — C50611 Malignant neoplasm of axillary tail of right female breast: Secondary | ICD-10-CM

## 2024-03-11 NOTE — Telephone Encounter (Signed)
 Spoke with patient and confirmed appointment on 10/30

## 2024-03-12 ENCOUNTER — Inpatient Hospital Stay

## 2024-03-12 ENCOUNTER — Inpatient Hospital Stay: Admitting: Hematology and Oncology

## 2024-03-12 VITALS — BP 132/67 | HR 90 | Temp 98.2°F | Resp 18 | Wt 170.5 lb

## 2024-03-12 DIAGNOSIS — C4492 Squamous cell carcinoma of skin, unspecified: Secondary | ICD-10-CM | POA: Diagnosis present

## 2024-03-12 DIAGNOSIS — Z923 Personal history of irradiation: Secondary | ICD-10-CM | POA: Diagnosis not present

## 2024-03-12 DIAGNOSIS — E041 Nontoxic single thyroid nodule: Secondary | ICD-10-CM

## 2024-03-12 DIAGNOSIS — D649 Anemia, unspecified: Secondary | ICD-10-CM | POA: Diagnosis not present

## 2024-03-12 DIAGNOSIS — Z801 Family history of malignant neoplasm of trachea, bronchus and lung: Secondary | ICD-10-CM | POA: Diagnosis not present

## 2024-03-12 DIAGNOSIS — I10 Essential (primary) hypertension: Secondary | ICD-10-CM | POA: Diagnosis not present

## 2024-03-12 DIAGNOSIS — Z800 Family history of malignant neoplasm of digestive organs: Secondary | ICD-10-CM | POA: Diagnosis not present

## 2024-03-12 DIAGNOSIS — C50611 Malignant neoplasm of axillary tail of right female breast: Secondary | ICD-10-CM

## 2024-03-12 DIAGNOSIS — Z87891 Personal history of nicotine dependence: Secondary | ICD-10-CM | POA: Diagnosis not present

## 2024-03-12 DIAGNOSIS — Z853 Personal history of malignant neoplasm of breast: Secondary | ICD-10-CM | POA: Diagnosis not present

## 2024-03-12 LAB — CMP (CANCER CENTER ONLY)
ALT: 10 U/L (ref 0–44)
AST: 24 U/L (ref 15–41)
Albumin: 4.1 g/dL (ref 3.5–5.0)
Alkaline Phosphatase: 117 U/L (ref 38–126)
Anion gap: 8 (ref 5–15)
BUN: 32 mg/dL — ABNORMAL HIGH (ref 8–23)
CO2: 24 mmol/L (ref 22–32)
Calcium: 9.3 mg/dL (ref 8.9–10.3)
Chloride: 107 mmol/L (ref 98–111)
Creatinine: 1.1 mg/dL — ABNORMAL HIGH (ref 0.44–1.00)
GFR, Estimated: 51 mL/min — ABNORMAL LOW (ref >60)
Glucose, Bld: 108 mg/dL — ABNORMAL HIGH (ref 70–99)
Potassium: 4.4 mmol/L (ref 3.5–5.1)
Sodium: 139 mmol/L (ref 135–145)
Total Bilirubin: 0.3 mg/dL (ref 0.0–1.2)
Total Protein: 7.5 g/dL (ref 6.5–8.1)

## 2024-03-12 LAB — CBC WITH DIFFERENTIAL (CANCER CENTER ONLY)
Abs Immature Granulocytes: 0.09 K/uL — ABNORMAL HIGH (ref 0.00–0.07)
Basophils Absolute: 0 K/uL (ref 0.0–0.1)
Basophils Relative: 0 %
Eosinophils Absolute: 0.1 K/uL (ref 0.0–0.5)
Eosinophils Relative: 1 %
HCT: 36.3 % (ref 36.0–46.0)
Hemoglobin: 11.9 g/dL — ABNORMAL LOW (ref 12.0–15.0)
Immature Granulocytes: 1 %
Lymphocytes Relative: 17 %
Lymphs Abs: 1.3 K/uL (ref 0.7–4.0)
MCH: 27.5 pg (ref 26.0–34.0)
MCHC: 32.8 g/dL (ref 30.0–36.0)
MCV: 84 fL (ref 80.0–100.0)
Monocytes Absolute: 1 K/uL (ref 0.1–1.0)
Monocytes Relative: 13 %
Neutro Abs: 5.1 K/uL (ref 1.7–7.7)
Neutrophils Relative %: 68 %
Platelet Count: 307 K/uL (ref 150–400)
RBC: 4.32 MIL/uL (ref 3.87–5.11)
RDW: 14.8 % (ref 11.5–15.5)
WBC Count: 7.5 K/uL (ref 4.0–10.5)
nRBC: 0 % (ref 0.0–0.2)

## 2024-03-13 ENCOUNTER — Ambulatory Visit

## 2024-03-13 ENCOUNTER — Ambulatory Visit: Admitting: Adult Health

## 2024-03-13 ENCOUNTER — Ambulatory Visit: Admitting: Hematology and Oncology

## 2024-03-13 ENCOUNTER — Other Ambulatory Visit

## 2024-03-13 NOTE — Progress Notes (Signed)
 [facility] Cancer Follow up:    Marcia Lenis, MD [address] [place] [address]   DIAGNOSIS:  Cancer Staging  No matching staging information was found for the patient.    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, estrogen receptor negative (HCC)   07/31/2023 Cancer Staging   Staging form: Breast, AJCC 8th Edition - Clinical stage from 07/31/2023: Stage Unknown (cTX, cN1, cM0, ER-, PR-, HER2-) - Signed by Loretha Ash, MD on 07/31/2023 Stage prefix: Initial diagnosis Laterality: Right Staged by: Pathologist and managing physician Stage used in treatment planning: Yes National guidelines used in treatment planning: Yes Type of national guideline used in treatment planning: NCCN   08/12/2023 Genetic Testing   Single, 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  Report date is 08/12/2023.   The CancerNext-Expanded gene panel offered by [facility] and includes sequencing, rearrangement, and RNA analysis for the following 76 genes: AIP, ALK, APC, ATM, AXIN2, BAP1, BARD1, BMPR1A, BRCA1, BRCA2, BRIP1, CDC73, CDH1, CDK4, CDKN1B, CDKN2A, CEBPA, CHEK2, CTNNA1, DDX41, DICER1, ETV6, FH, FLCN, GATA2, LZTR1, MAX, MBD4, MEN1, MET, MLH1, MSH2, MSH3, MSH6, MUTYH, NF1, NF2, NTHL1, PALB2, PHOX2B, PMS2, POT1, PRKAR1A, PTCH1, PTEN, RAD51C, RAD51D, RB1,  RET, RUNX1, SDHA, SDHAF2, SDHB, SDHC, SDHD, SMAD4, SMARCA4, SMARCB1, SMARCE1, STK11, SUFU, TMEM127, TP53, TSC1, TSC2, VHL, and WT1 (sequencing and deletion/duplication); EGFR, HOXB13, KIT, MITF, PDGFRA, POLD1, and POLE (sequencing only); EPCAM and GREM1 (deletion/duplication only).    Squamous cell carcinoma of skin  10/28/2023 - 12/05/2023 Radiation Therapy   First Treatment Date: 2023-10-28 Last Treatment Date: 2023-12-05   Plan Name: Axilla_R Site: Axilla, Right Technique: 3D Mode: Photon Dose Per Fraction: 1.8 Gy Prescribed Dose (Delivered / Prescribed): 50.4 Gy / 50.4 Gy Prescribed Fxs (Delivered / Prescribed): 28 / 28   11/11/2023 Initial Diagnosis   Squamous cell carcinoma of skin   12/20/2023 - 01/31/2024 Chemotherapy   Patient is on Treatment Plan : ADVANCED CUTANEOUS SQUAMOUS CELL CARCINOMA Cemiplimab  q21d       CURRENT THERAPY: Cemiplimab   INTERVAL HISTORY: History of Present Illness  Marcia Harvey is a 78 year old female who presents for hospital follow up.  She experiences significant shortness of breath with minimal exertion, accompanied by tachycardia. There is a vague, intermittent achiness in the center of her chest that has improved over time. No chest pain is reported. Her shortness of breath is noticeable even during conversations, as noted by her Mah[employer] group.  She was recently hospitalized and received fluids, which significantly improved her condition. Her kidney function was monitored, and her BUN and creatinine levels were discussed with her. She actively manages her hydration using a counter clicker to track fluid intake, including drinks like Ensure, protein shakes, apple juice, and Body Armor. She is trying to increase her intake of plain water despite not being fond of it.  She has a history of anemia, with hemoglobin levels dropping from 13.6 to 11.9  over the past month. She experiences sleep disturbances, having difficulty falling asleep and staying  asleep despite using Restoril, which previously helped. She wakes up in the middle of the night and engages in activities like cleaning. She is currently on a 7.5 mg dose of Restoril.   Patient Active Problem List   Diagnosis Date Noted   GERD (gastroesophageal reflux disease) 02/21/2024   Chronic back pain 02/21/2024   Dyspnea 02/21/2024   AKI (acute kidney injury) 02/20/2024   Squamous cell carcinoma of skin 11/11/2023   Recurrent skin cancer 10/02/2023   Genetic testing 08/21/2023   Spondylosis without myelopathy or radiculopathy, cervical region 07/27/2019   Other intervertebral disc degeneration, lumbar region 07/27/2019   Fall from horse 01/27/2012    is allergic to bee pollen, bee venom, codeine, iodinated contrast media, iodine, iohexol, shellfish protein-containing drug products, tape, and wound dressing adhesive.  MEDICAL HISTORY: Past Medical History:  Diagnosis Date   Anxiety    Anxiety    Arthritis    Cancer (HCC) 07/2020   SCC chest   Depression    Fall from horse 01/27/2012   CT chest/Abd/Pelvis done at [facility]. Findings: Nondisplaced fx of posterior right 11th rib   GERD (gastroesophageal reflux disease)    History of radiation therapy    10/28/23-12/05/23- Dr. Lynwood Nasuti (Right Axilla)   Hypertension    Seizures (HCC)    been through testing, no issues with seizures or epilepsy   Syncopal episodes    seizure with episode   White matter disease     SURGICAL HISTORY: Past Surgical History:  Procedure Laterality Date   AXILLARY LYMPH NODE DISSECTION Right 09/04/2023   Procedure: REDGIE HARD;  Surgeon: Vanderbilt Ned, MD;  Location: [facility];  Service: General;  Laterality: Right;  GEN w/PEC BLOCK   BREAST LUMPECTOMY WITH RADIOACTIVE SEED LOCALIZATION Left 06/21/2020   Procedure: LEFT BREAST LUMPECTOMY WITH RADIOACTIVE SEED LOCALIZATION;  Surgeon: Belinda Cough, MD;  Location: [facility];  Service:  General;  Laterality: Left;   BUNIONECTOMY Bilateral 1978   COLONOSCOPY     EYE SURGERY     MELANOMA EXCISION Right 08/02/2020   Procedure: 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;  Surgeon: Belinda Cough, MD;  Location: [facility];  Service: General;  Laterality: Right;  ROOM 8 STARTING AT 04:15PM FOR 45 MIN   SENTINEL NODE BIOPSY Right 09/04/2023   Procedure: BIOPSY, LYMPH NODE, SENTINEL;  Surgeon: Vanderbilt Ned, MD;  Location: [facility];  Service: General;  Laterality: Right;  SEED LOCALIZED RIGHT AXILLARY LYMPHNODE EXCIONSAL BIOPSY    SOCIAL HISTORY: Social History   Socioeconomic History   Marital status: Single    Spouse name: Not on file   Number of children: Not on file   Years of education: Not on file   Highest education level: Not on file  Occupational History   Not on file  Tobacco Use   Smoking status: Former    Current packs/day: 0.00    Types: Cigarettes    Quit date: 05/21/2003    Years since quitting: 20.8   Smokeless tobacco: Never  Vaping Use   Vaping status: Never Used  Substance and Sexual Activity   Alcohol use: Not Currently    Comment: 3-4x per year   Drug use: No   Sexual activity: Not Currently    Birth control/protection: Post-menopausal  Other Topics Concern   Not on file  Social History Narrative   Are  you right handed or left handed? right   Are you currently employed ? no   What is your current occupation? Retired    Do you live at home alone? Alone    Who lives with you? NA    What type of home do you live in: 1 story or 2 story?  1 story        Social Drivers of Corporate Investment Banker Strain: Low Risk  (01/26/2024)   Received from [facility]   Overall Financial Resource Strain (CARDIA)    How hard is it for you to pay for the very basics like food, housing, medical care, and heating?: Not very hard  Food Insecurity: No Food Insecurity (02/20/2024)   Hunger Vital Sign    Worried  About Running Out of Food in the Last Year: Never true    Ran Out of Food in the Last Year: Never true  Transportation Needs: No Transportation Needs (02/20/2024)   PRAPARE - Administrator, Civil Service (Medical): No    Lack of Transportation (Non-Medical): No  Physical Activity: Sufficiently Active (01/26/2024)   Received from [facility]   Exercise Vital Sign    On average, how many days per week do you engage in moderate to strenuous exercise (like a brisk walk)?: 5 days    On average, how many minutes do you engage in exercise at this level?: 30 min  Stress: No Stress Concern Present (01/26/2024)   Received from [facility] - Occupational Stress Questionnaire    Do you feel stress - tense, restless, nervous, or anxious, or unable to sleep at night because your mind is troubled all the time - these days?: Only a little  Social Connections: Unknown (02/20/2024)   Social Connection and Isolation Panel    Frequency of Communication with Friends and Family: Not on file    Frequency of Social Gatherings with Friends and Family: Not on file    Attends Religious Services: Not on file    Active Member of Clubs or Organizations: Not on file    Attends Banker Meetings: Not on file    Marital Status: Never married  Intimate Partner Violence: Not At Risk (02/20/2024)   Humiliation, Afraid, Rape, and Kick questionnaire    Fear of Current or Ex-Partner: No    Emotionally Abused: No    Physically Abused: No    Sexually Abused: No    FAMILY HISTORY: Family History  Problem Relation Age of Onset   Colon cancer Mother 15       d. 14   Prostate cancer Father 46   Lung cancer Maternal Uncle        dx >50   Cancer Paternal Aunt        unknown type; mets; d. >50   Diabetes Maternal Grandfather    Diabetes Paternal Grandmother     Review of Systems  Constitutional:  Negative for appetite change, chills, fatigue, fever and  unexpected weight change.  HENT:   Negative for hearing loss, lump/mass and trouble swallowing.   Eyes:  Negative for eye problems and icterus.  Respiratory:  Negative for chest tightness, cough and shortness of breath.   Cardiovascular:  Negative for chest pain, leg swelling and palpitations.  Gastrointestinal:  Negative for abdominal distention, abdominal pain, constipation, diarrhea, nausea and vomiting.  Endocrine: Negative for hot flashes.  Genitourinary:  Negative for difficulty urinating.   Musculoskeletal:  Negative  for arthralgias.  Skin:  Negative for itching and rash.  Neurological:  Negative for dizziness, extremity weakness, headaches and numbness.  Hematological:  Negative for adenopathy. Does not bruise/bleed easily.  Psychiatric/Behavioral:  Negative for depression. The patient is not nervous/anxious.       PHYSICAL EXAMINATION      Vitals:   03/12/24 1359  BP: 132/67  Pulse: 90  Resp: 18  Temp: 98.2 F (36.8 C)  SpO2: 98%    Physical Exam Constitutional:      General: She is not in acute distress.    Appearance: Normal appearance. She is not toxic-appearing.  HENT:     Head: Normocephalic and atraumatic.     Mouth/Throat:     Mouth: Mucous membranes are moist.     Pharynx: Oropharynx is clear. No oropharyngeal exudate or posterior oropharyngeal erythema.  Eyes:     General: No scleral icterus. Cardiovascular:     Rate and Rhythm: Normal rate and regular rhythm.     Pulses: Normal pulses.     Heart sounds: Normal heart sounds.  Pulmonary:     Effort: Pulmonary effort is normal.     Breath sounds: Normal breath sounds.  Chest:     Comments: CTA bilaterally Abdominal:     General: Abdomen is flat. Bowel sounds are normal. There is no distension.     Palpations: Abdomen is soft.     Tenderness: There is no abdominal tenderness.  Musculoskeletal:        General: No swelling.     Cervical back: Neck supple.  Lymphadenopathy:     Cervical: No  cervical adenopathy.  Skin:    General: Skin is warm and dry.     Findings: No rash.  Neurological:     General: No focal deficit present.     Mental Status: She is alert.  Psychiatric:        Mood and Affect: Mood normal.        Behavior: Behavior normal.     LABORATORY DATA:  CBC    Component Value Date/Time   WBC 7.5 03/12/2024 1332   WBC 4.2 02/23/2024 0638   RBC 4.32 03/12/2024 1332   HGB 11.9 (L) 03/12/2024 1332   HCT 36.3 03/12/2024 1332   PLT 307 03/12/2024 1332   MCV 84.0 03/12/2024 1332   MCH 27.5 03/12/2024 1332   MCHC 32.8 03/12/2024 1332   RDW 14.8 03/12/2024 1332   LYMPHSABS 1.3 03/12/2024 1332   MONOABS 1.0 03/12/2024 1332   EOSABS 0.1 03/12/2024 1332   BASOSABS 0.0 03/12/2024 1332    CMP     Component Value Date/Time   NA 139 03/12/2024 1332   K 4.4 03/12/2024 1332   CL 107 03/12/2024 1332   CO2 24 03/12/2024 1332   GLUCOSE 108 (H) 03/12/2024 1332   BUN 32 (H) 03/12/2024 1332   CREATININE 1.10 (H) 03/12/2024 1332   CALCIUM 9.3 03/12/2024 1332   PROT 7.5 03/12/2024 1332   ALBUMIN 4.1 03/12/2024 1332   AST 24 03/12/2024 1332   ALT 10 03/12/2024 1332   ALKPHOS 117 03/12/2024 1332   BILITOT 0.3 03/12/2024 1332   GFRNONAA 51 (L) 03/12/2024 1332   GFRAA 43 (L) 06/07/2014 2230     ASSESSMENT and THERAPY PLAN:  Assessment and Plan Assessment & Plan  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 Immunotherapy discontinued due to adverse effects. Discussed potential recurrence risk - Discontinue further immunotherapy. - Review surveillance plan,  H and P every 2-3 months for a yr, then every 2-4 months for 1 yr, then every 4-6 months for 3 yrs, then every 6-12 months for life. - Continue dermatology follow-ups twice a year.  Shortness of breath Persistent with minimal exertion. Cardiac function adequate. Possible mild anemia and deconditioning contribution. -  Encourage gradual increase in physical activity. - Reassess in one month.  Mild anemia Hemoglobin decreased from 13.6 to 11.9, possibly contributing to shortness of breath. Improvement expected post-immunotherapy cessation. - Monitor hemoglobin levels. - Reassess in one month.  Left thyroid  nodule for evaluation Nodule requires further evaluation. Fine needle aspiration recommended. - Order fine needle aspiration. - Schedule procedure   Insomnia Difficulty sleeping despite Restoril. Current dose may be insufficient. Discussed alternative strategies. - Consider increasing Restoril dose. - Try magnesium glycine for relaxation.  All questions were answered. The patient knows to call the clinic with any problems, questions or concerns. We can certainly see the patient much sooner if necessary.  Total encounter time:30 minutes*in face-to-face visit time, chart review, lab review, care coordination, order entry, and documentation of the encounter time.  *Total Encounter Time as defined by the Centers for Medicare and Medicaid Services includes, in addition to the face-to-face time of a patient visit (documented in the note above) non-face-to-face time: obtaining and reviewing outside history, ordering and reviewing medications, tests or procedures, care coordination (communications with other health care professionals or caregivers) and documentation in the medical record.

## 2024-03-20 ENCOUNTER — Inpatient Hospital Stay

## 2024-03-20 ENCOUNTER — Inpatient Hospital Stay: Admitting: Adult Health

## 2024-04-03 ENCOUNTER — Ambulatory Visit: Admitting: Adult Health

## 2024-04-03 ENCOUNTER — Ambulatory Visit

## 2024-04-03 ENCOUNTER — Ambulatory Visit: Admitting: Hematology and Oncology

## 2024-04-03 ENCOUNTER — Other Ambulatory Visit

## 2024-04-06 ENCOUNTER — Emergency Department (HOSPITAL_COMMUNITY)

## 2024-04-06 ENCOUNTER — Other Ambulatory Visit: Payer: Self-pay

## 2024-04-06 ENCOUNTER — Telehealth: Payer: Self-pay

## 2024-04-06 ENCOUNTER — Emergency Department (HOSPITAL_COMMUNITY)
Admission: EM | Admit: 2024-04-06 | Discharge: 2024-04-06 | Disposition: A | Discharge status: Home / Self Care | Source: Ambulatory Visit | Attending: Emergency Medicine | Admitting: Emergency Medicine

## 2024-04-06 ENCOUNTER — Encounter (HOSPITAL_COMMUNITY): Payer: Self-pay

## 2024-04-06 DIAGNOSIS — R1031 Right lower quadrant pain: Secondary | ICD-10-CM | POA: Diagnosis present

## 2024-04-06 DIAGNOSIS — Z79899 Other long term (current) drug therapy: Secondary | ICD-10-CM | POA: Diagnosis not present

## 2024-04-06 DIAGNOSIS — I10 Essential (primary) hypertension: Secondary | ICD-10-CM | POA: Insufficient documentation

## 2024-04-06 DIAGNOSIS — Z87891 Personal history of nicotine dependence: Secondary | ICD-10-CM | POA: Diagnosis not present

## 2024-04-06 DIAGNOSIS — S32020A Wedge compression fracture of second lumbar vertebra, initial encounter for closed fracture: Secondary | ICD-10-CM

## 2024-04-06 DIAGNOSIS — Z85828 Personal history of other malignant neoplasm of skin: Secondary | ICD-10-CM | POA: Insufficient documentation

## 2024-04-06 DIAGNOSIS — K5792 Diverticulitis of intestine, part unspecified, without perforation or abscess without bleeding: Secondary | ICD-10-CM

## 2024-04-06 LAB — CBC WITH DIFFERENTIAL/PLATELET
Abs Immature Granulocytes: 0.01 K/uL (ref 0.00–0.07)
Basophils Absolute: 0 K/uL (ref 0.0–0.1)
Basophils Relative: 1 %
Eosinophils Absolute: 0.1 K/uL (ref 0.0–0.5)
Eosinophils Relative: 2 %
HCT: 38 % (ref 36.0–46.0)
Hemoglobin: 12.3 g/dL (ref 12.0–15.0)
Immature Granulocytes: 0 %
Lymphocytes Relative: 25 %
Lymphs Abs: 1.1 K/uL (ref 0.7–4.0)
MCH: 28 pg (ref 26.0–34.0)
MCHC: 32.4 g/dL (ref 30.0–36.0)
MCV: 86.4 fL (ref 80.0–100.0)
Monocytes Absolute: 0.6 K/uL (ref 0.1–1.0)
Monocytes Relative: 15 %
Neutro Abs: 2.5 K/uL (ref 1.7–7.7)
Neutrophils Relative %: 57 %
Platelets: 327 K/uL (ref 150–400)
RBC: 4.4 MIL/uL (ref 3.87–5.11)
RDW: 14.5 % (ref 11.5–15.5)
WBC: 4.3 K/uL (ref 4.0–10.5)
nRBC: 0 % (ref 0.0–0.2)

## 2024-04-06 LAB — COMPREHENSIVE METABOLIC PANEL WITH GFR
ALT: 12 U/L (ref 0–44)
AST: 49 U/L — ABNORMAL HIGH (ref 15–41)
Albumin: 3.8 g/dL (ref 3.5–5.0)
Alkaline Phosphatase: 128 U/L — ABNORMAL HIGH (ref 38–126)
Anion gap: 10 (ref 5–15)
BUN: 24 mg/dL — ABNORMAL HIGH (ref 8–23)
CO2: 19 mmol/L — ABNORMAL LOW (ref 22–32)
Calcium: 8.6 mg/dL — ABNORMAL LOW (ref 8.9–10.3)
Chloride: 115 mmol/L — ABNORMAL HIGH (ref 98–111)
Creatinine, Ser: 1.02 mg/dL — ABNORMAL HIGH (ref 0.44–1.00)
GFR, Estimated: 56 mL/min — ABNORMAL LOW (ref >60)
Glucose, Bld: 84 mg/dL (ref 70–99)
Potassium: 3.3 mmol/L — ABNORMAL LOW (ref 3.5–5.1)
Sodium: 144 mmol/L (ref 135–145)
Total Bilirubin: 0.3 mg/dL (ref 0.0–1.2)
Total Protein: 6.7 g/dL (ref 6.5–8.1)

## 2024-04-06 LAB — URINALYSIS, ROUTINE W REFLEX MICROSCOPIC
Bilirubin Urine: NEGATIVE
Glucose, UA: NEGATIVE mg/dL
Hgb urine dipstick: NEGATIVE
Ketones, ur: NEGATIVE mg/dL
Nitrite: NEGATIVE
Protein, ur: NEGATIVE mg/dL
Specific Gravity, Urine: 1.018 (ref 1.005–1.030)
pH: 5 (ref 5.0–8.0)

## 2024-04-06 LAB — LIPASE, BLOOD: Lipase: 46 U/L (ref 11–51)

## 2024-04-06 MED ORDER — OXYCODONE HCL 5 MG PO TABS
5.0000 mg | ORAL_TABLET | Freq: Once | ORAL | Status: AC
Start: 2024-04-06 — End: 2024-04-06
  Administered 2024-04-06: 5 mg via ORAL
  Filled 2024-04-06: qty 1

## 2024-04-06 MED ORDER — DICYCLOMINE HCL 20 MG PO TABS
20.0000 mg | ORAL_TABLET | Freq: Two times a day (BID) | ORAL | 0 refills | Status: AC
Start: 2024-04-06 — End: ?

## 2024-04-06 MED ORDER — ONDANSETRON 4 MG PO TBDP: 4.0000 mg | ORAL_TABLET | Freq: Three times a day (TID) | ORAL | 0 refills | Status: AC | PRN

## 2024-04-06 MED ORDER — AMOXICILLIN-POT CLAVULANATE 875-125 MG PO TABS
1.0000 | ORAL_TABLET | Freq: Two times a day (BID) | ORAL | 0 refills | Status: AC
Start: 2024-04-06 — End: ?

## 2024-04-06 MED ORDER — DEXAMETHASONE SOD PHOSPHATE PF 10 MG/ML IJ SOLN
10.0000 mg | Freq: Once | INTRAMUSCULAR | Status: AC
  Administered 2024-04-06: 10 mg via INTRAVENOUS

## 2024-04-06 MED ORDER — DICYCLOMINE HCL 10 MG/ML IM SOLN: 20.0000 mg | Freq: Once | INTRAMUSCULAR | Status: DC

## 2024-04-06 MED ORDER — ONDANSETRON HCL 4 MG/2ML IJ SOLN
4.0000 mg | Freq: Once | INTRAMUSCULAR | Status: AC
  Administered 2024-04-06: 4 mg via INTRAVENOUS
  Filled 2024-04-06: qty 2

## 2024-04-06 MED ORDER — MORPHINE SULFATE (PF) 2 MG/ML IV SOLN
2.0000 mg | Freq: Once | INTRAVENOUS | Status: AC
  Administered 2024-04-06: 2 mg via INTRAVENOUS
  Filled 2024-04-06: qty 1

## 2024-04-06 MED ORDER — POTASSIUM CHLORIDE CRYS ER 20 MEQ PO TBCR
20.0000 meq | EXTENDED_RELEASE_TABLET | Freq: Once | ORAL | Status: AC
  Administered 2024-04-06: 20 meq via ORAL
  Filled 2024-04-06: qty 1

## 2024-04-06 MED ORDER — AMOXICILLIN-POT CLAVULANATE 875-125 MG PO TABS
1.0000 | ORAL_TABLET | Freq: Once | ORAL | Status: AC
Start: 2024-04-06 — End: 2024-04-06
  Administered 2024-04-06: 1 via ORAL
  Filled 2024-04-06: qty 1

## 2024-04-06 MED ORDER — OXYCODONE HCL 5 MG PO TABS: 5.0000 mg | ORAL_TABLET | ORAL | 0 refills | Status: AC | PRN

## 2024-04-06 NOTE — Progress Notes (Signed)
 error

## 2024-04-06 NOTE — ED Notes (Signed)
 Cancer pt coming in POV due to onset of right low abd pain and back pain over the weekend. 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 She has an allergy to contrast dye and also has CKD. Due to this they have referred her to follow up in the ED for further test/admission.

## 2024-04-06 NOTE — ED Provider Notes (Signed)
  Physical Exam  BP (!) 175/94   Pulse 88   Temp 98.1 F (36.7 C)   Resp 20   SpO2 99%   Physical Exam  Procedures  Procedures  ED Course / MDM   Clinical Course as of 04/06/24 1717  Mon Apr 06, 2024  1621 CTAP pending at the time of signout to oncoming physician. [KH]    Clinical Course User Index [KH] Theophilus Pagan, MD   Medical Decision Making Amount and/or Complexity of Data Reviewed Labs: ordered. Radiology: ordered.  Risk Prescription drug management.   Received care of patient from Dr. Laurice and Dr. Theophilus.  Please see their note for prior history, physical and care.  Marcia Harvey is a 78 year old female who presents with concern for abdominal pain.  Labs completed and personally eval and interpreted by me show mild hypokalemia, no signs of pancreatitis, very mild AST elevation, similar Cr to prior.  CT shows subtle pericolonic stranding surrounding a sigmoid diverticulum, possible early acute diverticulitis, age-indeterminate compression deformity of L2, small hiatal hernia.  GB appears normal, no tenderness really in RUQ, and doubt occult cholelithiasis as etiology of symptoms.  Given rx for augmentin  for diverticulitis, pain and nausea control with strict return precautions.         Marcia Longs, MD 04/07/24 1220

## 2024-04-06 NOTE — Telephone Encounter (Signed)
 Placed call to pt this morning per message from [facility] after hours triage service stating pt called 11/23 c/o RLQ abd and RL back pain. She was advised to go to ED but did not go.  S/w pt and she reports this pain has been slowly creeping to her right abd for the past week but she thought it was gas. She states Friday the pain became acute and 10/10. She has taken Oxycodone  she had left over from surgery and she states this helps until the medication wears off, then pain returns at a 10/10.   D/t pt's contrast dye allergy and decreased kidney fxn, Dr Odean thinks it is best for pt to be seen in ED for further eval of this acute pain. Pt was advised of this and she is agreeable. Report called to [place], RN charge in [place].

## 2024-04-06 NOTE — ED Triage Notes (Signed)
 Pt c/o right low back and right low abdominal pain since Friday. Pt reports SOB on exertion/ heavy breathing at baseline since September. Pt reports Hx of skin cancer. Least cancer treatment was Sept. 19. Pt hypertensive on arrival.

## 2024-04-06 NOTE — Telephone Encounter (Signed)
 duplicate

## 2024-04-06 NOTE — ED Provider Notes (Signed)
 [place] EMERGENCY DEPARTMENT AT [facility] Provider Note  CSN: [id number] Arrival date & time: 04/06/24 1212  Chief Complaint(s) No chief complaint on file.  HPI Marcia Harvey is a 78 y.o. 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  Reports right-sided back pain radiating to her right lower quadrant x 4 days.  Reports she recently tried to take Tylenol  and tramadol  but this was not effective.  Was taking home oxycodone  with some pain relief but came to the ED given pain has persisted.  Denies dysuria, hematuria urinary frequency.  Normal BM.  Denies chest pain or shortness of breath.  Denies fever or decreased p.o.  Patient undergoing immunotherapy for high risk, cell carcinoma, last dose in 01/2024 and had reaction at that time.  Reports she has not taken any doses since then and those symptoms have abated.  Feels like presentation today is not related.  Past Medical History Past Medical History:  Diagnosis Date   Anxiety    Anxiety    Arthritis    Cancer (HCC) 07/2020   SCC chest   Depression    Fall from horse 01/27/2012   CT chest/Abd/Pelvis done at [facility]. Findings: Nondisplaced fx of posterior right 11th rib   GERD (gastroesophageal reflux disease)    History of radiation therapy    10/28/23-12/05/23- Dr. Lynwood Nasuti (Right Axilla)   Hypertension    Seizures (HCC)    been through testing, no issues with seizures or epilepsy   Syncopal episodes    seizure with episode   White matter disease    Patient Active Problem List   Diagnosis Date Noted   GERD (gastroesophageal reflux disease) 02/21/2024   Chronic back pain 02/21/2024   Dyspnea 02/21/2024   AKI (acute kidney injury) 02/20/2024   Squamous cell carcinoma of skin 11/11/2023   Recurrent skin cancer 10/02/2023    Genetic testing 08/21/2023   Spondylosis without myelopathy or radiculopathy, cervical region 07/27/2019   Other intervertebral disc degeneration, lumbar region 07/27/2019   Fall from horse 01/27/2012   Home Medication(s) Prior to Admission medications   Medication Sig Start Date End Date Taking? Authorizing Provider  amLODipine  (NORVASC ) 2.5 MG tablet Take 1 tablet (2.5 mg total) by mouth daily. 02/23/24   Rai, Nydia POUR, MD  ascorbic acid (VITAMIN C) 500 MG tablet Take 500 mg by mouth daily.    [provider]  atorvastatin  (LIPITOR) 40 MG tablet Take 40 mg by mouth daily.    [provider]  augmented betamethasone dipropionate (DIPROLENE-AF) 0.05 % cream Apply 1 application  topically 2 (two) times daily as needed (for itching/irritation).    [provider]  b complex vitamins tablet Take 1 tablet by mouth daily.    [provider]  Cholecalciferol (VITAMIN D3) 2000 UNITS TABS Take 2,000 Units by mouth daily.    [provider]  methocarbamol  (ROBAXIN ) 500 MG tablet Take 1 tablet (500 mg total) by mouth every 8 (eight) hours as needed for muscle spasms. 02/23/24   Rai, Nydia POUR, MD  omeprazole (PRILOSEC) 40 MG capsule Take 40 mg by mouth daily before breakfast. 10/12/16   [provider]  sertraline  (ZOLOFT ) 25 MG tablet Take 25 mg by mouth See admin instructions. Take 25 mg by mouth in the morning and wean as tolerated 06/22/16   [provider]  SYSTANE HYDRATION PF 0.4-0.3 % SOLN Place 1 drop into both eyes See admin instructions. Instill 1 drop into both eyes four to six times a day    [provider]  temazepam  (RESTORIL ) 7.5 MG capsule Take 7.5 mg by mouth at bedtime as needed for sleep.    [provider]  traMADol  (ULTRAM ) 50 MG tablet Take 1 tablet (50 mg total) by mouth every 12 (twelve) hours as needed. Patient taking differently: Take 50 mg by mouth every 12 (twelve) hours as needed (for pain).  01/14/24   Loretha Ash, MD                                                                                                                                    Past Surgical History Past Surgical History:  Procedure Laterality Date   AXILLARY LYMPH NODE DISSECTION Right 09/04/2023   Procedure: REDGIE HARD;  Surgeon: Vanderbilt Ned, MD;  Location: [facility];  Service: General;  Laterality: Right;  GEN w/PEC BLOCK   BREAST LUMPECTOMY WITH RADIOACTIVE SEED LOCALIZATION Left 06/21/2020   Procedure: LEFT BREAST LUMPECTOMY WITH RADIOACTIVE SEED LOCALIZATION;  Surgeon: Belinda Cough, MD;  Location: [facility];  Service: General;  Laterality: Left;   BUNIONECTOMY Bilateral 1978   COLONOSCOPY     EYE SURGERY     MELANOMA EXCISION Right 08/02/2020   Procedure: 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;  Surgeon: Belinda Cough, MD;  Location: [facility];  Service: General;  Laterality: Right;  ROOM 8 STARTING AT 04:15PM FOR 45 MIN   SENTINEL NODE BIOPSY Right 09/04/2023   Procedure: BIOPSY, LYMPH NODE, SENTINEL;  Surgeon: Vanderbilt Ned, MD;  Location:  SURGERY CENTER;  Service: General;  Laterality: Right;  SEED LOCALIZED RIGHT AXILLARY LYMPHNODE EXCIONSAL BIOPSY   Family History Family History  Problem Relation Age of Onset   Colon cancer Mother 73       d. 85   Prostate cancer Father 8   Lung cancer Maternal Uncle        dx >50   Cancer Paternal Aunt        unknown type; mets; d. >50   Diabetes Maternal Grandfather    Diabetes Paternal Grandmother     Social History Social History   Tobacco Use   Smoking status: Former    Current packs/day: 0.00    Types: Cigarettes    Quit date: 05/21/2003    Years since quitting: 20.8   Smokeless tobacco: Never  Vaping Use   Vaping status: Never Used  Substance Use Topics   Alcohol use: Not Currently    Comment: 3-4x per year   Drug use: No   Allergies Bee  pollen, Bee venom, Codeine, Iodinated contrast media, Iodine, Iohexol, Shellfish protein-containing drug products, Tape, and Wound dressing adhesive  Review of Systems A thorough review of systems was obtained and all  systems are negative except as noted in the HPI and PMH.   Physical Exam Vital Signs  I have reviewed the triage vital signs BP (!) 175/94   Pulse 88   Temp 98.1 F (36.7 C)   Resp 20   SpO2 99%  Physical Exam General: Well-appearing. Alert. NAD HEENT: Normocephalic. White sclera. No rhinorrhea or congestion CV: RRR without murmur Pulm: CTAB. Normal WOB on RA. No wheezing Abdomen: Soft, non-distended. +BS.  TTP over RLQ radiating to right mid back.  + R CVA tenderness. Ext: Well perfused. Cap refill < 3 seconds Skin: Warm, dry. No rashes noted  ED Results and Treatments Labs (all labs ordered are listed, but only abnormal results are displayed) Labs Reviewed  COMPREHENSIVE METABOLIC PANEL WITH GFR - Abnormal; Notable for the following components:      Result Value   Potassium 3.3 (*)    Chloride 115 (*)    CO2 19 (*)    BUN 24 (*)    Creatinine, Ser 1.02 (*)    Calcium  8.6 (*)    AST 49 (*)    Alkaline Phosphatase 128 (*)    GFR, Estimated 56 (*)    All other components within normal limits  URINALYSIS, ROUTINE W REFLEX MICROSCOPIC - Abnormal; Notable for the following components:   Color, Urine AMBER (*)    APPearance HAZY (*)    Leukocytes,Ua TRACE (*)    Bacteria, UA RARE (*)    All other components within normal limits  LIPASE, BLOOD  CBC WITH DIFFERENTIAL/PLATELET                                                                                                                          Radiology No results found.  Pertinent labs & imaging results that were available during my care of the patient were reviewed by me and considered in my medical decision making (see MDM for details).  Medications Ordered in ED Medications  morphine  (PF) 2 MG/ML  injection 2 mg (2 mg Intravenous Given 04/06/24 1357)                                                                   Medical Decision Making / ED Course    Medical Decision Making:    ALONA DANFORD is a 78 y.o. 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 The complaint involves an extensive differential diagnosis and also carries with it a high risk of complications and morbidity.  Serious etiology was considered. Ddx includes but is not limited to: Nephrolithiasis, pyelonephritis, diverticulitis cholecystitis, constipation, MSK  strain  Complete initial physical exam performed, notably the patient was in no distress.    Reviewed and confirmed nursing documentation for past medical history, family history, social history.  Vital signs reviewed.    78 year old female with history as above presenting with right-sided back and abdominal pain x 4 days.  Given pattern of radiation concerning for nephrolithiasis, low concern for pyelonephritis or infectious etiology given lack of systemic symptoms.  Unlikely related to immunotherapy given discontinuation in 01/2024 and improvement in other reaction symptoms.  Lab work and urine studies overall reassuring without acute etiology.  Will obtain CTAP to further elucidate intra-abdominal etiology.   Clinical Course as of 04/06/24 1621  Mon Apr 06, 2024  1621 CTAP pending at the time of signout to oncoming physician. [KH]    Clinical Course User Index [KH] Theophilus Pagan, MD      Lab Tests: -I ordered, reviewed, and interpreted labs.   The pertinent results include:   Labs Reviewed  COMPREHENSIVE METABOLIC PANEL WITH GFR - Abnormal; Notable for the following components:      Result Value   Potassium 3.3 (*)    Chloride 115 (*)    CO2 19 (*)    BUN 24 (*)     Creatinine, Ser 1.02 (*)    Calcium  8.6 (*)    AST 49 (*)    Alkaline Phosphatase 128 (*)    GFR, Estimated 56 (*)    All other components within normal limits  URINALYSIS, ROUTINE W REFLEX MICROSCOPIC - Abnormal; Notable for the following components:   Color, Urine AMBER (*)    APPearance HAZY (*)    Leukocytes,Ua TRACE (*)    Bacteria, UA RARE (*)    All other components within normal limits  LIPASE, BLOOD  CBC WITH DIFFERENTIAL/PLATELET     EKG   EKG Interpretation Date/Time:  Monday April 06 2024 13:45:09 EST Ventricular Rate:  82 PR Interval:  170 QRS Duration:  88 QT Interval:  366 QTC Calculation: 428 R Axis:   38  Text Interpretation: Sinus rhythm Anteroseptal infarct, age indeterminate Confirmed by Marcia Coy [phone]) on 04/06/2024 2:59:40 PM         Imaging Studies ordered: I ordered imaging studies including CTAP wo I independently visualized the following imaging with scope of interpretation limited to determining acute life threatening conditions related to emergency care; findings noted above I agree with the radiologist interpretation If any imaging was obtained with contrast I closely monitored patient for any possible adverse reaction a/w contrast administration in the emergency department   Medicines ordered and prescription drug management: Meds ordered this encounter  Medications   morphine  (PF) 2 MG/ML injection 2 mg    -I have reviewed the patients home medicines and have made adjustments as needed   Cardiac Monitoring: The patient was maintained on a cardiac monitor.  I personally viewed and interpreted the cardiac monitored which showed an underlying rhythm of: NSR Continuous pulse oximetry interpreted by myself, 99% on RA.   Reevaluation: After the interventions noted above, I reevaluated the patient and found that they have improved  Co morbidities that complicate the patient evaluation  Past Medical History:  Diagnosis Date    Anxiety    Anxiety    Arthritis    Cancer (HCC) 07/2020   SCC chest   Depression    Fall from horse 01/27/2012   CT chest/Abd/Pelvis done at [facility]. Findings: Nondisplaced fx of posterior right 11th rib   GERD (gastroesophageal reflux  disease)    History of radiation therapy    10/28/23-12/05/23- Dr. Lynwood Nasuti (Right Axilla)   Hypertension    Seizures (HCC)    been through testing, no issues with seizures or epilepsy   Syncopal episodes    seizure with episode   White matter disease       Dispostion: Disposition decision including need for hospitalization was considered, and patient discharged from emergency department.    Final Clinical Impression(s) / ED Diagnoses Final diagnoses:  None        Theophilus Pagan, MD 04/06/24 1621    Marcia Maude BROCKS, MD 04/08/24 1154

## 2024-04-07 ENCOUNTER — Other Ambulatory Visit: Payer: Self-pay

## 2024-04-07 DIAGNOSIS — C50611 Malignant neoplasm of axillary tail of right female breast: Secondary | ICD-10-CM

## 2024-04-10 ENCOUNTER — Inpatient Hospital Stay

## 2024-04-10 ENCOUNTER — Inpatient Hospital Stay: Attending: Hematology and Oncology

## 2024-04-10 ENCOUNTER — Inpatient Hospital Stay: Admitting: Hematology and Oncology

## 2024-04-10 ENCOUNTER — Emergency Department (HOSPITAL_COMMUNITY)

## 2024-04-10 ENCOUNTER — Emergency Department (HOSPITAL_COMMUNITY)
Admission: EM | Admit: 2024-04-10 | Discharge: 2024-04-10 | Disposition: A | Discharge status: Home / Self Care | Source: Ambulatory Visit | Attending: Emergency Medicine | Admitting: Emergency Medicine

## 2024-04-10 ENCOUNTER — Encounter (HOSPITAL_COMMUNITY): Payer: Self-pay | Admitting: Emergency Medicine

## 2024-04-10 VITALS — BP 160/90 | HR 80 | Temp 98.8°F | Resp 18 | Ht 63.0 in | Wt 168.8 lb

## 2024-04-10 DIAGNOSIS — R1031 Right lower quadrant pain: Secondary | ICD-10-CM

## 2024-04-10 DIAGNOSIS — Z79899 Other long term (current) drug therapy: Secondary | ICD-10-CM | POA: Insufficient documentation

## 2024-04-10 DIAGNOSIS — Z853 Personal history of malignant neoplasm of breast: Secondary | ICD-10-CM | POA: Insufficient documentation

## 2024-04-10 DIAGNOSIS — R10A1 Flank pain, right side: Secondary | ICD-10-CM

## 2024-04-10 DIAGNOSIS — I10 Essential (primary) hypertension: Secondary | ICD-10-CM | POA: Insufficient documentation

## 2024-04-10 DIAGNOSIS — Z85828 Personal history of other malignant neoplasm of skin: Secondary | ICD-10-CM | POA: Insufficient documentation

## 2024-04-10 DIAGNOSIS — X58XXXA Exposure to other specified factors, initial encounter: Secondary | ICD-10-CM | POA: Insufficient documentation

## 2024-04-10 DIAGNOSIS — C50611 Malignant neoplasm of axillary tail of right female breast: Secondary | ICD-10-CM

## 2024-04-10 DIAGNOSIS — S32028A Other fracture of second lumbar vertebra, initial encounter for closed fracture: Secondary | ICD-10-CM | POA: Insufficient documentation

## 2024-04-10 DIAGNOSIS — M48061 Spinal stenosis, lumbar region without neurogenic claudication: Secondary | ICD-10-CM | POA: Insufficient documentation

## 2024-04-10 DIAGNOSIS — S32020A Wedge compression fracture of second lumbar vertebra, initial encounter for closed fracture: Secondary | ICD-10-CM

## 2024-04-10 LAB — CBC WITH DIFFERENTIAL (CANCER CENTER ONLY)
Abs Immature Granulocytes: 0.02 K/uL (ref 0.00–0.07)
Basophils Absolute: 0 K/uL (ref 0.0–0.1)
Basophils Relative: 0 %
Eosinophils Absolute: 0.1 K/uL (ref 0.0–0.5)
Eosinophils Relative: 2 %
HCT: 37.9 % (ref 36.0–46.0)
Hemoglobin: 12.5 g/dL (ref 12.0–15.0)
Immature Granulocytes: 0 %
Lymphocytes Relative: 26 %
Lymphs Abs: 1.2 K/uL (ref 0.7–4.0)
MCH: 28 pg (ref 26.0–34.0)
MCHC: 33 g/dL (ref 30.0–36.0)
MCV: 84.8 fL (ref 80.0–100.0)
Monocytes Absolute: 0.7 K/uL (ref 0.1–1.0)
Monocytes Relative: 15 %
Neutro Abs: 2.6 K/uL (ref 1.7–7.7)
Neutrophils Relative %: 57 %
Platelet Count: 308 K/uL (ref 150–400)
RBC: 4.47 MIL/uL (ref 3.87–5.11)
RDW: 14.6 % (ref 11.5–15.5)
WBC Count: 4.6 K/uL (ref 4.0–10.5)
nRBC: 0 % (ref 0.0–0.2)

## 2024-04-10 LAB — CMP (CANCER CENTER ONLY)
ALT: 22 U/L (ref 0–44)
AST: 56 U/L — ABNORMAL HIGH (ref 15–41)
Albumin: 4.4 g/dL (ref 3.5–5.0)
Alkaline Phosphatase: 161 U/L — ABNORMAL HIGH (ref 38–126)
Anion gap: 12 (ref 5–15)
BUN: 26 mg/dL — ABNORMAL HIGH (ref 8–23)
CO2: 21 mmol/L — ABNORMAL LOW (ref 22–32)
Calcium: 9.6 mg/dL (ref 8.9–10.3)
Chloride: 111 mmol/L (ref 98–111)
Creatinine: 1.13 mg/dL — ABNORMAL HIGH (ref 0.44–1.00)
GFR, Estimated: 49 mL/min — ABNORMAL LOW (ref >60)
Glucose, Bld: 89 mg/dL (ref 70–99)
Potassium: 4.5 mmol/L (ref 3.5–5.1)
Sodium: 144 mmol/L (ref 135–145)
Total Bilirubin: 0.3 mg/dL (ref 0.0–1.2)
Total Protein: 8 g/dL (ref 6.5–8.1)

## 2024-04-10 LAB — T4, FREE: Free T4: 0.93 ng/dL (ref 0.61–1.12)

## 2024-04-10 LAB — TSH: TSH: 1.62 u[IU]/mL (ref 0.350–4.500)

## 2024-04-10 MED ORDER — GADOBUTROL 1 MMOL/ML IV SOLN
7.5000 mL | Freq: Once | INTRAVENOUS | Status: AC | PRN
  Administered 2024-04-10: 7.5 mL via INTRAVENOUS

## 2024-04-10 MED ORDER — GABAPENTIN 300 MG PO CAPS: 300.0000 mg | ORAL_CAPSULE | Freq: Every day | ORAL | 0 refills | Status: AC

## 2024-04-10 MED ORDER — HYDROMORPHONE HCL 1 MG/ML IJ SOLN
1.0000 mg | Freq: Once | INTRAMUSCULAR | Status: AC
  Administered 2024-04-10: 1 mg via INTRAVENOUS
  Filled 2024-04-10: qty 1

## 2024-04-10 MED ORDER — PREDNISONE 10 MG (21) PO TBPK: ORAL_TABLET | Freq: Every day | ORAL | 0 refills | Status: AC

## 2024-04-10 MED ORDER — ONDANSETRON HCL 4 MG/2ML IJ SOLN
4.0000 mg | Freq: Once | INTRAMUSCULAR | Status: AC
  Administered 2024-04-10: 4 mg via INTRAVENOUS
  Filled 2024-04-10: qty 2

## 2024-04-10 MED ORDER — DEXAMETHASONE SOD PHOSPHATE PF 10 MG/ML IJ SOLN
10.0000 mg | Freq: Once | INTRAMUSCULAR | Status: AC
  Administered 2024-04-10: 10 mg via INTRAVENOUS

## 2024-04-10 MED ORDER — METHOCARBAMOL 500 MG PO TABS: 500.0000 mg | ORAL_TABLET | Freq: Three times a day (TID) | ORAL | 0 refills | Status: AC | PRN

## 2024-04-10 NOTE — Progress Notes (Signed)
 [facility] Cancer Follow up:    [name], MD [address] [place] [address]   DIAGNOSIS:  Cancer Staging  No matching staging information was found for the patient.    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, estrogen receptor negative (HCC)   07/31/2023 Cancer Staging   Staging form: Breast, AJCC 8th Edition - Clinical stage from 07/31/2023: Stage Unknown (cTX, cN1, cM0, ER-, PR-, HER2-) - Signed by Loretha Ash, MD on 07/31/2023 Stage prefix: Initial diagnosis Laterality: Right Staged by: Pathologist and managing physician Stage used in treatment planning: Yes National guidelines used in treatment planning: Yes Type of national guideline used in treatment planning: NCCN   08/12/2023 Genetic Testing   Single, 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  Report date is 08/12/2023.   The CancerNext-Expanded gene panel offered by [facility] and includes sequencing, rearrangement, and RNA analysis for the following 76 genes: AIP, ALK, APC, ATM, AXIN2, BAP1, BARD1, BMPR1A, BRCA1, BRCA2, BRIP1, CDC73, CDH1, CDK4, CDKN1B, CDKN2A, CEBPA, CHEK2, CTNNA1, DDX41, DICER1, ETV6, FH, FLCN, GATA2, LZTR1, MAX, MBD4, MEN1, MET, MLH1, MSH2, MSH3, MSH6, MUTYH, NF1, NF2, NTHL1, PALB2, PHOX2B, PMS2, POT1, PRKAR1A, PTCH1, PTEN, RAD51C, RAD51D, RB1,  RET, RUNX1, SDHA, SDHAF2, SDHB, SDHC, SDHD, SMAD4, SMARCA4, SMARCB1, SMARCE1, STK11, SUFU, TMEM127, TP53, TSC1, TSC2, VHL, and WT1 (sequencing and deletion/duplication); EGFR, HOXB13, KIT, MITF, PDGFRA, POLD1, and POLE (sequencing only); EPCAM and GREM1 (deletion/duplication only).    Squamous cell carcinoma of skin  10/28/2023 - 12/05/2023 Radiation Therapy   First Treatment Date: 2023-10-28 Last Treatment Date: 2023-12-05   Plan Name: Axilla_R Site: Axilla, Right Technique: 3D Mode: Photon Dose Per Fraction: 1.8 Gy Prescribed Dose (Delivered / Prescribed): 50.4 Gy / 50.4 Gy Prescribed Fxs (Delivered / Prescribed): 28 / 28   11/11/2023 Initial Diagnosis   Squamous cell carcinoma of skin   12/20/2023 - 01/31/2024 Chemotherapy   Patient is on Treatment Plan : ADVANCED CUTANEOUS SQUAMOUS CELL CARCINOMA Cemiplimab  q21d       CURRENT THERAPY: obersvation  INTERVAL HISTORY: History of Present Illness  Marcia Harvey is a 78 year old female who presents with severe abdominal and back pain.  She experiences severe pain in the lower abdomen on the right side and lower back on the right side, with equal intensity in both areas. The pain may radiate from the back to the front, but the exact pattern is uncertain.  A CT scan was performed, but the findings did not correlate with her current pain location. She notes that the pain is not typical for diverticulitis, which usually presents on the left side, and the L2 compression fracture does not match the pain location either.  She recalls being prescribed a steroid by an ER doctor, which was later canceled before discharge. She believes that steroids have been effective in managing her back pain and pinched nerves in the past, stating that 'the only thing that helps is  a series of prednisone , the steroids.' She feels that oxycodone  has been ineffective, likening it to 'taking a placebo.'  She has been experiencing significant discomfort, with her  pain level described as an 'eight' but feeling like a 'fifteen.' She has been prescribed antibiotics, pain medication, and medication for abdominal pain and nausea, but reports that none have provided relief. She has not needed to take the anti-nausea medication as she has not felt nauseous.  She has a history of back issues and pinched nerves, for which steroids have been effective in the past.   She is uncertain about the cause of her pain and mentions self-diagnosing possibilities such as kidney stones or appendicitis, but acknowledges that these would likely have been identified on the CT scan. She is concerned about the possibility of a musculoskeletal issue or hip-related pain, as suggested by the location and nature of her symptoms.  No fever, chills, or nausea.    Patient Active Problem List   Diagnosis Date Noted   GERD (gastroesophageal reflux disease) 02/21/2024   Chronic back pain 02/21/2024   Dyspnea 02/21/2024   AKI (acute kidney injury) 02/20/2024   Squamous cell carcinoma of skin 11/11/2023   Recurrent skin cancer 10/02/2023   Genetic testing 08/21/2023   Spondylosis without myelopathy or radiculopathy, cervical region 07/27/2019   Other intervertebral disc degeneration, lumbar region 07/27/2019   Fall from horse 01/27/2012    is allergic to bee pollen, bee venom, codeine, iodinated contrast media, iodine, iohexol, shellfish protein-containing drug products, tape, and wound dressing adhesive.  MEDICAL HISTORY: Past Medical History:  Diagnosis Date   Anxiety    Anxiety    Arthritis    Cancer (HCC) 07/2020   SCC chest   Depression    Fall from horse 01/27/2012   CT chest/Abd/Pelvis done at [facility]. Findings: Nondisplaced fx of posterior right 11th rib   GERD (gastroesophageal reflux disease)    History of radiation therapy    10/28/23-12/05/23- Dr. Lynwood Nasuti (Right Axilla)   Hypertension    Seizures (HCC)    been through testing, no issues with seizures  or epilepsy   Syncopal episodes    seizure with episode   White matter disease     SURGICAL HISTORY: Past Surgical History:  Procedure Laterality Date   AXILLARY LYMPH NODE DISSECTION Right 09/04/2023   Procedure: REDGIE HARD;  Surgeon: Vanderbilt Ned, MD;  Location: [facility];  Service: General;  Laterality: Right;  GEN w/PEC BLOCK   BREAST LUMPECTOMY WITH RADIOACTIVE SEED LOCALIZATION Left 06/21/2020   Procedure: LEFT BREAST LUMPECTOMY WITH RADIOACTIVE SEED LOCALIZATION;  Surgeon: Belinda Cough, MD;  Location: [facility];  Service: General;  Laterality: Left;   BUNIONECTOMY Bilateral 1978   COLONOSCOPY     EYE SURGERY     MELANOMA EXCISION Right 08/02/2020   Procedure: 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;  Surgeon: Belinda Cough, MD;  Location: [facility];  Service: General;  Laterality: Right;  ROOM 8 STARTING AT 04:15PM FOR 45 MIN   SENTINEL NODE BIOPSY Right 09/04/2023   Procedure: BIOPSY, LYMPH NODE, SENTINEL;  Surgeon: Vanderbilt Ned, MD;  Location: [facility];  Service: General;  Laterality: Right;  SEED LOCALIZED RIGHT AXILLARY LYMPHNODE EXCIONSAL BIOPSY    SOCIAL HISTORY: Social History   Socioeconomic History   Marital status: Single    Spouse name: Not on file   Number of children: Not on file   Years of education: Not on file  Highest education level: Not on file  Occupational History   Not on file  Tobacco Use   Smoking status: Former    Current packs/day: 0.00    Types: Cigarettes    Quit date: 05/21/2003    Years since quitting: 20.9   Smokeless tobacco: Never  Vaping Use   Vaping status: Never Used  Substance and Sexual Activity   Alcohol use: Not Currently    Comment: 3-4x per year   Drug use: No   Sexual activity: Not Currently    Birth control/protection: Post-menopausal  Other Topics Concern   Not on file  Social History Narrative   Are you right handed or  left handed? right   Are you currently employed ? no   What is your current occupation? Retired    Do you live at home alone? Alone    Who lives with you? NA    What type of home do you live in: 1 story or 2 story?  1 story        Social Drivers of Corporate Investment Banker Strain: Low Risk  (01/26/2024)   Received from [facility]   Overall Financial Resource Strain (CARDIA)    How hard is it for you to pay for the very basics like food, housing, medical care, and heating?: Not very hard  Food Insecurity: No Food Insecurity (02/20/2024)   Hunger Vital Sign    Worried About Running Out of Food in the Last Year: Never true    Ran Out of Food in the Last Year: Never true  Transportation Needs: No Transportation Needs (02/20/2024)   PRAPARE - Administrator, Civil Service (Medical): No    Lack of Transportation (Non-Medical): No  Physical Activity: Sufficiently Active (01/26/2024)   Received from [facility]   Exercise Vital Sign    On average, how many days per week do you engage in moderate to strenuous exercise (like a brisk walk)?: 5 days    On average, how many minutes do you engage in exercise at this level?: 30 min  Stress: No Stress Concern Present (01/26/2024)   Received from [facility] of Occupational Health - Occupational Stress Questionnaire    Do you feel stress - tense, restless, nervous, or anxious, or unable to sleep at night because your mind is troubled all the time - these days?: Only a little  Social Connections: Unknown (02/20/2024)   Social Connection and Isolation Panel    Frequency of Communication with Friends and Family: Not on file    Frequency of Social Gatherings with Friends and Family: Not on file    Attends Religious Services: Not on file    Active Member of Clubs or Organizations: Not on file    Attends Banker Meetings: Not on file    Marital Status: Never married  Intimate Partner Violence: Not At  Risk (02/20/2024)   Humiliation, Afraid, Rape, and Kick questionnaire    Fear of Current or Ex-Partner: No    Emotionally Abused: No    Physically Abused: No    Sexually Abused: No    FAMILY HISTORY: Family History  Problem Relation Age of Onset   Colon cancer Mother 65       d. 45   Prostate cancer Father 27   Lung cancer Maternal Uncle        dx >50   Cancer Paternal Aunt        unknown type; mets; d. >  50   Diabetes Maternal Grandfather    Diabetes Paternal Grandmother      PHYSICAL EXAMINATION   Onc Performance Status - 04/10/24 1000       KPS SCALE   KPS % SCORE Able to carry on normal activity, minor s/s of disease            Vitals:   04/10/24 1000 04/10/24 1048  BP: (!) 166/91 (!) 160/90  Pulse: 80   Resp: 18   Temp: 98.8 F (37.1 C)   SpO2: 99%     Physical Exam Constitutional:      General: She is not in acute distress.    Appearance: Normal appearance. She is not toxic-appearing.  HENT:     Head: Normocephalic and atraumatic.     Mouth/Throat:     Mouth: Mucous membranes are moist.     Pharynx: Oropharynx is clear. No oropharyngeal exudate or posterior oropharyngeal erythema.  Eyes:     General: No scleral icterus. Cardiovascular:     Rate and Rhythm: Normal rate and regular rhythm.     Pulses: Normal pulses.     Heart sounds: Normal heart sounds.  Pulmonary:     Effort: Pulmonary effort is normal.     Breath sounds: Normal breath sounds.  Chest:     Comments: CTA bilaterally Abdominal:     General: Abdomen is flat. Bowel sounds are normal. There is no distension.     Palpations: Abdomen is soft.     Tenderness: There is no abdominal tenderness.     Comments: No abdominal tenderness, guarding or rigidity. Bowel sounds present and not hyperactive.  Musculoskeletal:        General: No swelling.     Cervical back: Neck supple.     Comments: Some musculoskeletal tenderness left flank  Lymphadenopathy:     Cervical: No cervical  adenopathy.  Skin:    General: Skin is warm and dry.     Findings: No rash.  Neurological:     General: No focal deficit present.     Mental Status: She is alert.  Psychiatric:        Mood and Affect: Mood normal.        Behavior: Behavior normal.     LABORATORY DATA:  CBC    Component Value Date/Time   WBC 4.6 04/10/2024 1024   WBC 4.3 04/06/2024 1359   RBC 4.47 04/10/2024 1024   HGB 12.5 04/10/2024 1024   HCT 37.9 04/10/2024 1024   PLT 308 04/10/2024 1024   MCV 84.8 04/10/2024 1024   MCH 28.0 04/10/2024 1024   MCHC 33.0 04/10/2024 1024   RDW 14.6 04/10/2024 1024   LYMPHSABS 1.2 04/10/2024 1024   MONOABS 0.7 04/10/2024 1024   EOSABS 0.1 04/10/2024 1024   BASOSABS 0.0 04/10/2024 1024    CMP     Component Value Date/Time   NA 144 04/06/2024 1359   K 3.3 (L) 04/06/2024 1359   CL 115 (H) 04/06/2024 1359   CO2 19 (L) 04/06/2024 1359   GLUCOSE 84 04/06/2024 1359   BUN 24 (H) 04/06/2024 1359   CREATININE 1.02 (H) 04/06/2024 1359   CREATININE 1.10 (H) 03/12/2024 1332   CALCIUM  8.6 (L) 04/06/2024 1359   PROT 6.7 04/06/2024 1359   ALBUMIN 3.8 04/06/2024 1359   AST 49 (H) 04/06/2024 1359   AST 24 03/12/2024 1332   ALT 12 04/06/2024 1359   ALT 10 03/12/2024 1332   ALKPHOS 128 (H) 04/06/2024 1359   BILITOT  0.3 04/06/2024 1359   BILITOT 0.3 03/12/2024 1332   GFRNONAA 56 (L) 04/06/2024 1359   GFRNONAA 51 (L) 03/12/2024 1332   GFRAA 43 (L) 06/07/2014 2230     ASSESSMENT and THERAPY PLAN:  Assessment and Plan Assessment & Plan  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 - Discontinue further immunotherapy. - Review surveillance plan, H and P every 2-3 months for a yr, then every 2-4 months for 1 yr, then every 4-6 months for 3 yrs, then every 6-12 months for life. - Continue dermatology follow-ups twice a year.  Acute right lower abdominal and back pain, possible musculoskeletal  origin Severe pain with unclear etiology, possibly musculoskeletal.  Oxycodone  ineffective. - She was wondering if we can prescribe some steroids/muscle relaxers - Advised to monitor response to muscle relaxer and return to ED if no improvement by tomorrow. - She expressed understanding. I dont think this pain is related to her oncologic issues. - I clearly told her that based on her symptoms, I wonder if this is MSK pain especially since she had a recent CT abd/pelvis but we are not certain of this etiology - I dont think there is concern for acute abdomen on my exam, she had no tenderness, guarding or rigidity, BS normal, CBC normal. - She will however continue amoxicillin  prescribed by the ED doc for ? Diverticulitis.  L2 vertebral compression fracture, age indeterminate Fracture may contribute to referred pain but does not fully correlate with current pain presentation.  Essential hypertension Hypertension possibly secondary to pain.  Addendum, tried calling pt about labs, she has elevated alk phosphatase, could this abdominal pain be related to gall bladder. I asked her to go to the ED. She expressed understanding. I asked her to not to take robaxin .  All questions were answered. The patient knows to call the clinic with any problems, questions or concerns. We can certainly see the patient much sooner if necessary.  Total encounter time:40 minutes*in face-to-face visit time, chart review, lab review, care coordination, order entry, and documentation of the encounter time.  *Total Encounter Time as defined by the Centers for Medicare and Medicaid Services includes, in addition to the face-to-face time of a patient visit (documented in the note above) non-face-to-face time: obtaining and reviewing outside history, ordering and reviewing medications, tests or procedures, care coordination (communications with other health care professionals or caregivers) and documentation in the medical  record.

## 2024-04-10 NOTE — ED Provider Notes (Signed)
 [place] [facility]MENT AT [facility] Provider Note   CSN: [id number] Arrival date & time: 04/10/24  1354     Patient presents with: Abdominal Pain   Marcia Harvey is a 78 y.o. female.   Pt is a 78 yo female with pmhx significant for htn, anxiety, skin cancer.  Pt did have a skin cancer met to the breast, but not primary breast cancer.  Pt has been having low back pain and RLQ abd pain for the past few days.  She was seen in the ED on 11/24 and had a CT scan which showed a L2 compression fx and diverticulitis.  She was d/c with percocet and robaxin  and augmentin .  She said her pain has not been controlled.  She followed up with her oncologist today who did labs which showed mild elevation in LFTs and AP, so she was sent here for further eval.  No f/c.  No n/v.         Prior to Admission medications   Medication Sig Start Date End Date Taking? Authorizing Provider  gabapentin  (NEURONTIN ) 300 MG capsule Take 1 capsule (300 mg total) by mouth at bedtime. 04/10/24  Yes Dean Clarity, MD  predniSONE  (STERAPRED UNI-PAK 21 TAB) 10 MG (21) TBPK tablet Take by mouth daily. Take 6 tabs by mouth daily  for 2 days, then 5 tabs for 2 days, then 4 tabs for 2 days, then 3 tabs for 2 days, 2 tabs for 2 days, then 1 tab by mouth daily for 2 days 04/10/24  Yes Dean Clarity, MD  amLODipine  (NORVASC ) 2.5 MG tablet Take 1 tablet (2.5 mg total) by mouth daily. 02/23/24   Rai, Nydia POUR, MD  amoxicillin -clavulanate (AUGMENTIN ) 875-125 MG tablet Take 1 tablet by mouth every 12 (twelve) hours. 04/06/24   Dreama Longs, MD  ascorbic acid (VITAMIN C) 500 MG tablet Take 500 mg by mouth daily.    [provider]  atorvastatin  (LIPITOR) 40 MG tablet Take 40 mg by mouth daily.    [provider]  augmented betamethasone dipropionate (DIPROLENE-AF) 0.05 % cream Apply 1 application  topically 2 (two) times daily as needed (for itching/irritation).    [provider]  b complex vitamins tablet Take 1 tablet by mouth daily.    [provider]  Cholecalciferol (VITAMIN D3) 2000 UNITS TABS Take 2,000 Units by mouth daily.    [provider]  dicyclomine  (BENTYL ) 20 MG tablet Take 1 tablet (20 mg total) by mouth 2 (two) times daily. 04/06/24   Dreama Longs, MD  methocarbamol  (ROBAXIN ) 500 MG tablet Take 1 tablet (500 mg total) by mouth every 8 (eight) hours as needed for muscle spasms. 04/10/24   Iruku, Praveena, MD  omeprazole (PRILOSEC) 40 MG capsule Take 40 mg by mouth daily before breakfast. 10/12/16   [provider]  ondansetron  (ZOFRAN -ODT) 4 MG disintegrating tablet Take 1 tablet (4 mg total) by mouth every 8 (eight) hours as needed for nausea or vomiting. 04/06/24   Dreama Longs, MD  oxyCODONE  (ROXICODONE ) 5 MG immediate release tablet Take 1 tablet (5 mg total) by mouth every 4 (four) hours as needed for severe pain (pain score 7-10). 04/06/24   Dreama Longs, MD  sertraline  (ZOLOFT ) 25 MG tablet Take 25 mg by mouth See admin instructions. Take 25 mg by mouth in the morning and wean as tolerated 06/22/16   [provider]  SYSTANE HYDRATION PF 0.4-0.3 % SOLN Place 1 drop into both eyes See  admin instructions. Instill 1 drop into both eyes four to six times a day    [provider]  temazepam  (RESTORIL ) 7.5 MG capsule Take 7.5 mg by mouth at bedtime as needed for sleep.    [provider]  traMADol  (ULTRAM ) 50 MG tablet Take 1 tablet (50 mg total) by mouth every 12 (twelve) hours as needed. 01/14/24   Iruku, Praveena, MD    Allergies: Bee pollen, Bee venom, Codeine, Iodinated contrast media, Iodine, Iohexol, Shellfish protein-containing drug products, Tape, and Wound dressing adhesive    Review of Systems  Gastrointestinal:  Positive for abdominal pain.  Musculoskeletal:  Positive for back pain.  All other systems reviewed and are negative.   Updated Vital Signs BP (!) 146/86   Pulse 82    Temp 98.3 F (36.8 C) (Oral)   Resp 16   SpO2 96%   Physical Exam Vitals and nursing note reviewed.  Constitutional:      Appearance: She is well-developed.  HENT:     Head: Normocephalic and atraumatic.     Mouth/Throat:     Mouth: Mucous membranes are moist.     Pharynx: Oropharynx is clear.  Eyes:     Extraocular Movements: Extraocular movements intact.     Comments: Left pupil chronically dilated s/p cataract surgery  Cardiovascular:     Rate and Rhythm: Normal rate and regular rhythm.     Heart sounds: Normal heart sounds.  Pulmonary:     Effort: Pulmonary effort is normal.     Breath sounds: Normal breath sounds.  Abdominal:     General: Abdomen is flat. Bowel sounds are normal.     Palpations: Abdomen is soft.     Tenderness: There is abdominal tenderness in the right lower quadrant.  Skin:    General: Skin is warm.     Capillary Refill: Capillary refill takes less than 2 seconds.  Neurological:     General: No focal deficit present.     Mental Status: She is alert and oriented to person, place, and time.  Psychiatric:        Mood and Affect: Mood normal.        Behavior: Behavior normal.     (all labs ordered are listed, but only abnormal results are displayed) Labs Reviewed - No data to display  EKG: None  Radiology: MR Lumbar Spine W Wo Contrast Result Date: 04/10/2024 EXAM: MRI LUMBAR SPINE 04/10/2024 05:38:00 PM TECHNIQUE: Multiplanar multisequence MRI of the lumbar spine was performed with and without the administration of intravenous contrast. COMPARISON: None available. CLINICAL HISTORY: Metastatic disease evaluation. FINDINGS: BONES AND ALIGNMENT: There is straightening of the normal lumbar lordosis. Normal vertebral body heights are maintained except for L2. There is irregularity of the L2 superior endplate with associated edema and enhancement with up to 30% height loss most pronounced along the right anterior aspect, finding favored to reflect an  osteoporotic fracture. There is no evidence of enhancing lesion within the lumbar spine to suggest osseous metastatic disease. Small hemangioma in the T12 vertebral body. Degenerative endplate changes at multiple levels most pronounced at L2-L3 through L4-L5. SPINAL CORD: The conus medullaris extends to the L1-L2 level. SOFT TISSUES: No paraspinal mass. There is additional edema and mild enhancement in the soft tissues adjacent to the right facet at L5-S1, likely related to facet arthrosis. Additionally, there is a cystic focus within this region compatible with a synovial cyst which projects into the posterior paraspinal soft tissues. T12-L1: Paracentral at T12-L1 there  is mild disc height loss, diffuse disc bulge, and mild facet arthrosis. No significant spinal canal stenosis or foraminal stenosis. L1-L2: At L1-L2 there is mild disc height loss posteriorly, diffuse disc bulge, additional left subarticular disc protrusion resulting in lateral recess narrowing with mild impingement of the traversing left L2 nerve root, and mild facet arthrosis. No significant spinal canal or foraminal stenosis. L2-L3: At L2-L3 there is moderate disc height loss, diffuse disc bulge resulting in mild lateral recess narrowing, mild to moderate facet arthrosis, and thickening of the ligamentum flavum. There is mild spinal canal stenosis and mild foraminal stenosis on the left. L3-L4: At L3-L4 there is moderate disc height loss, diffuse disc bulge resulting in lateral recess narrowing. There is potential for impingement of the traversing L4 nerve roots more pronounced on the right. Mild to moderate facet arthrosis. There is mild spinal canal stenosis and mild bilateral foraminal stenosis greater on the right. L4-L5: At L4-L5 there is moderate disc height loss, diffuse disc bulge resulting in lateral recess narrowing, moderate facet arthrosis, and thickening of the ligamentum flavum. Mild spinal canal stenosis. There is moderate right and  mild left foraminal stenosis. L5-S1: At L5-S1 there is mild disc height loss, diffuse disc bulge, moderate right and severe left facet arthrosis, and a small facet effusion on the right. No significant spinal canal stenosis. There is mild right and moderate to severe left foraminal stenosis. Disc bulge contacts and deforms the exiting left L5 nerve root. IMPRESSION: 1. Irregularity of the L2 superior endplate with associated edema and enhancement, up to 30% height loss, most pronounced along the right anterior aspect, favor acute/subacute osteoporotic fracture. 2. No significant retropulsion. 3. No evidence of enhancing lesion within the lumbar spine to suggest osseous metastatic disease. 4. Degenerative changes at multiple levels as detailed above. Moderate to severe foraminal stenosis on the left at L5-S1 with disc bulge contacting and deforming the exiting left L5 nerve root. Electronically signed by: Donnice Mania MD 04/10/2024 06:39 PM EST RP Workstation: [id number]   US  Abdomen Limited RUQ (LIVER/GB) Result Date: 04/10/2024 CLINICAL DATA:  151471 RUQ pain 151471 EXAM: ULTRASOUND ABDOMEN LIMITED RIGHT UPPER QUADRANT COMPARISON:  April 06, 2024 FINDINGS: Gallbladder: No gallstones. No wall thickening or pericholecystic fluid. No sonographic Murphy's sign noted by sonographer. Common bile duct: Diameter: 4 mm Liver: Normal echogenicity. No focal lesion identified. No intrahepatic biliary ductal dilation. Portal vein is patent on color Doppler imaging with normal direction of blood flow towards the liver. Right Kidney: Partially visualized. No mass. No hydronephrosis or nephrolithiasis. Other: None. IMPRESSION: No cholecystolithiasis or changes of acute cholecystitis. Electronically Signed   By: Rogelia Myers M.D.   On: 04/10/2024 17:22     Procedures   Medications Ordered in the ED  HYDROmorphone  (DILAUDID ) injection 1 mg (1 mg Intravenous Given 04/10/24 1628)  ondansetron  (ZOFRAN ) injection 4 mg  (4 mg Intravenous Given 04/10/24 1627)  gadobutrol (GADAVIST) 1 MMOL/ML injection 7.5 mL (7.5 mLs Intravenous Contrast Given 04/10/24 1732)  HYDROmorphone  (DILAUDID ) injection 1 mg (1 mg Intravenous Given 04/10/24 1900)  dexamethasone  (DECADRON ) injection 10 mg (10 mg Intravenous Given 04/10/24 1934)                                    Medical Decision Making Amount and/or Complexity of Data Reviewed Radiology: ordered.  Risk Prescription drug management.   This patient presents to the ED for concern of abd pain  and back pain, this involves an extensive number of treatment options, and is a complaint that carries with it a high risk of complications and morbidity.  The differential diagnosis includes nerve compression, cholelithiasis   Co morbidities that complicate the patient evaluation   htn, anxiety, breast cancer, and skin cancer   Additional history obtained:  Additional history obtained from epic chart review External records from outside source obtained and reviewed including family   Lab Tests:  Labs from this am (10:24):  CBC nl; CMP with bun 26, cr 1.13 (stable);  AST sl elevated at 56, AP sl elevated at 161 (nl in Oct)   Imaging Studies ordered:  I ordered imaging studies including GB US , MRI Lumbar  I independently visualized and interpreted imaging which showed  GB US : No cholecystolithiasis or changes of acute cholecystitis.  MRI Lumbar: 1. Irregularity of the L2 superior endplate with associated edema and enhancement, up to 30% height loss, most pronounced along the right anterior aspect, favor acute/subacute osteoporotic fracture. 2. No significant retropulsion. 3. No evidence of enhancing lesion within the lumbar spine to suggest osseous metastatic disease. 4. Degenerative changes at multiple levels as detailed above. Moderate to severe foraminal stenosis on the left at L5-S1 with disc bulge contacting and deforming the exiting left L5 nerve  root. I agree with the radiologist interpretation   Cardiac Monitoring:  The patient was maintained on a cardiac monitor.  I personally viewed and interpreted the cardiac monitored which showed an underlying rhythm of: nsr   Medicines ordered and prescription drug management:  I ordered medication including dilaudid /zofran   for sx  Reevaluation of the patient after these medicines showed that the patient improved I have reviewed the patients home medicines and have made adjustments as needed   Test Considered:  Mri/us    Critical Interventions:  Pain control   Problem List / ED Course:  L2 compression fracture:  pain much improved with the pain meds and LSO brace L5 spinal stenosis:  pt said prednisone  has helped her in the past and so has neurontin .  I will give her rx for both.  Pt is able to ambulate.  She is stable for d/c.  Return if worse.    Reevaluation:  After the interventions noted above, I reevaluated the patient and found that they have :improved   Social Determinants of Health:  Lives at home   Dispostion:  After consideration of the diagnostic results and the patients response to treatment, I feel that the patent would benefit from discharge with outpatient f/u.       Final diagnoses:  Closed compression fracture of L2 lumbar vertebra, initial encounter [facility])  Spinal stenosis of lumbar region without neurogenic claudication    ED Discharge Orders          Ordered    gabapentin  (NEURONTIN ) 300 MG capsule  Daily at bedtime        04/10/24 1939    predniSONE  (STERAPRED UNI-PAK 21 TAB) 10 MG (21) TBPK tablet  Daily        04/10/24 1939               Dean Clarity, MD 04/10/24 2045

## 2024-04-10 NOTE — ED Notes (Signed)
Pt ambulatory in ED lobby. 

## 2024-04-10 NOTE — Progress Notes (Signed)
 Orthopedic Tech Progress Note Patient Details:  Marcia Harvey 02-09-1946 [id number]  Called in order to [facility] for a STAT LSO BRACE  Patient ID: Marcia Harvey, female   DOB: [birth year], 78 y.o.   MRN: [id number]  Marcia Harvey Pac 04/10/2024, 7:04 PM

## 2024-04-10 NOTE — ED Triage Notes (Addendum)
 Pt arriving POV with RLQ and lower back pain. Saw her oncologist today who suggested additional imaging. Pt had extensive blood work done this morning with oncologist. Not currently receiving chemo or radiation.

## 2024-04-10 NOTE — ED Notes (Signed)
 Pt taken to MRI

## 2024-04-13 ENCOUNTER — Ambulatory Visit
Admission: RE | Admit: 2024-04-13 | Discharge: 2024-04-13 | Disposition: A | Source: Ambulatory Visit | Attending: Hematology and Oncology

## 2024-04-13 ENCOUNTER — Other Ambulatory Visit (HOSPITAL_COMMUNITY)
Admission: RE | Admit: 2024-04-13 | Discharge: 2024-04-13 | Disposition: A | Source: Ambulatory Visit | Attending: Radiology | Admitting: Radiology

## 2024-04-13 DIAGNOSIS — E041 Nontoxic single thyroid nodule: Secondary | ICD-10-CM | POA: Insufficient documentation

## 2024-04-14 NOTE — Progress Notes (Signed)
 Cardiology Office Note:  .   Date:  04/14/2024  ID:  Marcia Harvey, DOB 1945-11-27, MRN [id number] PCP: [name], MD  [facility] Providers Cardiologist:  None {  History of Present Illness: .   Marcia Harvey is a 78 y.o. female with PMH metastatic squamous cell carcinoma s/p immunotherapy, hypertension, hyperlipidemia who is seen as a new patient today. Referral from 02/06/24 reviewed. Notes comment on mild persistent shortness of breath. CT scan showed scattered pulmonary micronodules and coronary artery calcification, as well as aortic atherosclerosis. Referred to cardiology for further evaluation.  Cardiovascular risk factors: Prior clinical ASCVD: none Comorbid conditions: hypertension on lisinopril -hctz, hyperlipidemia on atorvastatin . Denies diabetes, chronic kidney disease  Tobacco use history: former, quit 2005  Discussed calcium , cholesterol, recommendations at length, see below.  Notes shortness of breath after immunotherapy. Told it might take months to recover. Slowly getting better. Feels like her breathing is shallow at rest, feels that shortness of breath on exertion is slowly improving. Reviewed her echo from 02/2024.  Has rare intermittent palpitations, most noticeable in the evening, lasts less than a minute, feels like her heart skipping/resetting.   ROS: Denies chest pain. No PND, orthopnea, LE edema or unexpected weight gain. No syncope. ROS otherwise negative except as noted.   Studies Reviewed: SABRA    EKG:       Physical Exam:   VS:  There were no vitals taken for this visit.   Wt Readings from Last 3 Encounters:  04/10/24 168 lb 12.8 oz (76.6 kg)  03/12/24 170 lb 8 oz (77.3 kg)  01/31/24 170 lb 8 oz (77.3 kg)    GEN: Well nourished, well developed in no acute distress HEENT: Normal, moist mucous membranes NECK: No JVD CARDIAC: regular rhythm, normal S1 and S2, no rubs or gallops. No murmur. VASCULAR: Radial and DP pulses 2+ bilaterally. No  carotid bruits RESPIRATORY:  Clear to auscultation without rales, wheezing or rhonchi  ABDOMEN: Soft, non-tender, non-distended MUSCULOSKELETAL:  Ambulates independently SKIN: Warm and dry, no edema NEUROLOGIC:  Alert and oriented x 3. No focal neuro deficits noted. PSYCHIATRIC:  Normal affect    ASSESSMENT AND PLAN: .    Coronary artery calcification, consistent with nonobstructive CAD Aortic atherosclerosis -due for lipids in March 2026. Last LDL 92 (care everywhere) 07/2022 -on atorvastatin  40 mg daily. LDL goal <70 -discussed options. After shared decision making, will increase atorvastatin  to 80 mg now. She has follow up with her PCP in March. If LDL not <70, would add ezetimibe 10 mg daily. We discussed this today.  Shortness of breath  Palpitations  CV risk counseling and prevention -recommend heart healthy/Mediterranean diet, with whole grains, fruits, vegetable, fish, lean meats, nuts, and olive oil. Limit salt. -recommend moderate walking, 3-5 times/week for 30-50 minutes each session. Aim for at least 150 minutes/week. Goal should be pace of 3 miles/hours, or walking 1.5 miles in 30 minutes -recommend avoidance of tobacco products. Avoid excess alcohol. -ASCVD risk score: The 10-year ASCVD risk score (Arnett DK, et al., 2019) is: 40.7%   Values used to calculate the score:     Age: 44 years     Clincally relevant sex: Female     Is Non-Hispanic African American: No     Diabetic: No     Tobacco smoker: No     Systolic Blood Pressure: 150 mmHg     Is BP treated: Yes     HDL Cholesterol: 56 mg/dL     Total Cholesterol:  161 mg/dL    Dispo: ***  Signed, Shelda Bruckner, MD   Shelda Bruckner, MD, PhD, [facility] [place]  [facility]  [facility] at [facility] at [facility] [address] [place], [place] [address] [phone]

## 2024-04-15 ENCOUNTER — Encounter (HOSPITAL_BASED_OUTPATIENT_CLINIC_OR_DEPARTMENT_OTHER): Payer: Self-pay | Admitting: Cardiology

## 2024-04-15 ENCOUNTER — Ambulatory Visit (HOSPITAL_BASED_OUTPATIENT_CLINIC_OR_DEPARTMENT_OTHER): Admitting: Cardiology

## 2024-04-15 ENCOUNTER — Ambulatory Visit: Payer: Self-pay | Admitting: Hematology and Oncology

## 2024-04-15 VITALS — BP 148/80 | HR 82 | Ht 63.25 in | Wt 170.0 lb

## 2024-04-15 DIAGNOSIS — E7800 Pure hypercholesterolemia, unspecified: Secondary | ICD-10-CM | POA: Diagnosis not present

## 2024-04-15 DIAGNOSIS — I2510 Atherosclerotic heart disease of native coronary artery without angina pectoris: Secondary | ICD-10-CM

## 2024-04-15 DIAGNOSIS — I700 Atherosclerosis of aorta: Secondary | ICD-10-CM

## 2024-04-15 DIAGNOSIS — R002 Palpitations: Secondary | ICD-10-CM

## 2024-04-15 DIAGNOSIS — I10 Essential (primary) hypertension: Secondary | ICD-10-CM

## 2024-04-15 DIAGNOSIS — R0602 Shortness of breath: Secondary | ICD-10-CM | POA: Diagnosis not present

## 2024-04-15 LAB — CYTOLOGY - NON PAP

## 2024-04-15 MED ORDER — ATORVASTATIN CALCIUM 80 MG PO TABS: 80.0000 mg | ORAL_TABLET | Freq: Every day | ORAL | 3 refills | Status: AC

## 2024-04-15 NOTE — Patient Instructions (Signed)
 Medication Instructions:  Your physician has recommended you make the following change in your medication:  1.) increase atorvastatin  (Lipitor) to 80 mg - take one tablet daily  *If you need a refill on your cardiac medications before your next appointment, please call your pharmacy*  Lab Work: By PCP in March -- please provide PCP w this  fax number: [phone]   Testing/Procedures: none  Follow-Up: At [facility], you and your health needs are our priority.  As part of our continuing mission to provide you with exceptional heart care, our providers are all part of one team.  This team includes your primary Cardiologist (physician) and Advanced Practice Providers or APPs (Physician Assistants and Nurse Practitioners) who all work together to provide you with the care you need, when you need it.  Your next appointment:   6 month(s)  Provider:   Shelda Bruckner, MD, Rosaline Bane, NP, or Reche Finder, NP

## 2024-05-01 ENCOUNTER — Inpatient Hospital Stay: Admitting: Adult Health

## 2024-05-01 ENCOUNTER — Inpatient Hospital Stay

## 2024-06-08 ENCOUNTER — Ambulatory Visit

## 2024-06-15 ENCOUNTER — Ambulatory Visit

## 2024-06-30 ENCOUNTER — Ambulatory Visit: Attending: Surgery

## 2024-10-15 ENCOUNTER — Ambulatory Visit (HOSPITAL_BASED_OUTPATIENT_CLINIC_OR_DEPARTMENT_OTHER): Admitting: Cardiology
